# Patient Record
Sex: Male | Born: 1945 | Race: Black or African American | Hispanic: No | Marital: Single | Smoking: Former smoker
Health system: Southern US, Community
[De-identification: ages and names within clinical notes are randomized; demographics above are authoritative.]

## PROBLEM LIST (undated history)

## (undated) DIAGNOSIS — E119 Type 2 diabetes mellitus without complications: Secondary | ICD-10-CM

## (undated) DIAGNOSIS — E1165 Type 2 diabetes mellitus with hyperglycemia: Secondary | ICD-10-CM

## (undated) DIAGNOSIS — Z955 Presence of coronary angioplasty implant and graft: Secondary | ICD-10-CM

## (undated) DIAGNOSIS — M109 Gout, unspecified: Secondary | ICD-10-CM

## (undated) DIAGNOSIS — I2510 Atherosclerotic heart disease of native coronary artery without angina pectoris: Secondary | ICD-10-CM

## (undated) DIAGNOSIS — Z9861 Coronary angioplasty status: Secondary | ICD-10-CM

## (undated) DIAGNOSIS — N183 Chronic kidney disease, stage 3 (moderate): Secondary | ICD-10-CM

## (undated) DIAGNOSIS — K5792 Diverticulitis of intestine, part unspecified, without perforation or abscess without bleeding: Secondary | ICD-10-CM

## (undated) DIAGNOSIS — I213 ST elevation (STEMI) myocardial infarction of unspecified site: Secondary | ICD-10-CM

## (undated) DIAGNOSIS — I519 Heart disease, unspecified: Secondary | ICD-10-CM

## (undated) DIAGNOSIS — I10 Essential (primary) hypertension: Secondary | ICD-10-CM

## (undated) DIAGNOSIS — Z77098 Contact with and (suspected) exposure to other hazardous, chiefly nonmedicinal, chemicals: Secondary | ICD-10-CM

## (undated) HISTORY — PX: COLON SURGERY: SHX602

## (undated) HISTORY — PX: KNEE SURGERY: SHX244

---

## 1998-08-08 ENCOUNTER — Emergency Department (HOSPITAL_COMMUNITY): Admission: EM | Admit: 1998-08-08 | Discharge: 1998-08-08 | Payer: Self-pay | Admitting: Emergency Medicine

## 1998-08-13 ENCOUNTER — Emergency Department (HOSPITAL_COMMUNITY): Admission: EM | Admit: 1998-08-13 | Discharge: 1998-08-13 | Payer: Self-pay | Admitting: Emergency Medicine

## 1998-10-05 ENCOUNTER — Inpatient Hospital Stay (HOSPITAL_COMMUNITY): Admission: EM | Admit: 1998-10-05 | Discharge: 1998-10-15 | Payer: Self-pay | Admitting: Emergency Medicine

## 1998-10-05 ENCOUNTER — Encounter: Payer: Self-pay | Admitting: *Deleted

## 1998-12-18 ENCOUNTER — Encounter: Payer: Self-pay | Admitting: Family Medicine

## 1998-12-18 ENCOUNTER — Ambulatory Visit (HOSPITAL_COMMUNITY): Admission: RE | Admit: 1998-12-18 | Discharge: 1998-12-18 | Payer: Self-pay | Admitting: Family Medicine

## 1999-01-24 ENCOUNTER — Encounter: Payer: Self-pay | Admitting: Family Medicine

## 1999-01-24 ENCOUNTER — Ambulatory Visit (HOSPITAL_COMMUNITY): Admission: RE | Admit: 1999-01-24 | Discharge: 1999-01-24 | Payer: Self-pay | Admitting: Family Medicine

## 1999-03-18 ENCOUNTER — Ambulatory Visit (HOSPITAL_COMMUNITY): Admission: RE | Admit: 1999-03-18 | Discharge: 1999-03-18 | Payer: Self-pay | Admitting: Family Medicine

## 1999-03-18 ENCOUNTER — Encounter: Payer: Self-pay | Admitting: Family Medicine

## 1999-09-20 ENCOUNTER — Encounter: Payer: Self-pay | Admitting: Surgery

## 1999-09-23 ENCOUNTER — Inpatient Hospital Stay (HOSPITAL_COMMUNITY): Admission: RE | Admit: 1999-09-23 | Discharge: 1999-09-30 | Payer: Self-pay | Admitting: Surgery

## 1999-09-27 ENCOUNTER — Encounter: Payer: Self-pay | Admitting: Surgery

## 2000-03-08 ENCOUNTER — Emergency Department (HOSPITAL_COMMUNITY): Admission: EM | Admit: 2000-03-08 | Discharge: 2000-03-08 | Payer: Self-pay | Admitting: Emergency Medicine

## 2000-09-21 ENCOUNTER — Emergency Department (HOSPITAL_COMMUNITY): Admission: EM | Admit: 2000-09-21 | Discharge: 2000-09-21 | Payer: Self-pay | Admitting: Emergency Medicine

## 2001-04-19 ENCOUNTER — Emergency Department (HOSPITAL_COMMUNITY): Admission: EM | Admit: 2001-04-19 | Discharge: 2001-04-19 | Payer: Self-pay | Admitting: Emergency Medicine

## 2001-10-03 ENCOUNTER — Emergency Department (HOSPITAL_COMMUNITY): Admission: EM | Admit: 2001-10-03 | Discharge: 2001-10-03 | Payer: Self-pay | Admitting: Emergency Medicine

## 2002-03-23 ENCOUNTER — Ambulatory Visit (HOSPITAL_COMMUNITY): Admission: RE | Admit: 2002-03-23 | Discharge: 2002-03-23 | Payer: Self-pay | Admitting: Internal Medicine

## 2002-03-23 ENCOUNTER — Encounter: Payer: Self-pay | Admitting: Internal Medicine

## 2002-05-25 ENCOUNTER — Encounter: Admission: RE | Admit: 2002-05-25 | Discharge: 2002-07-01 | Payer: Self-pay | Admitting: Orthopedic Surgery

## 2003-02-28 ENCOUNTER — Emergency Department (HOSPITAL_COMMUNITY): Admission: EM | Admit: 2003-02-28 | Discharge: 2003-02-28 | Payer: Self-pay | Admitting: Emergency Medicine

## 2013-07-05 ENCOUNTER — Encounter (HOSPITAL_COMMUNITY)
Admission: EM | Disposition: A | Discharge status: Home / Self Care | Payer: Self-pay | Source: Home / Self Care | Attending: Cardiovascular Disease

## 2013-07-05 ENCOUNTER — Encounter (HOSPITAL_BASED_OUTPATIENT_CLINIC_OR_DEPARTMENT_OTHER): Payer: Self-pay | Admitting: *Deleted

## 2013-07-05 ENCOUNTER — Inpatient Hospital Stay (HOSPITAL_BASED_OUTPATIENT_CLINIC_OR_DEPARTMENT_OTHER)
Admission: EM | Admit: 2013-07-05 | Discharge: 2013-07-14 | DRG: 246 | Disposition: A | Discharge status: Home / Self Care | Payer: Non-veteran care | Attending: Cardiovascular Disease | Admitting: Cardiovascular Disease

## 2013-07-05 ENCOUNTER — Emergency Department (HOSPITAL_BASED_OUTPATIENT_CLINIC_OR_DEPARTMENT_OTHER): Payer: Non-veteran care

## 2013-07-05 DIAGNOSIS — Z77098 Contact with and (suspected) exposure to other hazardous, chiefly nonmedicinal, chemicals: Secondary | ICD-10-CM

## 2013-07-05 DIAGNOSIS — I10 Essential (primary) hypertension: Secondary | ICD-10-CM | POA: Diagnosis present

## 2013-07-05 DIAGNOSIS — IMO0001 Reserved for inherently not codable concepts without codable children: Secondary | ICD-10-CM | POA: Diagnosis present

## 2013-07-05 DIAGNOSIS — I252 Old myocardial infarction: Secondary | ICD-10-CM

## 2013-07-05 DIAGNOSIS — I200 Unstable angina: Secondary | ICD-10-CM

## 2013-07-05 DIAGNOSIS — I519 Heart disease, unspecified: Secondary | ICD-10-CM

## 2013-07-05 DIAGNOSIS — M479 Spondylosis, unspecified: Secondary | ICD-10-CM | POA: Diagnosis present

## 2013-07-05 DIAGNOSIS — I255 Ischemic cardiomyopathy: Secondary | ICD-10-CM | POA: Diagnosis present

## 2013-07-05 DIAGNOSIS — Z955 Presence of coronary angioplasty implant and graft: Secondary | ICD-10-CM

## 2013-07-05 DIAGNOSIS — I5021 Acute systolic (congestive) heart failure: Secondary | ICD-10-CM | POA: Diagnosis present

## 2013-07-05 DIAGNOSIS — Z9189 Other specified personal risk factors, not elsewhere classified: Secondary | ICD-10-CM | POA: Diagnosis present

## 2013-07-05 DIAGNOSIS — Z87891 Personal history of nicotine dependence: Secondary | ICD-10-CM

## 2013-07-05 DIAGNOSIS — I236 Thrombosis of atrium, auricular appendage, and ventricle as current complications following acute myocardial infarction: Secondary | ICD-10-CM | POA: Clinically undetermined

## 2013-07-05 DIAGNOSIS — I2109 ST elevation (STEMI) myocardial infarction involving other coronary artery of anterior wall: Principal | ICD-10-CM

## 2013-07-05 DIAGNOSIS — E1165 Type 2 diabetes mellitus with hyperglycemia: Secondary | ICD-10-CM

## 2013-07-05 DIAGNOSIS — N183 Chronic kidney disease, stage 3 unspecified: Secondary | ICD-10-CM | POA: Diagnosis present

## 2013-07-05 DIAGNOSIS — Z7982 Long term (current) use of aspirin: Secondary | ICD-10-CM

## 2013-07-05 DIAGNOSIS — I2510 Atherosclerotic heart disease of native coronary artery without angina pectoris: Secondary | ICD-10-CM

## 2013-07-05 DIAGNOSIS — I2589 Other forms of chronic ischemic heart disease: Secondary | ICD-10-CM | POA: Diagnosis present

## 2013-07-05 DIAGNOSIS — I4949 Other premature depolarization: Secondary | ICD-10-CM | POA: Diagnosis not present

## 2013-07-05 DIAGNOSIS — Z79899 Other long term (current) drug therapy: Secondary | ICD-10-CM

## 2013-07-05 DIAGNOSIS — I213 ST elevation (STEMI) myocardial infarction of unspecified site: Secondary | ICD-10-CM

## 2013-07-05 DIAGNOSIS — I129 Hypertensive chronic kidney disease with stage 1 through stage 4 chronic kidney disease, or unspecified chronic kidney disease: Secondary | ICD-10-CM | POA: Diagnosis present

## 2013-07-05 DIAGNOSIS — I249 Acute ischemic heart disease, unspecified: Secondary | ICD-10-CM

## 2013-07-05 DIAGNOSIS — Z23 Encounter for immunization: Secondary | ICD-10-CM

## 2013-07-05 DIAGNOSIS — M109 Gout, unspecified: Secondary | ICD-10-CM

## 2013-07-05 DIAGNOSIS — E663 Overweight: Secondary | ICD-10-CM | POA: Diagnosis present

## 2013-07-05 DIAGNOSIS — I5189 Other ill-defined heart diseases: Secondary | ICD-10-CM | POA: Diagnosis present

## 2013-07-05 DIAGNOSIS — I219 Acute myocardial infarction, unspecified: Secondary | ICD-10-CM

## 2013-07-05 DIAGNOSIS — Z7901 Long term (current) use of anticoagulants: Secondary | ICD-10-CM

## 2013-07-05 DIAGNOSIS — Z794 Long term (current) use of insulin: Secondary | ICD-10-CM

## 2013-07-05 DIAGNOSIS — IMO0002 Reserved for concepts with insufficient information to code with codable children: Secondary | ICD-10-CM

## 2013-07-05 DIAGNOSIS — R079 Chest pain, unspecified: Secondary | ICD-10-CM

## 2013-07-05 DIAGNOSIS — M1909 Primary osteoarthritis, other specified site: Secondary | ICD-10-CM | POA: Diagnosis present

## 2013-07-05 DIAGNOSIS — Z7739 Contact with and (suspected) exposure to other war theater: Secondary | ICD-10-CM

## 2013-07-05 HISTORY — DX: Heart disease, unspecified: I51.9

## 2013-07-05 HISTORY — DX: Essential (primary) hypertension: I10

## 2013-07-05 HISTORY — DX: ST elevation (STEMI) myocardial infarction of unspecified site: I21.3

## 2013-07-05 HISTORY — DX: Presence of coronary angioplasty implant and graft: Z95.5

## 2013-07-05 HISTORY — DX: Atherosclerotic heart disease of native coronary artery without angina pectoris: I25.10

## 2013-07-05 HISTORY — DX: Type 2 diabetes mellitus with hyperglycemia: E11.65

## 2013-07-05 HISTORY — PX: LEFT HEART CATHETERIZATION WITH CORONARY ANGIOGRAM: SHX5451

## 2013-07-05 HISTORY — DX: Contact with and (suspected) exposure to other hazardous, chiefly nonmedicinal, chemicals: Z77.098

## 2013-07-05 HISTORY — DX: Diverticulitis of intestine, part unspecified, without perforation or abscess without bleeding: K57.92

## 2013-07-05 HISTORY — DX: Gout, unspecified: M10.9

## 2013-07-05 HISTORY — DX: Type 2 diabetes mellitus without complications: E11.9

## 2013-07-05 HISTORY — DX: Reserved for concepts with insufficient information to code with codable children: IMO0002

## 2013-07-05 HISTORY — DX: Chronic kidney disease, stage 3 (moderate): N18.3

## 2013-07-05 HISTORY — PX: CORONARY ANGIOPLASTY WITH STENT PLACEMENT: SHX49

## 2013-07-05 HISTORY — DX: Coronary angioplasty status: Z98.61

## 2013-07-05 LAB — CBC
MCHC: 33.2 g/dL (ref 30.0–36.0)
Platelets: 184 10*3/uL (ref 150–400)
RDW: 13.5 % (ref 11.5–15.5)
WBC: 5.9 10*3/uL (ref 4.0–10.5)

## 2013-07-05 LAB — PROTIME-INR: Prothrombin Time: 13 seconds (ref 11.6–15.2)

## 2013-07-05 LAB — HEMOGLOBIN A1C
Hgb A1c MFr Bld: 9.9 % — ABNORMAL HIGH (ref <5.7)
Mean Plasma Glucose: 237 mg/dL — ABNORMAL HIGH (ref <117)

## 2013-07-05 LAB — GLUCOSE, CAPILLARY
Glucose-Capillary: 256 mg/dL — ABNORMAL HIGH (ref 70–99)
Glucose-Capillary: 263 mg/dL — ABNORMAL HIGH (ref 70–99)

## 2013-07-05 LAB — TROPONIN I
Troponin I: >20 ng/mL (ref <0.30)
Troponin I: >20 ng/mL (ref <0.30)
Troponin I: >20 ng/mL (ref <0.30)

## 2013-07-05 LAB — COMPREHENSIVE METABOLIC PANEL
AST: 17 U/L (ref 0–37)
Albumin: 4 g/dL (ref 3.5–5.2)
Alkaline Phosphatase: 122 U/L — ABNORMAL HIGH (ref 39–117)
BUN: 20 mg/dL (ref 6–23)
Chloride: 106 mEq/L (ref 96–112)
Potassium: 3.4 mEq/L — ABNORMAL LOW (ref 3.5–5.1)
Sodium: 141 mEq/L (ref 135–145)
Total Protein: 7.8 g/dL (ref 6.0–8.3)

## 2013-07-05 LAB — CK TOTAL AND CKMB (NOT AT ARMC)
CK, MB: 239.7 ng/mL (ref 0.3–4.0)
Relative Index: 3.5 — ABNORMAL HIGH (ref 0.0–2.5)

## 2013-07-05 LAB — APTT: aPTT: 27 seconds (ref 24–37)

## 2013-07-05 LAB — POCT ACTIVATED CLOTTING TIME: Activated Clotting Time: 493 seconds

## 2013-07-05 SURGERY — LEFT HEART CATHETERIZATION WITH CORONARY ANGIOGRAM
Anesthesia: LOCAL

## 2013-07-05 MED ORDER — TICAGRELOR 90 MG PO TABS
90.0000 mg | ORAL_TABLET | Freq: Two times a day (BID) | ORAL | Status: DC
  Filled 2013-07-05 (×2): qty 1

## 2013-07-05 MED ORDER — HEPARIN (PORCINE) IN NACL 100-0.45 UNIT/ML-% IJ SOLN
INTRAMUSCULAR | Status: AC
  Administered 2013-07-05: 06:00:00
  Filled 2013-07-05: qty 250

## 2013-07-05 MED ORDER — NITROGLYCERIN 0.2 MG/ML ON CALL CATH LAB
INTRAVENOUS | Status: AC
  Filled 2013-07-05: qty 1

## 2013-07-05 MED ORDER — ONDANSETRON HCL 4 MG/2ML IJ SOLN
4.0000 mg | Freq: Four times a day (QID) | INTRAMUSCULAR | Status: DC | PRN
  Administered 2013-07-05: 4 mg via INTRAVENOUS
  Filled 2013-07-05 (×2): qty 2

## 2013-07-05 MED ORDER — SODIUM CHLORIDE 0.9 % IV SOLN
INTRAVENOUS | Status: DC
  Administered 2013-07-05: 10 mL/h via INTRAVENOUS

## 2013-07-05 MED ORDER — HEPARIN SODIUM (PORCINE) 5000 UNIT/ML IJ SOLN
60.0000 [IU]/kg | INTRAMUSCULAR | Status: AC
  Administered 2013-07-05: 60 [IU] via INTRAVENOUS

## 2013-07-05 MED ORDER — ASPIRIN 81 MG PO CHEW
81.0000 mg | CHEWABLE_TABLET | Freq: Every day | ORAL | Status: DC
  Administered 2013-07-06 – 2013-07-14 (×9): 81 mg via ORAL
  Filled 2013-07-05 (×9): qty 1

## 2013-07-05 MED ORDER — INSULIN ASPART 100 UNIT/ML ~~LOC~~ SOLN
0.0000 [IU] | Freq: Three times a day (TID) | SUBCUTANEOUS | Status: DC
  Administered 2013-07-05: 8 [IU] via SUBCUTANEOUS
  Administered 2013-07-05: 5 [IU] via SUBCUTANEOUS
  Administered 2013-07-06 (×2): 3 [IU] via SUBCUTANEOUS
  Administered 2013-07-06: 2 [IU] via SUBCUTANEOUS
  Administered 2013-07-07 (×3): 3 [IU] via SUBCUTANEOUS
  Administered 2013-07-08 (×2): 2 [IU] via SUBCUTANEOUS
  Administered 2013-07-09: 3 [IU] via SUBCUTANEOUS
  Administered 2013-07-11 (×2): 2 [IU] via SUBCUTANEOUS
  Administered 2013-07-12: 4 [IU] via SUBCUTANEOUS
  Administered 2013-07-12 – 2013-07-14 (×2): 2 [IU] via SUBCUTANEOUS

## 2013-07-05 MED ORDER — ASPIRIN 81 MG PO CHEW
324.0000 mg | CHEWABLE_TABLET | Freq: Once | ORAL | Status: AC
  Administered 2013-07-05: 324 mg via ORAL

## 2013-07-05 MED ORDER — NITROGLYCERIN IN D5W 200-5 MCG/ML-% IV SOLN: 2.0000 ug/min | INTRAVENOUS | Status: DC

## 2013-07-05 MED ORDER — METHOCARBAMOL 750 MG PO TABS
750.0000 mg | ORAL_TABLET | Freq: Two times a day (BID) | ORAL | Status: DC | PRN
  Administered 2013-07-06 – 2013-07-09 (×2): 750 mg via ORAL
  Filled 2013-07-05 (×2): qty 1

## 2013-07-05 MED ORDER — ALUM & MAG HYDROXIDE-SIMETH 200-200-20 MG/5ML PO SUSP
30.0000 mL | ORAL | Status: DC | PRN
  Administered 2013-07-05: 30 mL via ORAL
  Filled 2013-07-05: qty 30

## 2013-07-05 MED ORDER — ALLOPURINOL 100 MG PO TABS
100.0000 mg | ORAL_TABLET | Freq: Every day | ORAL | Status: DC
  Administered 2013-07-05 – 2013-07-14 (×10): 100 mg via ORAL
  Filled 2013-07-05 (×10): qty 1

## 2013-07-05 MED ORDER — ATORVASTATIN CALCIUM 80 MG PO TABS
80.0000 mg | ORAL_TABLET | Freq: Every day | ORAL | Status: DC
  Administered 2013-07-05 – 2013-07-13 (×10): 80 mg via ORAL
  Filled 2013-07-05 (×10): qty 1

## 2013-07-05 MED ORDER — ACETAMINOPHEN 325 MG PO TABS: 650.0000 mg | ORAL_TABLET | ORAL | Status: DC | PRN

## 2013-07-05 MED ORDER — POTASSIUM CHLORIDE 10 MEQ/100ML IV SOLN
INTRAVENOUS | Status: AC
  Filled 2013-07-05: qty 100

## 2013-07-05 MED ORDER — TICAGRELOR 90 MG PO TABS
ORAL_TABLET | ORAL | Status: AC
  Filled 2013-07-05: qty 2

## 2013-07-05 MED ORDER — INSULIN ASPART 100 UNIT/ML ~~LOC~~ SOLN: 0.0000 [IU] | Freq: Every day | SUBCUTANEOUS | Status: DC

## 2013-07-05 MED ORDER — BIVALIRUDIN 250 MG IV SOLR
INTRAVENOUS | Status: AC
  Filled 2013-07-05: qty 250

## 2013-07-05 MED ORDER — SODIUM CHLORIDE 0.9 % IV SOLN
0.2500 mg/kg/h | INTRAVENOUS | Status: AC
  Filled 2013-07-05: qty 250

## 2013-07-05 MED ORDER — MIDAZOLAM HCL 2 MG/2ML IJ SOLN
INTRAMUSCULAR | Status: AC
  Filled 2013-07-05: qty 2

## 2013-07-05 MED ORDER — HYDROCODONE-ACETAMINOPHEN 5-325 MG PO TABS
1.0000 | ORAL_TABLET | Freq: Four times a day (QID) | ORAL | Status: DC | PRN
  Administered 2013-07-05 – 2013-07-13 (×9): 1 via ORAL
  Filled 2013-07-05 (×3): qty 1
  Filled 2013-07-05: qty 2
  Filled 2013-07-05: qty 1
  Filled 2013-07-05: qty 2
  Filled 2013-07-05 (×2): qty 1
  Filled 2013-07-05: qty 2

## 2013-07-05 MED ORDER — HEPARIN (PORCINE) IN NACL 2-0.9 UNIT/ML-% IJ SOLN
INTRAMUSCULAR | Status: AC
  Filled 2013-07-05: qty 1500

## 2013-07-05 MED ORDER — SODIUM CHLORIDE 0.9 % IV SOLN: INTRAVENOUS | Status: AC

## 2013-07-05 MED ORDER — FUROSEMIDE 10 MG/ML IJ SOLN
INTRAMUSCULAR | Status: AC
  Filled 2013-07-05: qty 4

## 2013-07-05 MED ORDER — NITROGLYCERIN 0.4 MG SL SUBL
0.4000 mg | SUBLINGUAL_TABLET | SUBLINGUAL | Status: DC | PRN
  Administered 2013-07-05: 0.4 mg via SUBLINGUAL
  Filled 2013-07-05: qty 25

## 2013-07-05 MED ORDER — LIDOCAINE HCL (PF) 1 % IJ SOLN
INTRAMUSCULAR | Status: AC
  Filled 2013-07-05: qty 30

## 2013-07-05 MED ORDER — POTASSIUM CHLORIDE CRYS ER 20 MEQ PO TBCR
20.0000 meq | EXTENDED_RELEASE_TABLET | Freq: Once | ORAL | Status: AC
  Administered 2013-07-05: 20 meq via ORAL
  Filled 2013-07-05: qty 1

## 2013-07-05 MED ORDER — FUROSEMIDE 10 MG/ML IJ SOLN
20.0000 mg | Freq: Once | INTRAMUSCULAR | Status: AC
  Administered 2013-07-05: 20 mg via INTRAVENOUS

## 2013-07-05 MED ORDER — MORPHINE SULFATE 2 MG/ML IJ SOLN: 1.0000 mg | INTRAMUSCULAR | Status: DC | PRN

## 2013-07-05 MED ORDER — METOPROLOL TARTRATE 12.5 MG HALF TABLET
12.5000 mg | ORAL_TABLET | Freq: Two times a day (BID) | ORAL | Status: DC
  Administered 2013-07-05 – 2013-07-07 (×6): 12.5 mg via ORAL
  Filled 2013-07-05 (×8): qty 1

## 2013-07-05 MED ORDER — TICAGRELOR 90 MG PO TABS
90.0000 mg | ORAL_TABLET | Freq: Two times a day (BID) | ORAL | Status: DC
  Administered 2013-07-05 – 2013-07-14 (×18): 90 mg via ORAL
  Filled 2013-07-05 (×19): qty 1

## 2013-07-05 MED ORDER — FENTANYL CITRATE 0.05 MG/ML IJ SOLN
INTRAMUSCULAR | Status: AC
  Filled 2013-07-05: qty 2

## 2013-07-05 MED ORDER — ASPIRIN 81 MG PO CHEW
CHEWABLE_TABLET | ORAL | Status: AC
  Administered 2013-07-05: 324 mg via ORAL
  Filled 2013-07-05: qty 4

## 2013-07-05 MED ORDER — GLIPIZIDE 10 MG PO TABS
10.0000 mg | ORAL_TABLET | Freq: Two times a day (BID) | ORAL | Status: DC
  Administered 2013-07-05 – 2013-07-14 (×18): 10 mg via ORAL
  Filled 2013-07-05 (×20): qty 1

## 2013-07-05 NOTE — ED Provider Notes (Signed)
CSN: [id number]     Arrival date & time 07/05/13  0506 History   First MD Initiated Contact with Patient 07/05/13 [phone]     Chief Complaint  Patient presents with  . Chest Pain   (Consider location/radiation/quality/duration/timing/severity/associated sxs/prior Treatment) Patient is a 67 y.o. male presenting with chest pain. The history is provided by the patient and a relative.  Chest Pain Associated symptoms: nausea and shortness of breath   Associated symptoms: no abdominal pain, no back pain, no cough, no fever, no headache, no numbness, no palpitations and no weakness   pt with hx dm, htn, c/o acute onset mid chest pain early this morning while sleeping.  States had mild cp last pm prior to going to bed. Approximately 3 am awoke w acute worsening of cp. Mid to lower sternal area, dull, crushing, not pleuritic. No tearing/ripping sensations. No back or neck pain. +sob. +nv. No numbness/weakness. Denies hx same pain. Denies hx cad, but states siblings and parents w heart disease. No fever or chills. No cough or uri c/o. No leg pain or swelling. Since 0300, pain constant, mod-severe. Pt unaware of specific exacerbating or alleviating factors regarding his pain.      Past Medical History  Diagnosis Date  . Diabetes mellitus without complication   . Hypertension   . Diverticulitis    Past Surgical History  Procedure Laterality Date  . Knee surgery     No family history on file. History  Substance Use Topics  . Smoking status: Former Games developer  . Smokeless tobacco: Never Used  . Alcohol Use: No    Review of Systems  Constitutional: Negative for fever.  HENT: Negative for neck pain.   Eyes: Negative for visual disturbance.  Respiratory: Positive for shortness of breath. Negative for cough.   Cardiovascular: Positive for chest pain. Negative for palpitations and leg swelling.  Gastrointestinal: Positive for nausea. Negative for abdominal pain.  Genitourinary: Negative for flank  pain.  Musculoskeletal: Negative for back pain.  Skin: Negative for rash.  Neurological: Negative for weakness, numbness and headaches.  Hematological: Does not bruise/bleed easily.  Psychiatric/Behavioral: Negative for confusion.    Allergies  Review of patient's allergies indicates not on file.  Home Medications  No current outpatient prescriptions on file. BP 122/76  Pulse 88  Temp(Src) 97.4 F (36.3 C) (Oral)  Resp 24  SpO2 100% Physical Exam  Nursing note and vitals reviewed. Constitutional: He is oriented to person, place, and time. He appears well-developed and well-nourished. He appears distressed.  HENT:  Head: Atraumatic.  Mouth/Throat: Oropharynx is clear and moist.  Eyes: Conjunctivae are normal.  Neck: Neck supple. No tracheal deviation present.  Cardiovascular: Normal rate, regular rhythm, normal heart sounds and intact distal pulses.  Exam reveals no gallop and no friction rub.   No murmur heard. Pulmonary/Chest: Effort normal and breath sounds normal. No accessory muscle usage. No respiratory distress.  Abdominal: Soft. He exhibits no distension and no mass. There is no tenderness. There is no rebound and no guarding.  Musculoskeletal: Normal range of motion. He exhibits no edema and no tenderness.  Neurological: He is alert and oriented to person, place, and time.  Skin: Skin is warm. He is diaphoretic.  Psychiatric: He has a normal mood and affect.    ED Course  Procedures (including critical care time) Labs Review Labs Reviewed  APTT  CBC  COMPREHENSIVE METABOLIC PANEL  PROTIME-INR  TROPONIN I   Imaging Review No results found.  MDM  Iv ns.  O2, monitor. Ecg. Pcxr. Labs.  Code stemi called.   Asa.   Discussed pt with Dr Tresa Endo who accepts in transfer to [facility]. EMS called for transport.   Date: 07/05/2013  Rate: 88  Rhythm: normal sinus rhythm  QRS Axis: normal  Intervals: normal  ST/T Wave abnormalities: ST elevations anteriorly   Conduction Disutrbances:none  Narrative Interpretation:   Old EKG Reviewed: changes noted 2-4 mm st elev v2-v4 new,changed from prior ecg.  Ntg, ntg gtt (pt states has rx for viagra, but hasnt taken any in the past week).   Pt to go directly to cath lab at [facility], pt agreeable to plan.      Suzi Roots, MD 07/05/13 [phone]

## 2013-07-05 NOTE — ED Notes (Signed)
Pt reports chest pain, n/v and SOB since 0300

## 2013-07-05 NOTE — Progress Notes (Signed)
52fr. sheath removed from rt. Groin, groin level 0, distal pulse present. Vitals remained stable throughout 30 minute hold. Tegaderm dressing applied to site and pulses remain the same post sheath pull. M.Wright RT-R

## 2013-07-05 NOTE — CV Procedure (Signed)
Ralph Short is a 67 y.o. male    [id number] LOCATION:  FACILITY: [facility]  PHYSICIAN: Nanetta Batty, M.D. 1946/01/15   DATE OF PROCEDURE:  07/05/2013  DATE OF DISCHARGE:   CARDIAC CATHETERIZATION / PCI    History obtained from chart review. Ralph Short is a 67 year old moderately overweight in American male who lives in [place]. He has a history of hypertension and diabetes. He does not smoke. He is no prior history of heart disease. He has never had a heart attack or stroke. He developed new onset chest pain beginning at one to 2 AM this morning. He was brought to [facility] where he was found to have anterior ST segment elevation. He was transferred by EMS to [facility] for urgent intervention.   PROCEDURE DESCRIPTION:    The patient was brought to the second floor  Cardiac cath lab in the postabsorptive state. He was premedicated with IV Versed and fentanyl. His right groinwas prepped and shaved in usual sterile fashion. Xylocaine 1% was used for local anesthesia. A 6 French sheath was inserted into the right common femoral artery using standard Seldinger technique.6 French right and left Judkins diagnostic catheters along with a 6 French pigtail catheter were used for selective coronary angiography and left ventriculography respectively. Visipaque dye was used for the entirety of the case. Retrograde aorta, left ventricular and pullback pressures were recorded.   HEMODYNAMICS:    AO SYSTOLIC/AO DIASTOLIC: 96/66   LV SYSTOLIC/LV DIASTOLIC: 93/29  ANGIOGRAPHIC RESULTS:   1. Left main; normal  2. LAD; occluded in its proximal portion. This was the infarct related artery 3. Left circumflex; nondominant and free of significant disease.  4. Right coronary artery; dominant with minor irregularities. The RCA gave off grade 0-1 collaterals to the occluded LAD 5. Left ventriculography; RAO left ventriculogram was performed using  25 mL of Visipaque dye at 12 mL/second.  The overall LVEF estimated  35-40 %  With wall motion abnormalities notable for Anterior/ apical akinesia  IMPRESSION:Ralph Short has an occluded proximal LAD in the setting of an anterior STEMI. We will proceed with PCI and stenting with drug-eluting stent, Angiomax, aspirin and Brilenta.  Procedure description: The patient received a total of 170 cc of contrast. He received intermixed bolus with an ACT of 493. He received Brilenta  loading dose of 180 mg. The door to balloon time (D2BT) was 25 minutes. Using a 6 [place] XB 3.5 LAD guiding catheter along with a 1/190 cm long pro-water guidewire an 2 mm x 12 mm balloon angioplasty was performed establishing antegrade flow. Following this, the wire was redirected from the diagonal branch down the LAD. The proximal LAD was then stented with a 3 mm x 18 mm long Xpedition  drug-eluting stent at 16 atmospheres. This was post dilated with a 3.5 mm x 15 mm long noncompliant balloon at 16 atmospheres (3.6 mm) resulting in reduction of a total occlusion to 0% residual with TIMI-3 flow. Angiography then revealed an 80% segmental mid LAD which was primarily stented using a 2.5 x 18 mL long Xpedition  DES stent at 15 atmospheres (2.71 mm)  resulting in reduction of an 80% stenosis to 0% residual.  Final impression: emergency PCI and stenting in the setting of anterior STEMI a total proximal LAD with excellent angiographic result and a door to balloon time of 25 minutes. The patient does have chronic renal insufficiency with a creatinine of 1.9. His renal function will be carefully monitored given his contrast load.  He'll be treated with the usual standard of care medications including beta blocker, statin drug. ACE inhibitors will be held because of his renal insufficiency. He left the Cath Lab in stable condition.  Runell Gess MD, [facility] 07/05/2013 7:21 AM

## 2013-07-05 NOTE — H&P (Signed)
Ralph Short is an 67 y.o. male.    Primary Cardiologist:NEW Dr. Allyson Sabal No primary provider on file.---Dr. Leonides Schanz [facility]  Chief Complaint: chest pain, N & V, diaphoresis, SOB  HPI: 65 YOAAM was awakened at 0140 AM with acute chest pain, "heavy pressure on my chest"  this was associated with N, V, diaphoresis, and SOB.  He called his son and EMS, brought to the ER and found to have EKG with ST elevation of 5-6 mm in V2-4, Code STEMI was called and pt went emergently to the cath lab.  He was given ASA 324 in ER.  Pt is followed at the [place] and is on 100% disability from [employer], he was exposed to during the [place] war.  He was placed on insulin last week for diabetes.    Past Medical History  Diagnosis Date  . Diabetes mellitus without complication   . Hypertension   . Diverticulitis   . Diverticulitis   . Gout 07/05/2013  . DM (diabetes mellitus), type 2, uncontrolled, recently began insulin 07/05/2013  . History of agent Orange exposure 07/05/2013    Past Surgical History  Procedure Laterality Date  . Knee surgery    . Colon surgery      Family History  Problem Relation Age of Onset  . Alzheimer's disease Mother   . Hyperlipidemia Mother   . Hypertension Mother   . Diabetes type II Mother   . Hypertension Father   . Alzheimer's disease Father   . Diabetes type II Father   . Diabetes type II Sister   . Hypertension Sister   . Heart disease Sister   . Heart disease Brother   . Hypertension Brother   . Diabetes type II Brother   . Diabetes type II Sister   . Hypertension Sister   . Diabetes type II Sister   . Hypertension Sister   . Heart disease Sister   . Diabetes type II Sister   . Hypertension Sister   . Diabetes type II Sister   . Hypertension Sister   . Diabetes type II Sister   . Hypertension Sister   . Hypertension Brother   . Diabetes type II Brother   . Hypertension Brother   . Diabetes type II Brother   . Spina bifida Son    Social  History:  reports that he quit smoking about 20 years ago. He has never used smokeless tobacco. He reports that he does not drink alcohol or use illicit drugs. 2 of his 5  Children live with him, one with spina bifida that is in a wheelchair.     Allergies:  Allergies  Allergen Reactions  . Penicillins     Medications Prior to Admission  Medication Sig Dispense Refill  . ALLOPURINOL PO Take by mouth.      Marland Kitchen HYDROCODONE-ACETAMINOPHEN PO Take by mouth.      . Sildenafil Citrate (VIAGRA PO) Take by mouth.      Also colchicine, insulin including Lantus and one he does not remember.  Not sure of all of his meds currently.   Results for orders placed during the hospital encounter of 07/05/13 (from the past 48 hour(s))  APTT     Status: None   Collection Time    07/05/13  5:29 AM      Result Value Range   aPTT 27  24 - 37 seconds  CBC     Status: None   Collection Time  07/05/13  5:29 AM      Result Value Range   WBC 5.9  4.0 - 10.5 K/uL   RBC 4.30  4.22 - 5.81 MIL/uL   Hemoglobin 13.4  13.0 - 17.0 g/dL   HCT 21.3  08.6 - 57.8 %   MCV 94.0  78.0 - 100.0 fL   MCH 31.2  26.0 - 34.0 pg   MCHC 33.2  30.0 - 36.0 g/dL   RDW 46.9  62.9 - 52.8 %   Platelets 184  150 - 400 K/uL  COMPREHENSIVE METABOLIC PANEL     Status: Abnormal   Collection Time    07/05/13  5:29 AM      Result Value Range   Sodium 141  135 - 145 mEq/L   Potassium 3.4 (*) 3.5 - 5.1 mEq/L   Chloride 106  96 - 112 mEq/L   CO2 22  19 - 32 mEq/L   Glucose, Bld 312 (*) 70 - 99 mg/dL   BUN 20  6 - 23 mg/dL   Creatinine, Ser 4.13 (*) 0.50 - 1.35 mg/dL   Calcium 9.9  8.4 - 24.4 mg/dL   Total Protein 7.8  6.0 - 8.3 g/dL   Albumin 4.0  3.5 - 5.2 g/dL   AST 17  0 - 37 U/L   ALT 17  0 - 53 U/L   Alkaline Phosphatase 122 (*) 39 - 117 U/L   Total Bilirubin 0.2 (*) 0.3 - 1.2 mg/dL   GFR calc non Af Amer 35 (*) >90 mL/min   GFR calc Af Amer 40 (*) >90 mL/min   Comment: (NOTE)     The eGFR has been calculated using the CKD  EPI equation.     This calculation has not been validated in all clinical situations.     eGFR's persistently <90 mL/min signify possible Chronic Kidney     Disease.  PROTIME-INR     Status: None   Collection Time    07/05/13  5:29 AM      Result Value Range   Prothrombin Time 13.0  11.6 - 15.2 seconds   INR 1.00  0.00 - 1.49  TROPONIN I     Status: None   Collection Time    07/05/13  5:29 AM      Result Value Range   Troponin I <0.30  <0.30 ng/mL   Comment:            Due to the release kinetics of cTnI,     a negative result within the first hours     of the onset of symptoms does not rule out     myocardial infarction with certainty.     If myocardial infarction is still suspected,     repeat the test at appropriate intervals.  GLUCOSE, CAPILLARY     Status: Abnormal   Collection Time    07/05/13  7:22 AM      Result Value Range   Glucose-Capillary 256 (*) 70 - 99 mg/dL   Dg Chest Port 1 View  07/05/2013   *RADIOLOGY REPORT*  Clinical Data: Chest pain.  PORTABLE CHEST - 1 VIEW  Comparison: None.  Findings: No cardiomegaly.  Upper mediastinal contours within normal limits for portable exam.  Minimal linear opacities at the bases, likely atelectasis or scarring.  Negative for edema, effusion, consolidation, or pneumothorax.  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  IMPRESSION:  No evidence of acute cardiopulmonary disease.   Original Report Authenticated By: Christiane Ha  Watts    ROS: General:no colds or fevers, no weight changes Skin:no rashes or ulcers HEENT:no blurred vision, no congestion CV:see HPI PUL:see HPI GI:no diarrhea constipation or melena, no indigestion GU:no hematuria, no dysuria MS:+ joint pain with gout and arthritis, no claudication Neuro:no syncope, no lightheadedness Endo:+ diabetes, no thyroid disease   Blood pressure 120/79, pulse 88, temperature 97.4 F (36.3 C), temperature source Oral, resp. rate 26, height 5\' 10"   (1.778 m), weight 232 lb (105.235 kg), SpO2 100.00%. PE: General:Pleasant affect, NAD Skin:Warm and dry, brisk capillary refill HEENT:normocephalic, sclera clear, mucus membranes moist, pupils equal Neck:supple, no JVD, no bruits, no adenopathy  Heart:S1S2 RRR without murmur, gallup, rub or click Lungs:clear without rales, rhonchi, or wheezes, ant WUJ:WJXB, non tender, + BS, do not palpate liver spleen or masses Ext:no lower ext edema, 1+ pedal pulses, 2+ radial pulses Neuro:alert and oriented, MAE, follows commands, + facial symmetry    Assessment/Plan Principal Problem:   STEMI (ST elevation myocardial infarction)of ANT wall-total occ. 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 Leads.  Follow troponin Is, SSI.  Will hold ACE/ARB for now due to elevated Cr.  Hope to begin tomorrow.  Will check echo tomorrow to see if improved EF.  [facility] Nurse Practitioner Certified [facility] Pager [phone] 07/05/2013, 7:59 AM    Agree with note written by Nada Boozer RNP  Admitted with Ant STEMI. Successful revascularization. + CRF. Currently pain free. Exam benign. DAPT at least 1 year. Prob transfer to [facility], tele on Thurs and home Fri. Hold ACE/ARB. Follow renal fxn.   Runell Gess 07/05/2013 10:51 AM

## 2013-07-05 NOTE — ED Notes (Signed)
Patient arrived to [facility] via [facility] as a CODE STEMI. Cath lab phoned at patient arrival to inform that they ready. Patient still on EMS stretcher. AAOx4, resp e/u, NAD noted. Unable to place patient on zoll pads, obtain vitals or assess any further. Patient transported to cath lab by EMS.

## 2013-07-05 NOTE — Progress Notes (Signed)
Chaplain responded to page concerning code stemi. Chaplain reported to ED where patient was admitted, but patient did not have family present. Patient was sent to cath lab.

## 2013-07-05 NOTE — H&P (Signed)
    Pt was reexamined and existing H & P reviewed. No changes found.  Runell Gess, MD [facility] 07/05/2013 6:17 AM

## 2013-07-05 NOTE — Progress Notes (Signed)
Pt is complaining of shortness of breath and nausea. On o2 spo2 is 100% however pt has developed crackles in the left and right lung bases.  Nada Boozer, NP was called and new orders were received.  Zofran 4 mg was given iv per prn orders, iv fluid was reduced to kvo and lasix 20 mg was given iv per Nada Boozer. Will cont to monitor.

## 2013-07-06 ENCOUNTER — Encounter (HOSPITAL_COMMUNITY): Payer: Self-pay | Admitting: Cardiology

## 2013-07-06 DIAGNOSIS — I059 Rheumatic mitral valve disease, unspecified: Secondary | ICD-10-CM

## 2013-07-06 DIAGNOSIS — N183 Chronic kidney disease, stage 3 unspecified: Secondary | ICD-10-CM

## 2013-07-06 DIAGNOSIS — Z9861 Coronary angioplasty status: Secondary | ICD-10-CM

## 2013-07-06 HISTORY — DX: Chronic kidney disease, stage 3 unspecified: N18.30

## 2013-07-06 LAB — BASIC METABOLIC PANEL
CO2: 20 mEq/L (ref 19–32)
Calcium: 8.9 mg/dL (ref 8.4–10.5)
GFR calc non Af Amer: 44 mL/min — ABNORMAL LOW (ref >90)
Potassium: 3.5 mEq/L (ref 3.5–5.1)
Sodium: 137 mEq/L (ref 135–145)

## 2013-07-06 LAB — CBC
MCH: 31 pg (ref 26.0–34.0)
MCHC: 34.5 g/dL (ref 30.0–36.0)
Platelets: 169 10*3/uL (ref 150–400)
RBC: 4.16 MIL/uL — ABNORMAL LOW (ref 4.22–5.81)

## 2013-07-06 LAB — GLUCOSE, CAPILLARY
Glucose-Capillary: 163 mg/dL — ABNORMAL HIGH (ref 70–99)
Glucose-Capillary: 164 mg/dL — ABNORMAL HIGH (ref 70–99)
Glucose-Capillary: 129 mg/dL — ABNORMAL HIGH (ref 70–99)
Glucose-Capillary: 118 mg/dL — ABNORMAL HIGH (ref 70–99)

## 2013-07-06 LAB — TSH: TSH: 1.054 u[IU]/mL (ref 0.350–4.500)

## 2013-07-06 LAB — LIPID PANEL
LDL Cholesterol: 81 mg/dL (ref 0–99)
Total CHOL/HDL Ratio: 4.7 RATIO
VLDL: 27 mg/dL (ref 0–40)

## 2013-07-06 LAB — TROPONIN I: Troponin I: >20 ng/mL (ref <0.30)

## 2013-07-06 MED FILL — Sodium Chloride IV Soln 0.9%: INTRAVENOUS | Qty: 50 | Status: AC

## 2013-07-06 NOTE — Progress Notes (Signed)
  Echocardiogram 2D Echocardiogram has been performed.  Georgian Co 07/06/2013, 12:17 PM

## 2013-07-06 NOTE — Care Management Note (Addendum)
Page 1 of 2   07/14/2013     3:08:28 PM   CARE MANAGEMENT NOTE 07/14/2013  Patient:  Ralph Short, Ralph Short   Account Number:  [id number]  Date Initiated:  07/05/2013  Documentation initiated by:  Junius Creamer  Subjective/Objective Assessment:   adm w mi     Action/Plan:   lives w sign other   Anticipated DC Date:  07/11/2013   Anticipated DC Plan:  HOME/SELF CARE      DC Planning Services  CM consult  [facility] referrals / transfers      Choice offered to / List presented to:             Status of service:  Completed, signed off Medicare Important Message given?   (If response is "NO", the following Medicare IM given date fields will be blank) Date Medicare IM given:   Date Additional Medicare IM given:    Discharge Disposition:  HOME/SELF CARE  Per UR Regulation:  Reviewed for med. necessity/level of care/duration of stay  If discussed at Long Length of Stay Meetings, dates discussed:   07/05/2013  07/14/2013    Comments:  07/14/13 Taiwan Talcott,RN,BSN [phone] SPOKE WITH TASHA MOOSE, PHARMACIST AT [facility]:(PHONE [phone], EXT [phone]),   SHE UNDERSTANDS PT NEEDS TO PICK UP MEDS AT [facility] TODAY, AS HE HAS LOVENOX DOSE DUE THIS EVENING.  FAXED ALL DC RX TO MS. MOOSE AT [facility] AT [phone].  WILL FAX AVS TO DR PERRY'S OFFICE, FOR FOLLOW UP(FAX [phone]).  PT TO HAVE PT/INR CHECKED TOMORROW AM AT [facility], PER ARRANGEMENTS.  PT AGREEABLE TO THIS.  07/14/13 Felice Hope,RN,BSN [phone] LIFEVEST APPROVED BY [facility] THIS MORNING.  [facility] REP TO EDUCATE AND FIT PT FOR LIFEVEST AROUND NOON TODAY.  INFORMED PT; HE IS APPRECIATIVE OF ALL HELP GIVEN TO ACCOMPLISH THIS.  07/12/13 Emalene Welte,RN,BSN [phone] SPOKE WITH ASHLEY FROM [facility]; STILL NO APPROVAL FROM [facility] FOR LIFEVEST.  SPOKE WITH PT''S SOCIAL WORKER AT [place], BERTTINA DUNCAN TODAY [phone], EXT [phone]); SHE CONT TO WORK WITH PT'S PRIMARY AND [facility] REP TO GET APPROVAL FOR DEVICE.  MR Dorman IS FRUSTRATED, STATING THAT "THE  DOCTOR TOLD ME I COULD LEAVE THE HOSPITAL WITHOUT IT."  HOPEFUL FOR APPROVAL TOMORROW, BUT WITH VA'S HX, UNCERTAIN THAT THIS WILL HAPPEN.  WILL CONT TO FOLLOW/ASSIST WITH DC PLANNING.  07-08-13 11am Avie Arenas, RNBSN - [phone] Valora Piccolo returned call.  Coumadin consult has been placed - will need to call back for appts.  Will need Short life vest on discharge - Morrie Sheldon from [place] aware and working on - [place] suggested he rworking with Maylon Cos at [phone] ext [phone]. Updated Morrie Sheldon.   Patient can go to cardiologist here for f/u per [facility] should be covered under medicare.  Will need to give pateint choice.  For prescriptions need to call [phone] - ext [phone]. Updated April at [facility] [place] on patients condition and plan for discharge - possibly early next week.  Patient does not want to be with [facility]- wants to have Cardiologist here. States does not haveMedicare benefit, but [facility] will pay...Marland KitchenMarland KitchenMarland Kitchenhe states he will fight this with the VA when he gets discharged.  07/07/13 Erika Hussar,RN,BSN [phone]  1640 SHARON GRAY FROM DR PERRY'S OFFICE CALLED BACK ; STATES SHE WILL CALL THIS CASE MGR BACK AFTER SPEAKING WITH DR ABOUT COUMADIN FOLLOW UP.  07/07/13 Tarun Patchell,RN,BSN [phone] [facility]; PRIMARY CARE IS DR Marina Goodell.  PT [facility]CE[facility]  PT WILL NEED COUMADIN FOLLOW UP AT DC, AND PT/INR MONITORING.  SPOKE WITH DR [employer] SECRETARY AT [facility]. SHE STATES SHE WILL LEAVE Short MESSAGE FOR VERA JONES, DR PERRY'S NURSE, REGARDING ESTABLISHING Short PHARM-D FOR PT TO DO COUMADIN FOLLOW UP.  WILL ALSO LEAVE Short MESSAGE FOR BERTINA DUNCANS, PT'S SOCIAL WORKER TO ASSIST WITH FOLLOW UP.  ([phone], EXT. [phone]).  9/4 1010 debbie dowell rn,bsn have alerted april w [facility], reviews sent 07/06/13 1449 debbie dowell rn,bsn pt has 30day free and copay assist card for brilinta. have tried on 9-2 and 9-3 to get thru to pharm and [facility] to [facility]k on brilinta but  have had no success speaking w anyone.

## 2013-07-06 NOTE — Progress Notes (Signed)
CARDIAC REHAB PHASE I   PRE:  Rate/Rhythm: 85 SR    BP: sitting 100/71    SaO2:   MODE:  Ambulation: 350 ft   POST:  Rate/Rhythm: 100 ST    BP: sitting 109/77     SaO2:   tolerated well.  No c/o, glad to be walking. Very talkative. Ed began. Needs diet work. To read handouts when he gets his glasses. Hard to keep him focused. Will f/u.  [id number]  Elissa Lovett Jeff CES, ACSM 07/06/2013 11:12 AM

## 2013-07-06 NOTE — Progress Notes (Addendum)
Subjective: Breathing a lot better after lasix.  maalox helped indigestion from yesterday.  Objective: Vital signs in last 24 hours: Temp:  [97.8 F (36.6 C)-99 F (37.2 C)] 98.9 F (37.2 C) (09/03 0035) Pulse Rate:  [74-102] 74 (09/02 1700) Resp:  [13-24] 20 (09/03 0300) BP: (89-131)/(55-89) 96/58 mmHg (09/03 0300) SpO2:  [93 %-99 %] 98 % (09/03 0035) Weight:  [238 lb 8.6 oz (108.2 kg)] 238 lb 8.6 oz (108.2 kg) (09/02 0827) Last BM Date: 07/05/13  Intake/Output from previous day: 09/02 0701 - 09/03 0700 In: 1603.5 [P.O.:960; I.V.:643.5] Out: 3800 [Urine:3800] Intake/Output this shift:    Medications Current Facility-Administered Medications  Medication Dose Route Frequency Provider Last Rate Last Dose  . acetaminophen (TYLENOL) tablet 650 mg  650 mg Oral Q4H PRN Runell Gess, MD      . allopurinol (ZYLOPRIM) tablet 100 mg  100 mg Oral Daily Nada Boozer, NP   100 mg at 07/05/13 1142  . alum & mag hydroxide-simeth (MAALOX/MYLANTA) 200-200-20 MG/5ML suspension 30 mL  30 mL Oral Q4H PRN Runell Gess, MD   30 mL at 07/05/13 2002  . aspirin chewable tablet 81 mg  81 mg Oral Daily Runell Gess, MD      . atorvastatin (LIPITOR) tablet 80 mg  80 mg Oral q1800 Runell Gess, MD   80 mg at 07/05/13 1737  . glipiZIDE (GLUCOTROL) tablet 10 mg  10 mg Oral BID AC Nada Boozer, NP   10 mg at 07/05/13 1555  . HYDROcodone-acetaminophen (NORCO/VICODIN) 5-325 MG per tablet 1-2 tablet  1-2 tablet Oral Q6H PRN Runell Gess, MD   1 tablet at 07/05/13 2149  . insulin aspart (novoLOG) injection 0-15 Units  0-15 Units Subcutaneous TID WC Nada Boozer, NP   5 Units at 07/05/13 1737  . insulin aspart (novoLOG) injection 0-5 Units  0-5 Units Subcutaneous QHS Nada Boozer, NP      . methocarbamol (ROBAXIN) tablet 750 mg  750 mg Oral BID PRN Nada Boozer, NP      . metoprolol tartrate (LOPRESSOR) tablet 12.5 mg  12.5 mg Oral BID Nada Boozer, NP   12.5 mg at 07/05/13 2149  . morphine  2 MG/ML injection 1 mg  1 mg Intravenous Q1H PRN Runell Gess, MD      . nitroGLYCERIN (NITROSTAT) SL tablet 0.4 mg  0.4 mg Sublingual Q5 min PRN Suzi Roots, MD   0.4 mg at 07/05/13 0533  . ondansetron (ZOFRAN) injection 4 mg  4 mg Intravenous Q6H PRN Runell Gess, MD   4 mg at 07/05/13 1530  . Ticagrelor (BRILINTA) tablet 90 mg  90 mg Oral BID Runell Gess, MD   90 mg at 07/05/13 2149    PE: General appearance: alert, cooperative and no distress Lungs: clear to auscultation bilaterally Heart: regular rate and rhythm, S1, S2 normal, no murmur, click, rub or gallop Extremities: No LEE Pulses: 2+ and symmetric Skin: No hematoma, ecchymosis or tenderness in the right groin Neurologic: Grossly normal  Lab Results:   Recent Labs  07/05/13 0529 07/06/13 0400  WBC 5.9 8.8  HGB 13.4 12.9*  HCT 40.4 37.4*  PLT 184 169   BMET  Recent Labs  07/05/13 0529 07/06/13 0400  NA 141 137  K 3.4* 3.5  CL 106 104  CO2 22 20  GLUCOSE 312* 172*  BUN 20 19  CREATININE 1.90* 1.58*  CALCIUM 9.9 8.9   PT/INR  Recent Labs  07/05/13  0529  LABPROT 13.0  INR 1.00   Cholesterol  Recent Labs  07/06/13 0400  CHOL 137   ECG - evolving Anterior STEMI changes Tele - NSR  Assessment/Plan  Principal Problem:   STEMI (ST elevation myocardial infarction)of ANT wall-total occ. of LAD  Active Problems:   CAD in native artery. LAD s/p PTCA Xience stents to prox LAD and mid LAD 07/05/13   Gout   DM (diabetes mellitus), type 2, uncontrolled, recently began insulin   HTN (hypertension)   History of agent Orange exposure   LV dysfunction, s/p MI, 07/05/13 EF 35-40% at cath Back OA - DJD pain  Plan:  SP STEMI with successful PCI to the prox LAD.  Net fluids: -2.2L after dose of IV lasix yesterday.  SOB improved.  2D echo pending.  BP soft. SCr improved.   Cardiac rehab.  Can transfer to tele.  DC in a couple days.  CBG improved.  ASA, lipitor, lopressor, brilinta (CM consulted &  paperwork provided).    PA time with pt 20 min  LOS: 1 day    HAGER, BRYAN 07/06/2013 7:41 AM  I have seen and evaluated the patient this AM along with Wilburt Finlay, PA. I agree with his findings, examination as well as impression recommendations.   Besides boderline BP, he seems to be doing ok s/p PCI for Anterior STEMI.  As expected, breathing improved following lasix --> LV Gram suggested moderately reduced LVEF (~35-40% with expected Anterior apical Akinesis)  May need standing dose - will check proBNP tomorrow.   BP will not allow further BB Titration, but as yet, no room for ACE-I/ARB (especially until we see where renal function will settle  Renal function actually seems to have improved  On high dose statin.  --> in pt Lipids seem stable.  CBG elevated yesterday, better today.  Was on Insulin as OP -- titrate SSI here (will help determine daily insulin requirement for d/c)  I agree that he can transfer to Tele, but do not think that he is appropriate for Fast-Track D/c with his Anterior STEMI, CKD, and borderline BP.  [facility] consult  MD Time with pt / chart:  HARDING,DAVID W, M.D., M.S. [facility] [address] [place], [place]  [address]  [phone] Pager # [phone] 07/06/2013 8:18 AM

## 2013-07-07 DIAGNOSIS — I2589 Other forms of chronic ischemic heart disease: Secondary | ICD-10-CM

## 2013-07-07 DIAGNOSIS — I5021 Acute systolic (congestive) heart failure: Secondary | ICD-10-CM | POA: Diagnosis present

## 2013-07-07 DIAGNOSIS — I255 Ischemic cardiomyopathy: Secondary | ICD-10-CM | POA: Diagnosis present

## 2013-07-07 DIAGNOSIS — Z9189 Other specified personal risk factors, not elsewhere classified: Secondary | ICD-10-CM | POA: Diagnosis present

## 2013-07-07 DIAGNOSIS — I238 Other current complications following acute myocardial infarction: Secondary | ICD-10-CM

## 2013-07-07 DIAGNOSIS — I236 Thrombosis of atrium, auricular appendage, and ventricle as current complications following acute myocardial infarction: Secondary | ICD-10-CM | POA: Clinically undetermined

## 2013-07-07 LAB — BASIC METABOLIC PANEL
CO2: 20 mEq/L (ref 19–32)
Chloride: 104 mEq/L (ref 96–112)
Creatinine, Ser: 1.67 mg/dL — ABNORMAL HIGH (ref 0.50–1.35)
Glucose, Bld: 155 mg/dL — ABNORMAL HIGH (ref 70–99)
Sodium: 136 mEq/L (ref 135–145)

## 2013-07-07 LAB — GLUCOSE, CAPILLARY
Glucose-Capillary: 155 mg/dL — ABNORMAL HIGH (ref 70–99)
Glucose-Capillary: 155 mg/dL — ABNORMAL HIGH (ref 70–99)
Glucose-Capillary: 110 mg/dL — ABNORMAL HIGH (ref 70–99)

## 2013-07-07 LAB — CBC
HCT: 35.5 % — ABNORMAL LOW (ref 39.0–52.0)
Hemoglobin: 12.3 g/dL — ABNORMAL LOW (ref 13.0–17.0)
WBC: 7.1 10*3/uL (ref 4.0–10.5)

## 2013-07-07 LAB — HEPARIN LEVEL (UNFRACTIONATED): Heparin Unfractionated: 0.62 IU/mL (ref 0.30–0.70)

## 2013-07-07 MED ORDER — PATIENT'S GUIDE TO USING COUMADIN BOOK
Freq: Once | Status: AC
  Administered 2013-07-07: 11:00:00
  Filled 2013-07-07: qty 1

## 2013-07-07 MED ORDER — HEPARIN (PORCINE) IN NACL 100-0.45 UNIT/ML-% IJ SOLN
1500.0000 [IU]/h | INTRAMUSCULAR | Status: DC
  Administered 2013-07-07 (×2): 1500 [IU]/h via INTRAVENOUS
  Filled 2013-07-07 (×3): qty 250

## 2013-07-07 MED ORDER — POTASSIUM CHLORIDE CRYS ER 20 MEQ PO TBCR
40.0000 meq | EXTENDED_RELEASE_TABLET | Freq: Two times a day (BID) | ORAL | Status: AC
  Administered 2013-07-07 (×2): 40 meq via ORAL
  Filled 2013-07-07 (×2): qty 2

## 2013-07-07 MED ORDER — WARFARIN - PHARMACIST DOSING INPATIENT
Freq: Every day | Status: DC
  Administered 2013-07-09 – 2013-07-10 (×2)

## 2013-07-07 MED ORDER — FUROSEMIDE 10 MG/ML IJ SOLN
INTRAMUSCULAR | Status: AC
  Filled 2013-07-07: qty 4

## 2013-07-07 MED ORDER — FUROSEMIDE 20 MG PO TABS
20.0000 mg | ORAL_TABLET | Freq: Every day | ORAL | Status: DC
  Administered 2013-07-07: 20 mg via ORAL
  Filled 2013-07-07 (×2): qty 1

## 2013-07-07 MED ORDER — HEPARIN BOLUS VIA INFUSION
4000.0000 [IU] | Freq: Once | INTRAVENOUS | Status: AC
  Administered 2013-07-07: 4000 [IU] via INTRAVENOUS
  Filled 2013-07-07: qty 4000

## 2013-07-07 MED ORDER — BD GETTING STARTED TAKE HOME KIT: 3/10ML X 30G SYRINGES
1.0000 | Freq: Once | Status: AC
  Administered 2013-07-07: 1
  Filled 2013-07-07: qty 1

## 2013-07-07 MED ORDER — WARFARIN VIDEO
Freq: Once | Status: AC
  Administered 2013-07-07: 17:00:00

## 2013-07-07 MED ORDER — INSULIN GLARGINE 100 UNIT/ML ~~LOC~~ SOLN
6.0000 [IU] | SUBCUTANEOUS | Status: DC
  Administered 2013-07-07 – 2013-07-10 (×4): 6 [IU] via SUBCUTANEOUS
  Filled 2013-07-07 (×4): qty 0.06

## 2013-07-07 MED ORDER — FUROSEMIDE 10 MG/ML IJ SOLN
20.0000 mg | Freq: Once | INTRAMUSCULAR | Status: AC
  Administered 2013-07-07: 20 mg via INTRAVENOUS

## 2013-07-07 MED ORDER — POTASSIUM CHLORIDE CRYS ER 20 MEQ PO TBCR
20.0000 meq | EXTENDED_RELEASE_TABLET | Freq: Every day | ORAL | Status: DC
  Administered 2013-07-08 – 2013-07-14 (×7): 20 meq via ORAL
  Filled 2013-07-07 (×7): qty 1

## 2013-07-07 MED ORDER — WARFARIN SODIUM 10 MG PO TABS
10.0000 mg | ORAL_TABLET | Freq: Once | ORAL | Status: AC
  Administered 2013-07-07: 10 mg via ORAL
  Filled 2013-07-07: qty 1

## 2013-07-07 NOTE — Progress Notes (Signed)
ANTICOAGULATION CONSULT NOTE - Initial Consult  Pharmacy Consult for Heparin and Coumadin  Indication: LV thrombus  Allergies  Allergen Reactions  . Penicillins Hives    Patient Measurements: Height: 5\' 10"  (177.8 cm) Weight: 238 lb 8.6 oz (108.2 kg) IBW/kg (Calculated) : 73 Heparin Dosing Weight: 97 kg  Vital Signs: Temp: 99.1 F (37.3 C) (09/04 0426) Temp src: Oral (09/04 0426) BP: 96/63 mmHg (09/04 0426) Pulse Rate: 85 (09/04 0426)  Labs:  Recent Labs  07/05/13 0529 07/05/13 0900 07/05/13 1305 07/05/13 1935 07/06/13 0400 07/07/13 0545  HGB 13.4  --   --   --  12.9* 12.3*  HCT 40.4  --   --   --  37.4* 35.5*  PLT 184  --   --   --  169 175  APTT 27  --   --   --   --   --   LABPROT 13.0  --   --   --   --   --   INR 1.00  --   --   --   --   --   CREATININE 1.90*  --   --   --  1.58* 1.67*  CKTOTAL  --   --  6829*  --   --   --   CKMB  --   --  239.7*  --   --   --   TROPONINI <0.30 >20.00* >20.00* >20.00* >20.00*  --     Estimated Creatinine Clearance: 52.9 ml/min (by C-G formula based on Cr of 1.67).   Medical History: Past Medical History  Diagnosis Date  . Diabetes mellitus without complication   . Hypertension   . Diverticulitis   . Diverticulitis   . Gout 07/05/2013  . DM (diabetes mellitus), type 2, uncontrolled, recently began insulin 07/05/2013  . History of agent Orange exposure 07/05/2013  . LV dysfunction, s/p MI, 07/05/13 07/05/2013  . STEMI (ST elevation myocardial infarction)of ANT wall-total occ. of LAD  07/05/2013  . CAD S/P percutaneous coronary angioplasty - PCI LAD LAD (Xience DES) to prox LAD and mid LAD 07/05/13 07/05/2013  . Presence of drug coated stent in LAD coronary artery 07/05/2013    2 Xience Xpedition DES to prox & mid LAD -- in STEMI   . CKD (chronic kidney disease) stage 3, GFR 30-59 ml/min 07/06/2013   Assessment:   67 yr old man, s/p STEMI and DES x 2 on 9/2, now to begin Heparin and Coumadin for LV thrombus.  Coumadin predcitor score  = 8.  Aspirin 81 mg daily and Brilinta 90 mg BID begun post-PCI; Aspirin to stop after 1 month.  Goal of Therapy:  INR 2-3 Heparin level 0.3-0.7 units/ml Monitor platelets by anticoagulation protocol: Yes   Plan:   Heparin 4000 units IV bolus, then heparin drip at 1500 units/hr.  Heparin level ~ 6 hrs after drip begins.  Coumadin 10 mg PO x 1 today.  Will begin at 12noon, subsequent doses at 6pm.  Daily heparin level, PT/INR and CBC.  Coumadin education prior to discharge.  Some discussion done today.  Dennie Fetters, [place] Pager: [phone] 07/07/2013,9:41 AM

## 2013-07-07 NOTE — Progress Notes (Signed)
Pt called RN to room, c/o "short breath" while lying back in recliner chair. Breathing easier after sitting up, 108/68, HR 80, Room sat 100%. Denies chest pain,Will notify MD and continue to monitor closely Georgette Dover

## 2013-07-07 NOTE — Progress Notes (Signed)
Called by RN for SOB. Pt says he felt SOB when he was laying back in his recliner. His symptoms improved when he sat up. On exam no edema or rales noted. It does not sound like dyspnea that we see with Brilinta which usually seems to be exertional. His BNP was 2000 this am. I ordered an additional Lasix 20 mg IV tonight. BMP in am.   Corine Shelter PA-C 07/07/2013 3:48 PM

## 2013-07-07 NOTE — Progress Notes (Signed)
ANTICOAGULATION CONSULT NOTE - Follow Up Consult  Pharmacy Consult for Heparin Indication: LV thrombus  Allergies  Allergen Reactions  . Penicillins Hives    Patient Measurements: Height: 5\' 10"  (177.8 cm) Weight: 238 lb 8.6 oz (108.2 kg) IBW/kg (Calculated) : 73 Heparin Dosing Weight: 97kg  Vital Signs: Temp: 99.5 F (37.5 C) (09/04 1941) Temp src: Oral (09/04 1941) BP: 116/80 mmHg (09/04 1941) Pulse Rate: 88 (09/04 1941)  Labs:  Recent Labs  07/05/13 0529 07/05/13 0900 07/05/13 1305 07/05/13 1935 07/06/13 0400 07/07/13 0545 07/07/13 2005  HGB 13.4  --   --   --  12.9* 12.3*  --   HCT 40.4  --   --   --  37.4* 35.5*  --   PLT 184  --   --   --  169 175  --   APTT 27  --   --   --   --   --   --   LABPROT 13.0  --   --   --   --   --   --   INR 1.00  --   --   --   --   --   --   HEPARINUNFRC  --   --   --   --   --   --  0.62  CREATININE 1.90*  --   --   --  1.58* 1.67*  --   CKTOTAL  --   --  6829*  --   --   --   --   CKMB  --   --  239.7*  --   --   --   --   TROPONINI <0.30 >20.00* >20.00* >20.00* >20.00*  --   --     Estimated Creatinine Clearance: 52.9 ml/min (by C-G formula based on Cr of 1.67).   Medications:  Heparin @ 1500 units/hr  Assessment: 67yom started on heparin for new LV thrombus. Initial heparin level is therapeutic. No bleeding reported.   Goal of Therapy:  Heparin level 0.3-0.7 units/ml Monitor platelets by anticoagulation protocol: Yes   Plan:  1) Continue heparin at 1500 units/hr 2) Follow up heparin level, CBC in AM  Fredrik Rigger 07/07/2013,9:09 PM

## 2013-07-07 NOTE — Progress Notes (Signed)
CARDIAC REHAB PHASE I   PRE:  Rate/Rhythm: 82 SR    BP: sitting 119/73    SaO2:   MODE:  Ambulation: 500 ft   POST:  Rate/Rhythm: 96 SR    BP: sitting 110/70     SaO2:   Tolerated well. Sts he feels good. Quick pace. Pt is joking. Son present, gave HF and low sodium diet but did not discuss. Will f/u. [phone]   Elissa Lovett [employer] CES, ACSM 07/07/2013 11:43 AM

## 2013-07-07 NOTE — Progress Notes (Addendum)
Subjective: Breathing a lot better; slept well Feels like he needs to have a BM  Objective: Vital signs in last 24 hours: Temp:  [97.4 F (36.3 C)-99.1 F (37.3 C)] 99.1 F (37.3 C) (09/04 0426) Pulse Rate:  [56-85] 85 (09/04 0426) Resp:  [18] 18 (09/04 0426) BP: (96-106)/(53-63) 96/63 mmHg (09/04 0426) SpO2:  [97 %-99 %] 98 % (09/04 0426) Last BM Date: 07/03/13  Intake/Output from previous day: 09/03 0701 - 09/04 0700 In: 1080 [P.O.:1080] Out: 450 [Urine:450] Intake/Output this shift:    Medications Current Facility-Administered Medications  Medication Dose Route Frequency Provider Last Rate Last Dose  . acetaminophen (TYLENOL) tablet 650 mg  650 mg Oral Q4H PRN Runell Gess, MD      . allopurinol (ZYLOPRIM) tablet 100 mg  100 mg Oral Daily Nada Boozer, NP   100 mg at 07/07/13 1048  . alum & mag hydroxide-simeth (MAALOX/MYLANTA) 200-200-20 MG/5ML suspension 30 mL  30 mL Oral Q4H PRN Runell Gess, MD   30 mL at 07/05/13 2002  . aspirin chewable tablet 81 mg  81 mg Oral Daily Runell Gess, MD   81 mg at 07/06/13 1007  . atorvastatin (LIPITOR) tablet 80 mg  80 mg Oral q1800 Runell Gess, MD   80 mg at 07/06/13 1709  . furosemide (LASIX) tablet 20 mg  20 mg Oral Daily Marykay Lex, MD   20 mg at 07/07/13 1111  . glipiZIDE (GLUCOTROL) tablet 10 mg  10 mg Oral BID AC Nada Boozer, NP   10 mg at 07/07/13 0749  . heparin ADULT infusion 100 units/mL (25000 units/250 mL)  1,500 Units/hr Intravenous Continuous Dennie Fetters, [facility] 15 mL/hr at 07/07/13 1610 1,500 Units/hr at 07/07/13 0937  . HYDROcodone-acetaminophen (NORCO/VICODIN) 5-325 MG per tablet 1-2 tablet  1-2 tablet Oral Q6H PRN Runell Gess, MD   1 tablet at 07/06/13 1716  . insulin aspart (novoLOG) injection 0-15 Units  0-15 Units Subcutaneous TID WC Nada Boozer, NP   3 Units at 07/07/13 [phone]  . insulin aspart (novoLOG) injection 0-5 Units  0-5 Units Subcutaneous QHS Nada Boozer, NP      .  insulin glargine (LANTUS) injection 6 Units  6 Units Subcutaneous BH-q7a Marykay Lex, MD   6 Units at 07/07/13 1111  . methocarbamol (ROBAXIN) tablet 750 mg  750 mg Oral BID PRN Nada Boozer, NP   750 mg at 07/06/13 2216  . metoprolol tartrate (LOPRESSOR) tablet 12.5 mg  12.5 mg Oral BID Nada Boozer, NP   12.5 mg at 07/07/13 1048  . morphine 2 MG/ML injection 1 mg  1 mg Intravenous Q1H PRN Runell Gess, MD      . nitroGLYCERIN (NITROSTAT) SL tablet 0.4 mg  0.4 mg Sublingual Q5 min PRN Suzi Roots, MD   0.4 mg at 07/05/13 0533  . ondansetron (ZOFRAN) injection 4 mg  4 mg Intravenous Q6H PRN Runell Gess, MD   4 mg at 07/05/13 1530  . [START ON 07/08/2013] potassium chloride SA (K-DUR,KLOR-CON) CR tablet 20 mEq  20 mEq Oral Daily Marykay Lex, MD      . potassium chloride SA (K-DUR,KLOR-CON) CR tablet 40 mEq  40 mEq Oral BID Marykay Lex, MD   40 mEq at 07/07/13 1111  . Ticagrelor (BRILINTA) tablet 90 mg  90 mg Oral BID Runell Gess, MD   90 mg at 07/07/13 1048  . warfarin (COUMADIN) video   Does not apply Once  Dennie Fetters, [facility]      . Warfarin - Pharmacist Dosing Inpatient   Does not apply q1800 Dennie Fetters, [facility]       PE: General appearance: alert, cooperative and no distress Lungs: clear to auscultation bilaterally Heart: regular rate and rhythm, S1, S2 normal, no murmur, click, rub or + soft S4gallop Extremities: No LEE Pulses: 2+ and symmetric Skin: No hematoma, ecchymosis or tenderness in the right groin Neurologic: Grossly normal  Lab Results:   Recent Labs  07/05/13 0529 07/06/13 0400 07/07/13 0545  WBC 5.9 8.8 7.1  HGB 13.4 12.9* 12.3*  HCT 40.4 37.4* 35.5*  PLT 184 169 175   BMET  Recent Labs  07/05/13 0529 07/06/13 0400 07/07/13 0545  NA 141 137 136  K 3.4* 3.5 3.3*  CL 106 104 104  CO2 22 20 20   GLUCOSE 312* 172* 155*  BUN 20 19 25*  CREATININE 1.90* 1.58* 1.67*  CALCIUM 9.9 8.9 8.7   PT/INR  Recent Labs   07/05/13 0529  LABPROT 13.0  INR 1.00   Cholesterol  Recent Labs  07/06/13 0400  CHOL 137   ECG - evolving Anterior STEMI changes Tele - NSR ECHO: EF 25-30% with Severe Anterior-Anteroseptal/apical Hypokinesis-Akinesis c/w LAD Infarct.  Laminated LV thrombus noted.  Assessment/Plan  Principal Problem:   STEMI (ST elevation myocardial infarction)of ANT wall-total occ. 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  Pro BNP remains elevated - will put on standing PO Diuretic.   K + is 3.3, so will need to replete & keep on standing dose.  With large Anterior Infarct - is @ high risk for sudden cardiac arrest --> will order LifeVest for d/c  LV Thrombus -- will need to anticoagulate, IV Heparin-Warfarin  BP remains borderline --  Unable to titrate on CHF medications, on low dose BB only.  BP will not allow further BB Titration, but as yet, no room for ACE-I/ARB (especially until we see where renal function will settle  Renal function stable  On high dose statin.  --> in pt Lipids seem stable.  On DAPT for now -- with need for Warfarin, would stop ASA after 1 month.  CBG better today.  Was on Insulin as OP -- titrate SSI here; has been  getting ~6-8 units of Insulin; will go ahead & start Lantus ~ 6 Units qhs along with SSI.  Consult DM educator.  Unfortunately, with severe LV dysfunction, borderline BP & apical thrombus s/p Anterior STEMI -- will need to stay for Heparin-warfarin bridging.   Will allow time to potentially initiate additional CHF meds.  Will as CM to [facility] re: OP warfarin monitoring, OP Cardiology f/u (would like to have  him seen more frequent due to Ischemic CM), ? LifeVest etc.  [facility] consult; DM Education team consult, CM consult.  MD Time with pt / chart:  HARDING,DAVID W, M.D., M.S. [facility] [address] [place], [place]  [address]  [phone] Pager # [phone] 07/07/2013 11:16 AM

## 2013-07-07 NOTE — Progress Notes (Signed)
-   pro BNP is ~2000.  Agree with lasix. Did not note that Sx earlier today.  Marykay Lex, MD

## 2013-07-07 NOTE — Progress Notes (Addendum)
Inpatient Diabetes Program Recommendations  AACE/ADA: New Consensus Statement on Inpatient Glycemic Control (2013)  Target Ranges:  Prepandial:   less than 140 mg/dL      Peak postprandial:   less than 180 mg/dL (1-2 hours)      Critically ill patients:  140 - 180 mg/dL   Consult for adjustment of Insulin/Lantus dosing and insulin education.  Will speak with patient concerning insulin at home.  Bedside RN to begin admin education at regularly scheduled doses. Agree with current regimen.  Thank you  Piedad Climes BSN, RN,CDE Inpatient Diabetes Coordinator [phone] (team pager)   ADDENDUM: this coordinator spoke with patient concerning insulin pen use at home.  Patient only recently received all necessary supplies to begin using it at home.  Patient understands how to use pen and will view the insulin pen video on the patient ed network as a refresher.  Also discussed how to titrate dose according to instructions given to patient by [facility].  No further questions/concerns at this time.

## 2013-07-08 DIAGNOSIS — Z5181 Encounter for therapeutic drug level monitoring: Secondary | ICD-10-CM

## 2013-07-08 DIAGNOSIS — Z789 Other specified health status: Secondary | ICD-10-CM

## 2013-07-08 DIAGNOSIS — Z7901 Long term (current) use of anticoagulants: Secondary | ICD-10-CM

## 2013-07-08 LAB — GLUCOSE, CAPILLARY
Glucose-Capillary: 106 mg/dL — ABNORMAL HIGH (ref 70–99)
Glucose-Capillary: 129 mg/dL — ABNORMAL HIGH (ref 70–99)

## 2013-07-08 LAB — CBC
HCT: 37.1 % — ABNORMAL LOW (ref 39.0–52.0)
Hemoglobin: 12.7 g/dL — ABNORMAL LOW (ref 13.0–17.0)
MCV: 90 fL (ref 78.0–100.0)
Platelets: 184 10*3/uL (ref 150–400)
RBC: 4.12 MIL/uL — ABNORMAL LOW (ref 4.22–5.81)
WBC: 8.5 10*3/uL (ref 4.0–10.5)

## 2013-07-08 LAB — BASIC METABOLIC PANEL
CO2: 21 mEq/L (ref 19–32)
Chloride: 105 mEq/L (ref 96–112)
GFR calc non Af Amer: 38 mL/min — ABNORMAL LOW (ref >90)
Glucose, Bld: 102 mg/dL — ABNORMAL HIGH (ref 70–99)
Potassium: 3.7 mEq/L (ref 3.5–5.1)
Sodium: 139 mEq/L (ref 135–145)

## 2013-07-08 MED ORDER — WARFARIN SODIUM 10 MG PO TABS
10.0000 mg | ORAL_TABLET | Freq: Once | ORAL | Status: AC
  Administered 2013-07-08: 10 mg via ORAL
  Filled 2013-07-08 (×2): qty 1

## 2013-07-08 MED ORDER — DOCUSATE SODIUM 100 MG PO CAPS
100.0000 mg | ORAL_CAPSULE | Freq: Two times a day (BID) | ORAL | Status: DC
  Administered 2013-07-09 – 2013-07-14 (×6): 100 mg via ORAL
  Filled 2013-07-08 (×13): qty 1

## 2013-07-08 MED ORDER — METOPROLOL TARTRATE 12.5 MG HALF TABLET
12.5000 mg | ORAL_TABLET | Freq: Two times a day (BID) | ORAL | Status: DC
  Filled 2013-07-08 (×2): qty 1

## 2013-07-08 MED ORDER — HEPARIN (PORCINE) IN NACL 100-0.45 UNIT/ML-% IJ SOLN
1350.0000 [IU]/h | INTRAMUSCULAR | Status: DC
  Administered 2013-07-08: 1450 [IU]/h via INTRAVENOUS
  Filled 2013-07-08 (×3): qty 250

## 2013-07-08 MED ORDER — POLYETHYLENE GLYCOL 3350 17 G PO PACK
17.0000 g | PACK | Freq: Every day | ORAL | Status: DC | PRN
  Filled 2013-07-08: qty 1

## 2013-07-08 NOTE — Progress Notes (Signed)
Subjective:  No further SOB  Objective:  Vital Signs in the last 24 hours: Temp:  [97.8 F (36.6 C)-99.5 F (37.5 C)] 98.8 F (37.1 C) (09/05 0220) Pulse Rate:  [80-90] 90 (09/05 0220) Resp:  [16-18] 18 (09/05 0220) BP: (89-116)/(62-80) 89/62 mmHg (09/05 0220) SpO2:  [92 %-100 %] 99 % (09/05 0220)  Intake/Output from previous day:  Intake/Output Summary (Last 24 hours) at 07/08/13 0911 Last data filed at 07/08/13 0805  Gross per 24 hour  Intake    935 ml  Output   1600 ml  Net   -665 ml    Physical Exam: General appearance: alert, cooperative and no distress Lungs: clear to auscultation bilaterally, non-labored. Heart: regular rate and rhythm, nl S1S2; soft S4 Abd: soft, NT/ND/NABS Ext: no edema   Rate: 86  Rhythm: normal sinus rhythm and premature ventricular contractions (PVC)  Lab Results:  Recent Labs  07/07/13 0545 07/08/13 0555  WBC 7.1 8.5  HGB 12.3* 12.7*  PLT 175 184    Recent Labs  07/07/13 0545 07/08/13 0555  NA 136 139  K 3.3* 3.7  CL 104 105  CO2 20 21  GLUCOSE 155* 102*  BUN 25* 26*  CREATININE 1.67* 1.79*    Recent Labs  07/05/13 1935 07/06/13 0400  TROPONINI >20.00* >20.00*    Recent Labs  07/08/13 0555  INR 1.09    Imaging: Imaging results have been reviewed  Cardiac Studies:  Assessment/Plan:   Principal Problem:   STEMI (ST elevation myocardial infarction)of ANT wall-total occ. 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 Hold Lasix today, follow BMP, Heparin to Coumadin for LVT.  Corine Shelter PA-C Beeper [phone] 07/08/2013, 9:11 AM  I have seen and evaluated the patient this AM along with Corine Shelter, PA. I agree with his findings, examination as well as impression recommendations.  S/p Large Anterior STEMI with Severe Ischemic CM - LV thrombus.  Borderline BPs preclude even use of low dose BB @ this point.   Will arrange LifeVest for d/c -- need to [facility] with [facility] re: how to get this done. Brief episode of dyspnea / orthopnea - given lasix  Creatinine seems to be climbing - ? Partly due to CIN, but we are not sure of his baseline.  UOP still steady.  Monitor & hold lasix -- will need low dose as standing med for d/c given low EF.  Apical thrombus - on IV Heparin / Warfarin --> need to make sure that he has INR f/u; ? If with [facility] or @ [facility]. Will also need to determine where he goes for Cardiology f/u -- will schedule to see Dr. Dorma Russell in ~1-2 weeks post d/c.  Currently on DAPT (ASA, Brilinta) --> can likely stop ASA after 1st month to avoid 3 meds  As BP stabilizes  - will gradually add back BB +/- ACE-I/ARB. Glycemic control looks good.  Will see  how much SSI is needed with the addition of Lantus.  Anticipate monitoring over the weekend to allow for INR/Warfarin adjustment (once close, can d/c with Lovenox)  MD Time with pt & chart: 25 min  HARDING,DAVID W, M.D., M.S. [facility] [address] [place], [place]  [address]  [phone] Pager # [phone] 07/08/2013 9:29 AM

## 2013-07-08 NOTE — Progress Notes (Signed)
CARDIAC REHAB PHASE I  Pt has been walking independently and sts he feels good. Attempted more ed with pt. Difficult to be very concise due to pts talkative nature. Pt has been watching videos and encouraged pt to read information/HF booklet as feel this would be his best learning method. Sts he cannot do CRPII soon due to travel but gave [facility] CRPII name to call if he is interested in the future. Will f/u Monday to answer questions. He can walk independently over w/e. Pt sts he is in process of getting new scales at home. [phone]  Elissa Lovett [place] CES, ACSM 07/08/2013 11:28 AM

## 2013-07-08 NOTE — Progress Notes (Signed)
Pharmacist Heart Failure Core Measure Documentation  Assessment: Ralph Short has an EF documented as 25-30% on 07/06/13 by ECHO.  Rationale: Heart failure patients with left ventricular systolic dysfunction (LVSD) and an EF < 40% should be prescribed an angiotensin converting enzyme inhibitor (ACEI) or angiotensin receptor blocker (ARB) at discharge unless a contraindication is documented in the medical record.  This patient is not currently on an ACEI or ARB for HF.  This note is being placed in the record in order to provide documentation that a contraindication to the use of these agents is present for this encounter.  ACE Inhibitor or Angiotensin Receptor Blocker is contraindicated (specify all that apply)  []   ACEI allergy AND ARB allergy []   Angioedema []   Moderate or severe aortic stenosis []   Hyperkalemia []   Hypotension []   Renal artery stenosis [x]   Worsening renal function, preexisting renal disease or dysfunction   Gardner Candle 07/08/2013 2:50 PM

## 2013-07-08 NOTE — Progress Notes (Signed)
ANTICOAGULATION CONSULT NOTE - Follow-Up Consult  Pharmacy Consult for Heparin and Coumadin  Indication: LV thrombus  Allergies  Allergen Reactions  . Penicillins Hives    Patient Measurements: Height: 5\' 10"  (177.8 cm) Weight: 238 lb 8.6 oz (108.2 kg) IBW/kg (Calculated) : 73 Heparin Dosing Weight: 97 kg  Vital Signs: Temp: 98.8 F (37.1 C) (09/05 0220) Temp src: Oral (09/05 0220) BP: 89/62 mmHg (09/05 0220) Pulse Rate: 90 (09/05 0220)  Labs:  Recent Labs  07/05/13 0900 07/05/13 1305 07/05/13 1935  07/06/13 0400 07/07/13 0545 07/07/13 2005 07/08/13 0555  HGB  --   --   --   < > 12.9* 12.3*  --  12.7*  HCT  --   --   --   --  37.4* 35.5*  --  37.1*  PLT  --   --   --   --  169 175  --  184  LABPROT  --   --   --   --   --   --   --  13.9  INR  --   --   --   --   --   --   --  1.09  HEPARINUNFRC  --   --   --   --   --   --  0.62 0.67  CREATININE  --   --   --   --  1.58* 1.67*  --  1.79*  CKTOTAL  --  6829*  --   --   --   --   --   --   CKMB  --  239.7*  --   --   --   --   --   --   TROPONINI >20.00* >20.00* >20.00*  --  >20.00*  --   --   --   < > = values in this interval not displayed.  Estimated Creatinine Clearance: 49.3 ml/min (by C-G formula based on Cr of 1.79).   Medical History: Past Medical History  Diagnosis Date  . Diabetes mellitus without complication   . Hypertension   . Diverticulitis   . Diverticulitis   . Gout 07/05/2013  . DM (diabetes mellitus), type 2, uncontrolled, recently began insulin 07/05/2013  . History of agent Orange exposure 07/05/2013  . LV dysfunction, s/p MI, 07/05/13 07/05/2013  . STEMI (ST elevation myocardial infarction)of ANT wall-total occ. of LAD  07/05/2013  . CAD S/P percutaneous coronary angioplasty - PCI LAD LAD (Xience DES) to prox LAD and mid LAD 07/05/13 07/05/2013  . Presence of drug coated stent in LAD coronary artery 07/05/2013    2 Xience Xpedition DES to prox & mid LAD -- in STEMI   . CKD (chronic kidney disease)  stage 3, GFR 30-59 ml/min 07/06/2013   Assessment:   67 yr old man, s/p STEMI and DES x 2 on 9/2, started on  Heparin and Coumadin for LV thrombus.  Coumadin predcitor score = 8.  Aspirin 81 mg daily and Brilinta 90 mg BID begun post-PCI; Aspirin to stop after 1 month.  INR still at baseline after 1 dose of Coumadin 10 mg last night.  Heparin level trending up slightly on 1500 units/hr.  No bleeding or complications noted per chart notes.  CBC stable.  Goal of Therapy:  INR 2-3 Heparin level 0.3-0.7 units/ml Monitor platelets by anticoagulation protocol: Yes   Plan:  1. Reduce heparin drip rate to 1450 units/hr. 2. Repeat Coumadin 10 mg x 1 tonight. 3. Continue daily  INR, heparin level and CBC. 4. Will need to complete Coumadin education today.  Tad Moore, BCPS  Clinical Pharmacist Pager [phone]  07/08/2013 8:56 AM

## 2013-07-09 LAB — GLUCOSE, CAPILLARY
Glucose-Capillary: 186 mg/dL — ABNORMAL HIGH (ref 70–99)
Glucose-Capillary: 147 mg/dL — ABNORMAL HIGH (ref 70–99)

## 2013-07-09 LAB — HEPARIN LEVEL (UNFRACTIONATED)
Heparin Unfractionated: 0.73 IU/mL — ABNORMAL HIGH (ref 0.30–0.70)
Heparin Unfractionated: 0.5 IU/mL (ref 0.30–0.70)

## 2013-07-09 LAB — CBC
HCT: 36.9 % — ABNORMAL LOW (ref 39.0–52.0)
Hemoglobin: 12.7 g/dL — ABNORMAL LOW (ref 13.0–17.0)
MCH: 31.4 pg (ref 26.0–34.0)
MCHC: 34.4 g/dL (ref 30.0–36.0)

## 2013-07-09 LAB — BASIC METABOLIC PANEL
BUN: 24 mg/dL — ABNORMAL HIGH (ref 6–23)
Chloride: 104 mEq/L (ref 96–112)
Creatinine, Ser: 1.72 mg/dL — ABNORMAL HIGH (ref 0.50–1.35)
GFR calc Af Amer: 46 mL/min — ABNORMAL LOW (ref >90)

## 2013-07-09 LAB — PROTIME-INR: Prothrombin Time: 15 seconds (ref 11.6–15.2)

## 2013-07-09 MED ORDER — WARFARIN SODIUM 10 MG PO TABS
10.0000 mg | ORAL_TABLET | Freq: Once | ORAL | Status: AC
  Administered 2013-07-09: 10 mg via ORAL
  Filled 2013-07-09: qty 1

## 2013-07-09 MED ORDER — HEPARIN (PORCINE) IN NACL 100-0.45 UNIT/ML-% IJ SOLN
1400.0000 [IU]/h | INTRAMUSCULAR | Status: AC
  Administered 2013-07-11 – 2013-07-12 (×2): 1200 [IU]/h via INTRAVENOUS
  Filled 2013-07-09 (×8): qty 250

## 2013-07-09 MED ORDER — CARVEDILOL 3.125 MG PO TABS
3.1250 mg | ORAL_TABLET | Freq: Two times a day (BID) | ORAL | Status: DC
  Administered 2013-07-09 – 2013-07-14 (×10): 3.125 mg via ORAL
  Filled 2013-07-09 (×12): qty 1

## 2013-07-09 NOTE — Progress Notes (Signed)
CARDIAC REHAB PHASE I   PRE:  Rate/Rhythm: 86 sinus  BP:  Standing: 126/64     MODE:  Ambulation: 550 ft   POST:  Rate/Rhythem: 119 sinus tach  BP:  Standing: 120/60   SaO2: 100 RA  Pt ambulated 550 ft assist x1.  Pt tolerated walk well and has been walking independently.  Encouraged to continue walking.  He had no follow up questions at this point.  We will follow up on Monday. Fabio Pierce, MA, ACSM RCEP [phone]   Hazle Nordmann

## 2013-07-09 NOTE — Progress Notes (Signed)
ANTICOAGULATION CONSULT NOTE - Follow-Up Consult  Pharmacy Consult for Heparin Indication: LV thrombus  Allergies  Allergen Reactions  . Penicillins Hives    Patient Measurements: Height: 5\' 10"  (177.8 cm) Weight: 238 lb 8.6 oz (108.2 kg) IBW/kg (Calculated) : 73 Heparin Dosing Weight: 97 kg  Vital Signs: Temp: 99.8 F (37.7 C) (09/06 0415) Temp src: Oral (09/06 0415) BP: 91/62 mmHg (09/06 0415) Pulse Rate: 90 (09/06 0415)  Labs:  Recent Labs  07/07/13 0545 07/07/13 2005 07/08/13 0555 07/09/13 0512  HGB 12.3*  --  12.7* 12.7*  HCT 35.5*  --  37.1* 36.9*  PLT 175  --  184 195  LABPROT  --   --  13.9 15.0  INR  --   --  1.09 1.21  HEPARINUNFRC  --  0.62 0.67 0.71*  CREATININE 1.67*  --  1.79* 1.72*    Estimated Creatinine Clearance: 51.3 ml/min (by C-G formula based on Cr of 1.72).  Assessment:   67 yr old man, s/p STEMI and DES x 2 on 9/2, started on  Heparin and Coumadin for LV thrombus.  Coumadin predcitor score = 8.  Aspirin 81 mg daily and Brilinta 90 mg BID begun post-PCI; Aspirin to stop after 1 month.  Heparin level is slightly high on 1450 units/hr. No bleeding noted. INR now with slight trend up.   Goal of Therapy:  INR 2-3 Heparin level 0.3-0.7 units/ml Monitor platelets by anticoagulation protocol: Yes   Plan:  1. Reduce heparin drip rate to 1350 units/hr. 2. Repeat Coumadin 10 mg x 1 tonight. 3. Check an 8 hour heparin level 4. Continue daily HL, INR and CBC  Lysle Pearl, PharmD, BCPS Pager # [phone] 07/09/2013 7:17 AM

## 2013-07-09 NOTE — Progress Notes (Signed)
Subjective:  No CP/SOB.  Objective:  Temp:  [98.3 F (36.8 C)-99.8 F (37.7 C)] 99.8 F (37.7 C) (09/06 0415) Pulse Rate:  [85-93] 90 (09/06 0415) Resp:  [17-19] 19 (09/06 0415) BP: (91-121)/(62-89) 91/62 mmHg (09/06 0415) SpO2:  [96 %-100 %] 96 % (09/06 0415) Weight change:   Intake/Output from previous day: 09/05 0701 - 09/06 0700 In: 840 [P.O.:840] Out: 1500 [Urine:1500]  Intake/Output from this shift:    Physical Exam: General appearance: alert and no distress Neck: no adenopathy, no carotid bruit, no JVD, supple, symmetrical, trachea midline and thyroid not enlarged, symmetric, no tenderness/mass/nodules Lungs: clear to auscultation bilaterally Heart: regular rate and rhythm, S1, S2 normal, no murmur, click, rub or gallop Extremities: extremities normal, atraumatic, no cyanosis or edema  Lab Results: Results for orders placed during the hospital encounter of 07/05/13 (from the past 48 hour(s))  GLUCOSE, CAPILLARY     Status: Abnormal   Collection Time    07/07/13 11:20 AM      Result Value Range   Glucose-Capillary 155 (*) 70 - 99 mg/dL   Comment 1 Notify RN    GLUCOSE, CAPILLARY     Status: Abnormal   Collection Time    07/07/13  3:13 PM      Result Value Range   Glucose-Capillary 163 (*) 70 - 99 mg/dL   Comment 1 Notify RN    GLUCOSE, CAPILLARY     Status: Abnormal   Collection Time    07/07/13  7:48 PM      Result Value Range   Glucose-Capillary 110 (*) 70 - 99 mg/dL  HEPARIN LEVEL (UNFRACTIONATED)     Status: None   Collection Time    07/07/13  8:05 PM      Result Value Range   Heparin Unfractionated 0.62  0.30 - 0.70 IU/mL   Comment:            IF HEPARIN RESULTS ARE BELOW     EXPECTED VALUES, AND PATIENT     DOSAGE HAS BEEN CONFIRMED,     SUGGEST FOLLOW UP TESTING     OF ANTITHROMBIN III LEVELS.  BASIC METABOLIC PANEL     Status: Abnormal   Collection Time    07/08/13  5:55 AM      Result Value Range   Sodium 139  135 - 145 mEq/L   Potassium 3.7  3.5 - 5.1 mEq/L   Chloride 105  96 - 112 mEq/L   CO2 21  19 - 32 mEq/L   Glucose, Bld 102 (*) 70 - 99 mg/dL   BUN 26 (*) 6 - 23 mg/dL   Creatinine, Ser 1.47 (*) 0.50 - 1.35 mg/dL   Calcium 9.1  8.4 - 82.9 mg/dL   GFR calc non Af Amer 38 (*) >90 mL/min   GFR calc Af Amer 43 (*) >90 mL/min   Comment: (NOTE)     The eGFR has been calculated using the CKD EPI equation.     This calculation has not been validated in all clinical situations.     eGFR's persistently <90 mL/min signify possible Chronic Kidney     Disease.  HEPARIN LEVEL (UNFRACTIONATED)     Status: None   Collection Time    07/08/13  5:55 AM      Result Value Range   Heparin Unfractionated 0.67  0.30 - 0.70 IU/mL   Comment:            IF HEPARIN RESULTS ARE BELOW  EXPECTED VALUES, AND PATIENT     DOSAGE HAS BEEN CONFIRMED,     SUGGEST FOLLOW UP TESTING     OF ANTITHROMBIN III LEVELS.  CBC     Status: Abnormal   Collection Time    07/08/13  5:55 AM      Result Value Range   WBC 8.5  4.0 - 10.5 K/uL   Comment: WHITE COUNT CONFIRMED ON SMEAR   RBC 4.12 (*) 4.22 - 5.81 MIL/uL   Hemoglobin 12.7 (*) 13.0 - 17.0 g/dL   HCT 40.9 (*) 81.1 - 91.4 %   MCV 90.0  78.0 - 100.0 fL   MCH 30.8  26.0 - 34.0 pg   MCHC 34.2  30.0 - 36.0 g/dL   RDW 78.2  95.6 - 21.3 %   Platelets 184  150 - 400 K/uL  PROTIME-INR     Status: None   Collection Time    07/08/13  5:55 AM      Result Value Range   Prothrombin Time 13.9  11.6 - 15.2 seconds   INR 1.09  0.00 - 1.49  GLUCOSE, CAPILLARY     Status: Abnormal   Collection Time    07/08/13  6:17 AM      Result Value Range   Glucose-Capillary 106 (*) 70 - 99 mg/dL   Comment 1 Documented in Chart     Comment 2 Notify RN    GLUCOSE, CAPILLARY     Status: Abnormal   Collection Time    07/08/13 11:03 AM      Result Value Range   Glucose-Capillary 129 (*) 70 - 99 mg/dL  GLUCOSE, CAPILLARY     Status: Abnormal   Collection Time    07/08/13  4:17 PM      Result Value  Range   Glucose-Capillary 136 (*) 70 - 99 mg/dL  GLUCOSE, CAPILLARY     Status: Abnormal   Collection Time    07/08/13  8:57 PM      Result Value Range   Glucose-Capillary 123 (*) 70 - 99 mg/dL  BASIC METABOLIC PANEL     Status: Abnormal   Collection Time    07/09/13  5:12 AM      Result Value Range   Sodium 138  135 - 145 mEq/L   Potassium 4.2  3.5 - 5.1 mEq/L   Chloride 104  96 - 112 mEq/L   CO2 21  19 - 32 mEq/L   Glucose, Bld 112 (*) 70 - 99 mg/dL   BUN 24 (*) 6 - 23 mg/dL   Creatinine, Ser 0.86 (*) 0.50 - 1.35 mg/dL   Calcium 9.2  8.4 - 57.8 mg/dL   GFR calc non Af Amer 39 (*) >90 mL/min   GFR calc Af Amer 46 (*) >90 mL/min   Comment: (NOTE)     The eGFR has been calculated using the CKD EPI equation.     This calculation has not been validated in all clinical situations.     eGFR's persistently <90 mL/min signify possible Chronic Kidney     Disease.  HEPARIN LEVEL (UNFRACTIONATED)     Status: Abnormal   Collection Time    07/09/13  5:12 AM      Result Value Range   Heparin Unfractionated 0.71 (*) 0.30 - 0.70 IU/mL   Comment:            IF HEPARIN RESULTS ARE BELOW     EXPECTED VALUES, AND PATIENT     DOSAGE HAS BEEN  CONFIRMED,     SUGGEST FOLLOW UP TESTING     OF ANTITHROMBIN III LEVELS.  CBC     Status: Abnormal   Collection Time    07/09/13  5:12 AM      Result Value Range   WBC 7.6  4.0 - 10.5 K/uL   RBC 4.05 (*) 4.22 - 5.81 MIL/uL   Hemoglobin 12.7 (*) 13.0 - 17.0 g/dL   HCT 16.1 (*) 09.6 - 04.5 %   MCV 91.1  78.0 - 100.0 fL   MCH 31.4  26.0 - 34.0 pg   MCHC 34.4  30.0 - 36.0 g/dL   RDW 40.9  81.1 - 91.4 %   Platelets 195  150 - 400 K/uL  PROTIME-INR     Status: None   Collection Time    07/09/13  5:12 AM      Result Value Range   Prothrombin Time 15.0  11.6 - 15.2 seconds   INR 1.21  0.00 - 1.49  GLUCOSE, CAPILLARY     Status: Abnormal   Collection Time    07/09/13  6:08 AM      Result Value Range   Glucose-Capillary 107 (*) 70 - 99 mg/dL    Comment 1 Documented in Chart     Comment 2 Notify RN      Imaging: Imaging results have been reviewed  Assessment/Plan:   1. Principal Problem: 2.   STEMI (ST elevation myocardial infarction)of ANT wall-total occ. of LAD  3. Active Problems: 4.   CAD S/P- PCI  (Xience DES) to prox LAD and mid LAD 07/05/13 5.   Gout 6.   DM (diabetes mellitus), type 2, uncontrolled, recently began insulin 7.   HTN (hypertension) 8.   History of agent Orange exposure 9.   CKD (chronic kidney disease) stage 3, GFR 30-59 ml/min 10.   Cardiomyopathy, ischemic - s/p Anterior STEMI, EF 25-30% 11.   Acute systolic HF (heart failure) - s/p Anterior STEMI 12.   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   At risk for sudden cardiac death 31.   Anticoagulated on Coumadin for LVT 15.   Time Spent Directly with Patient:  20 minutes  Length of Stay:  LOS: 4 days   POD #4 LAD DES in setting of large Ant STEMI. EF 30% by 2D with apical mural thrombus. On asa and Brilenta, iv hep and coumadin a/c managed by Pharm. INR 1.21. BNP was elevated to 2000 but good response to iv diuresis. Will start low dose Coreg and follow VS. Ultimately add ACE-I depending on BP and SCr. Will need a LifeVest but may be an issue with [facility]. Keep in hosp until INR therapeutic. Will get 2D with definity in 3 months to assess LV fxn and mural thrombus (need for continued A/C).  Runell Gess 07/09/2013, 8:11 AM

## 2013-07-09 NOTE — Progress Notes (Signed)
ANTICOAGULATION CONSULT NOTE - Follow Up Consult  Pharmacy Consult for Heparin Indication: LV thrombus  Allergies  Allergen Reactions  . Penicillins Hives    Patient Measurements: Height: 5\' 10"  (177.8 cm) Weight: 238 lb 8.6 oz (108.2 kg) IBW/kg (Calculated) : 73 Heparin Dosing Weight: 97kg  Vital Signs: Temp: 99.8 F (37.7 C) (09/06 1958) Temp src: Oral (09/06 1958) BP: 119/75 mmHg (09/06 1958) Pulse Rate: 89 (09/06 1958)  Labs:  Recent Labs  07/07/13 0545  07/08/13 0555 07/09/13 0512 07/09/13 1431 07/09/13 2300  HGB 12.3*  --  12.7* 12.7*  --   --   HCT 35.5*  --  37.1* 36.9*  --   --   PLT 175  --  184 195  --   --   LABPROT  --   --  13.9 15.0  --   --   INR  --   --  1.09 1.21  --   --   HEPARINUNFRC  --   < > 0.67 0.71* 0.73* 0.50  CREATININE 1.67*  --  1.79* 1.72*  --   --   < > = values in this interval not displayed.  Estimated Creatinine Clearance: 51.3 ml/min (by C-G formula based on Cr of 1.72).  Assessment: 67 yo male with LV thrombus for heparin.   Goal of Therapy:  Heparin level 0.3-0.7 units/ml Monitor platelets by anticoagulation protocol: Yes   Plan:  Continue Heparin at current rate  Eddie Candle 07/09/2013,11:39 PM

## 2013-07-09 NOTE — Progress Notes (Signed)
ANTICOAGULATION CONSULT NOTE - Follow Up Consult  Pharmacy Consult for Heparin Indication: LV thrombus  Allergies  Allergen Reactions  . Penicillins Hives    Patient Measurements: Height: 5\' 10"  (177.8 cm) Weight: 238 lb 8.6 oz (108.2 kg) IBW/kg (Calculated) : 73 Heparin Dosing Weight: 97kg  Vital Signs: Temp: 98.3 F (36.8 C) (09/06 1335) Temp src: Oral (09/06 1335) BP: 110/80 mmHg (09/06 1335) Pulse Rate: 83 (09/06 1335)  Labs:  Recent Labs  07/07/13 0545  07/08/13 0555 07/09/13 0512 07/09/13 1431  HGB 12.3*  --  12.7* 12.7*  --   HCT 35.5*  --  37.1* 36.9*  --   PLT 175  --  184 195  --   LABPROT  --   --  13.9 15.0  --   INR  --   --  1.09 1.21  --   HEPARINUNFRC  --   < > 0.67 0.71* 0.73*  CREATININE 1.67*  --  1.79* 1.72*  --   < > = values in this interval not displayed.  Estimated Creatinine Clearance: 51.3 ml/min (by C-G formula based on Cr of 1.72).   Medications:  Heparin @ 1350 units/hr  Assessment: 67yom continues on heparin for LV thrombus. Heparin level is actually increased despite rate decrease this morning. Will decrease rate even further. No bleeding reported.  Goal of Therapy:  Heparin level 0.3-0.7 units/ml Monitor platelets by anticoagulation protocol: Yes   Plan:  1) Decrease heparin to 1200 units/hr 2) Check 6 hour heparin level  Fredrik Rigger 07/09/2013,4:27 PM

## 2013-07-10 LAB — GLUCOSE, CAPILLARY: Glucose-Capillary: 150 mg/dL — ABNORMAL HIGH (ref 70–99)

## 2013-07-10 LAB — BASIC METABOLIC PANEL
BUN: 21 mg/dL (ref 6–23)
CO2: 24 mEq/L (ref 19–32)
Chloride: 105 mEq/L (ref 96–112)
Creatinine, Ser: 1.74 mg/dL — ABNORMAL HIGH (ref 0.50–1.35)
Glucose, Bld: 123 mg/dL — ABNORMAL HIGH (ref 70–99)

## 2013-07-10 LAB — PROTIME-INR: Prothrombin Time: 17.5 seconds — ABNORMAL HIGH (ref 11.6–15.2)

## 2013-07-10 LAB — CBC
HCT: 36.1 % — ABNORMAL LOW (ref 39.0–52.0)
Hemoglobin: 12.2 g/dL — ABNORMAL LOW (ref 13.0–17.0)
MCH: 31 pg (ref 26.0–34.0)
MCHC: 33.8 g/dL (ref 30.0–36.0)

## 2013-07-10 MED ORDER — WARFARIN SODIUM 10 MG PO TABS
10.0000 mg | ORAL_TABLET | Freq: Once | ORAL | Status: AC
  Administered 2013-07-10: 10 mg via ORAL
  Filled 2013-07-10: qty 1

## 2013-07-10 MED ORDER — INSULIN GLARGINE 100 UNIT/ML ~~LOC~~ SOLN
8.0000 [IU] | SUBCUTANEOUS | Status: DC
  Administered 2013-07-11 – 2013-07-14 (×4): 8 [IU] via SUBCUTANEOUS
  Filled 2013-07-10 (×5): qty 0.08

## 2013-07-10 NOTE — Progress Notes (Signed)
Subjective: Walking the halls without difficulty.  [facility] re: issues concerns with post-d/c care Noted small lump in R goin -- non-tender  Objective: Vital signs in last 24 hours: Temp:  [98.3 F (36.8 C)-99.8 F (37.7 C)] 98.6 F (37 C) (09/07 0436) Pulse Rate:  [81-89] 81 (09/07 0436) Resp:  [18-21] 21 (09/07 0436) BP: (110-119)/(73-80) 114/73 mmHg (09/07 0436) SpO2:  [98 %-100 %] 99 % (09/07 0436) Weight change:  Last BM Date: 07/09/13 Intake/Output from previous day: -1651 09/06 0701 - 09/07 0700 In: -  Out: 1651 [Urine:1650; Stool:1] Intake/Output this shift:   PE: Per Dr. Herbie Baltimore  Lab Results:  Recent Labs  07/09/13 0512 07/10/13 0510  WBC 7.6 7.7  HGB 12.7* 12.2*  HCT 36.9* 36.1*  PLT 195 187   BMET  Recent Labs  07/09/13 0512 07/10/13 0510  NA 138 139  K 4.2 4.2  CL 104 105  CO2 21 24  GLUCOSE 112* 123*  BUN 24* 21  CREATININE 1.72* 1.74*  CALCIUM 9.2 9.3   No results found for this basename: TROPONINI, CK, MB,  in the last 72 hours  Lab Results  Component Value Date   CHOL 137 07/06/2013   HDL 29* 07/06/2013   LDLCALC 81 07/06/2013   TRIG 134 07/06/2013   CHOLHDL 4.7 07/06/2013   Lab Results  Component Value Date   HGBA1C 9.9* 07/05/2013     Lab Results  Component Value Date   TSH 1.054 07/06/2013     Studies/Results: No results found.  Medications: I have reviewed the patient's current medications. Scheduled Meds: . allopurinol  100 mg Oral Daily  . aspirin  81 mg Oral Daily  . atorvastatin  80 mg Oral q1800  . carvedilol  3.125 mg Oral BID WC  . docusate sodium  100 mg Oral BID  . glipiZIDE  10 mg Oral BID AC  . insulin aspart  0-15 Units Subcutaneous TID WC  . insulin aspart  0-5 Units Subcutaneous QHS  . [START ON 07/11/2013] insulin glargine  8 Units Subcutaneous BH-q7a  . potassium chloride  20 mEq Oral Daily  . Ticagrelor  90 mg Oral BID  . warfarin  10 mg Oral ONCE-1800  . Warfarin -  Pharmacist Dosing Inpatient   Does not apply q1800   Continuous Infusions: . heparin 1,200 Units/hr (07/09/13 1645)   PRN Meds:.acetaminophen, alum & mag hydroxide-simeth, HYDROcodone-acetaminophen, methocarbamol, morphine injection, nitroGLYCERIN, ondansetron (ZOFRAN) IV, polyethylene glycol  Assessment/Plan: Principal Problem:   STEMI (ST elevation myocardial infarction)of ANT wall-total occ. 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 Herbie Baltimore.  INR 1.48 on Heparin.   LOS: 5 days   Time spent with pt. :5 minutes. [facility] R  Nurse Practitioner Certified Pager [phone] 07/10/2013, 11:39 AM  I have seen and evaluated the patient this AM along with Nada Boozer, NP. I agree with her findings, examination as well  as impression recommendations.  General appearance: alert, cooperative, appears stated age, no distress and mildly obese Neck: no adenopathy, no carotid bruit, no JVD, supple, symmetrical, trachea midline and thyroid not enlarged, symmetric, no tenderness/mass/nodules Lungs: clear to auscultation bilaterally, normal percussion bilaterally and non-labored Heart: regular rate and rhythm, S1, S2 normal, S4 present and no M/R Abdomen: soft, non-tender; bowel sounds normal; no masses,  no organomegaly Extremities: extremities normal, atraumatic, no cyanosis or edema and R groin site with soft fleshy / non-tender swelling - does not seem to be a hematoma-  probably early scarring. Pulses:  2+ and symmetric Neurologic: Grossly normal ; extremely garalous  I spent well over 75 min in the room with the patient going over DM control, Ischemic CM - LifeVest/ICD, Apical thombus etc.  Direct education & counseling re: post-hosptiatl f/u.  Had several family members.  S/p Large Anterior STEMI with Severe Ischemic CM - LV thrombus. Borderline BPs, but is tolerating dose BB @ this point. - will start low dose ACE-I this evening. Will arrange LifeVest for d/c -- need [facility] re: how to get this done. Will probably require direct MD-MD contact. Brief episode of dyspnea / orthopnea - given lasix; may need standing PO dose on d/c,but hold off for now. Creatinine seems stable now.-- t we are not sure of his baseline. UOP still steady.   Apical thrombus - on IV Heparin / Warfarin --> need to make sure that he has INR f/u; ? If with [facility] or @ [facility].  Will also need to determine where he goes for Cardiology f/u -- will schedule to see Dr. Dorma Russell in ~1-2 weeks post d/c.  Currently on DAPT (ASA, Brilinta) --> can likely stop ASA after 1st month to avoid 3 meds  Glycemic control looks good. Will see how much SSI is needed with the addition of Lantus. - increase to 8 Units Anticipate monitoring over the weekend to allow for INR/Warfarin adjustment (once close, can d/c with Lovenox)  MD Time with pt: 75 min  Donn Zanetti W, M.D., M.S. [facility] [address] [place], [place]  [address]  [phone] Pager # [phone] 07/10/2013 11:46 AM

## 2013-07-10 NOTE — Progress Notes (Signed)
ANTICOAGULATION CONSULT NOTE - Follow Up Consult  Pharmacy Consult for Heparin and Coumadin Indication: LV thrombus  Allergies  Allergen Reactions  . Penicillins Hives    Patient Measurements: Height: 5\' 10"  (177.8 cm) Weight: 238 lb 8.6 oz (108.2 kg) IBW/kg (Calculated) : 73 Heparin Dosing Weight: 97kg Vital Signs: Temp: 98.6 F (37 C) (09/07 0436) Temp src: Oral (09/07 0436) BP: 114/73 mmHg (09/07 0436) Pulse Rate: 81 (09/07 0436)  Labs:  Recent Labs  07/08/13 0555 07/09/13 0512 07/09/13 1431 07/09/13 2300 07/10/13 0510  HGB 12.7* 12.7*  --   --  12.2*  HCT 37.1* 36.9*  --   --  36.1*  PLT 184 195  --   --  187  LABPROT 13.9 15.0  --   --  17.5*  INR 1.09 1.21  --   --  1.48  HEPARINUNFRC 0.67 0.71* 0.73* 0.50 0.51  CREATININE 1.79* 1.72*  --   --  1.74*    Estimated Creatinine Clearance: 50.8 ml/min (by C-G formula based on Cr of 1.74).   Medications:  Heparin @ 1200 units/hr  Assessment: 67yom continues on heparin and coumadin for new LV thrombus (9/4). Heparin level is therapeutic. INR remains below goal but trending up appropriately with 10mg  doses of coumadin. CBC is stable. No bleeding reported.  Goal of Therapy:  Heparin level 0.3-0.7 units/ml INR 2-3 Monitor platelets by anticoagulation protocol: Yes   Plan:  1) Continue heparin at 1200 units/hr 2) Repeat coumadin 10mg  x 1 3) Heparin level, INR, CBC in AM  Fredrik Rigger 07/10/2013,10:45 AM

## 2013-07-11 LAB — GLUCOSE, CAPILLARY
Glucose-Capillary: 116 mg/dL — ABNORMAL HIGH (ref 70–99)
Glucose-Capillary: 127 mg/dL — ABNORMAL HIGH (ref 70–99)
Glucose-Capillary: 123 mg/dL — ABNORMAL HIGH (ref 70–99)

## 2013-07-11 LAB — BASIC METABOLIC PANEL
Calcium: 9.1 mg/dL (ref 8.4–10.5)
Creatinine, Ser: 1.66 mg/dL — ABNORMAL HIGH (ref 0.50–1.35)
GFR calc Af Amer: 48 mL/min — ABNORMAL LOW (ref >90)
GFR calc non Af Amer: 41 mL/min — ABNORMAL LOW (ref >90)

## 2013-07-11 LAB — PROTIME-INR
INR: 1.49 (ref 0.00–1.49)
Prothrombin Time: 17.6 seconds — ABNORMAL HIGH (ref 11.6–15.2)

## 2013-07-11 LAB — CBC
HCT: 35.6 % — ABNORMAL LOW (ref 39.0–52.0)
MCHC: 32.6 g/dL (ref 30.0–36.0)
RDW: 13.9 % (ref 11.5–15.5)

## 2013-07-11 LAB — HEPARIN LEVEL (UNFRACTIONATED): Heparin Unfractionated: 0.41 IU/mL (ref 0.30–0.70)

## 2013-07-11 MED ORDER — WARFARIN SODIUM 10 MG PO TABS
12.5000 mg | ORAL_TABLET | Freq: Once | ORAL | Status: AC
  Administered 2013-07-11: 12.5 mg via ORAL
  Filled 2013-07-11: qty 1

## 2013-07-11 NOTE — Progress Notes (Addendum)
Finished ed with pt. Understands all he needs to do. Now interested in [facility] and will send referral to [facility]. Will sign off. [phone] Ethelda Chick CES, ACSM 3:27 PM 07/11/2013

## 2013-07-11 NOTE — Progress Notes (Signed)
Pt up walking independently. Will f/u tomorrow. Ethelda Chick CES, ACSM 12:43 PM 07/11/2013

## 2013-07-11 NOTE — Progress Notes (Signed)
ANTICOAGULATION CONSULT NOTE - Follow Up Consult  Pharmacy Consult for Heparin and warfarin Indication: LV thrombus  Allergies  Allergen Reactions  . Penicillins Hives    Patient Measurements: Height: 5\' 10"  (177.8 cm) Weight: 238 lb 8.6 oz (108.2 kg) IBW/kg (Calculated) : 73 Heparin Dosing Weight: 97kg  Vital Signs: Temp: 98.2 F (36.8 C) (09/08 0418) Temp src: Oral (09/08 0418) BP: 120/70 mmHg (09/08 0821) Pulse Rate: 89 (09/08 0418)  Labs:  Recent Labs  07/09/13 0512  07/09/13 2300 07/10/13 0510 07/11/13 0537  HGB 12.7*  --   --  12.2* 11.6*  HCT 36.9*  --   --  36.1* 35.6*  PLT 195  --   --  187 186  LABPROT 15.0  --   --  17.5* 17.6*  INR 1.21  --   --  1.48 1.49  HEPARINUNFRC 0.71*  < > 0.50 0.51 0.41  CREATININE 1.72*  --   --  1.74* 1.66*  < > = values in this interval not displayed.  Estimated Creatinine Clearance: 53.2 ml/min (by C-G formula based on Cr of 1.66).   Medications:  Heparin @ 1200 units/hr  Assessment: 67yom continues on heparin and warfarin for new LV thrombus (9/4). Heparin level remains therapeutic at 0.41. INR was trending up appropriately, but this morning's level is unchanged from yesterday. Still subtherapeutic at 1.49. Hgb dropped about a point, platelets are stable. No bleeding noted.  Goal of Therapy:  Heparin level 0.3-0.7 units/ml INR 2-3 Monitor platelets by anticoagulation protocol: Yes   Plan:  1) Continue heparin at 1200 units/hr 2) warfarin 12.5 mg x 1 3) Heparin level, INR, CBC in AM  Ralph Short D. Ralph Short, PharmD Clinical Pharmacist Pager: [phone] 07/11/2013 9:06 AM

## 2013-07-11 NOTE — Progress Notes (Signed)
Subjective:  No SOB, apparently approved for LifeVest from [place]  Objective:  Vital Signs in the last 24 hours: Temp:  [98.1 F (36.7 C)-98.4 F (36.9 C)] 98.2 F (36.8 C) (09/08 0418) Pulse Rate:  [89-94] 89 (09/08 0418) Resp:  [18-20] 18 (09/08 0418) BP: (105-125)/(66-80) 120/70 mmHg (09/08 0821) SpO2:  [96 %-97 %] 97 % (09/08 0418)  Intake/Output from previous day:  Intake/Output Summary (Last 24 hours) at 07/11/13 1001 Last data filed at 07/11/13 0300  Gross per 24 hour  Intake      0 ml  Output    650 ml  Net   -650 ml    Physical Exam: General appearance: alert, cooperative and no distress Lungs: clear to auscultation bilaterally Heart: regular rate and rhythm   Rate: 88  Rhythm: normal sinus rhythm  Lab Results:  Recent Labs  07/10/13 0510 07/11/13 0537  WBC 7.7 6.7  HGB 12.2* 11.6*  PLT 187 186    Recent Labs  07/10/13 0510 07/11/13 0537  NA 139 137  K 4.2 4.3  CL 105 105  CO2 24 22  GLUCOSE 123* 119*  BUN 21 20  CREATININE 1.74* 1.66*   No results found for this basename: TROPONINI, CK, MB,  in the last 72 hours  Recent Labs  07/11/13 0537  INR 1.49    Imaging: Imaging results have been reviewed  Cardiac Studies:  Assessment/Plan:   Principal Problem:   STEMI (ST elevation myocardial infarction)of ANT wall-total occ. 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    Corine Shelter PA-C Beeper [phone] 07/11/2013, 10:01 AM   Agree with note written by Corine Shelter [facility]  INR sub therapeutic. On IV hep ---> coumadin A/C for apical mural thrombus. Adjust coumadin. Can prob go up on Coreg dose to 6.25 mg po BID prior to D/C. Apparently LifeVest has been approved.   Runell Gess 07/11/2013 10:26 AM

## 2013-07-12 LAB — CBC
MCH: 30.8 pg (ref 26.0–34.0)
MCHC: 33.4 g/dL (ref 30.0–36.0)
MCV: 92.1 fL (ref 78.0–100.0)
Platelets: 195 10*3/uL (ref 150–400)
RDW: 13.9 % (ref 11.5–15.5)
WBC: 6.9 10*3/uL (ref 4.0–10.5)

## 2013-07-12 LAB — BASIC METABOLIC PANEL
BUN: 19 mg/dL (ref 6–23)
Calcium: 9 mg/dL (ref 8.4–10.5)
GFR calc non Af Amer: 43 mL/min — ABNORMAL LOW (ref >90)
Glucose, Bld: 118 mg/dL — ABNORMAL HIGH (ref 70–99)
Sodium: 138 mEq/L (ref 135–145)

## 2013-07-12 LAB — GLUCOSE, CAPILLARY
Glucose-Capillary: 124 mg/dL — ABNORMAL HIGH (ref 70–99)
Glucose-Capillary: 124 mg/dL — ABNORMAL HIGH (ref 70–99)

## 2013-07-12 LAB — HEPARIN LEVEL (UNFRACTIONATED): Heparin Unfractionated: 0.38 IU/mL (ref 0.30–0.70)

## 2013-07-12 MED ORDER — ENOXAPARIN SODIUM 100 MG/ML ~~LOC~~ SOLN
100.0000 mg | Freq: Two times a day (BID) | SUBCUTANEOUS | Status: DC
  Administered 2013-07-12 – 2013-07-14 (×4): 100 mg via SUBCUTANEOUS
  Filled 2013-07-12 (×6): qty 1

## 2013-07-12 MED ORDER — ENOXAPARIN (LOVENOX) PATIENT EDUCATION KIT
PACK | Freq: Once | Status: DC
  Filled 2013-07-12: qty 1

## 2013-07-12 MED ORDER — WARFARIN SODIUM 7.5 MG PO TABS
15.0000 mg | ORAL_TABLET | Freq: Once | ORAL | Status: AC
  Administered 2013-07-12: 15 mg via ORAL
  Filled 2013-07-12: qty 2

## 2013-07-12 NOTE — Progress Notes (Signed)
Reviewed current new meds with patient and provided handouts Ralph Short

## 2013-07-12 NOTE — Progress Notes (Signed)
Subjective:  No SOB  Objective:  Vital Signs in the last 24 hours: Temp:  [98.3 F (36.8 C)-98.9 F (37.2 C)] 98.6 F (37 C) (09/09 0500) Pulse Rate:  [82-87] 82 (09/09 0500) Resp:  [18-20] 20 (09/09 0500) BP: (101-125)/(70-82) 101/70 mmHg (09/09 0500) SpO2:  [97 %] 97 % (09/09 0500)  Intake/Output from previous day:  Intake/Output Summary (Last 24 hours) at 07/12/13 0859 Last data filed at 07/12/13 0400  Gross per 24 hour  Intake    600 ml  Output   2000 ml  Net  -1400 ml    Physical Exam: General appearance: alert, cooperative and no distress Lungs: clear to auscultation bilaterally Heart: regular rate and rhythm   Rate: 82  Rhythm: normal sinus rhythm and premature ventricular contractions (PVC)  Lab Results:  Recent Labs  07/11/13 0537 07/12/13 0527  WBC 6.7 6.9  HGB 11.6* 11.3*  PLT 186 195    Recent Labs  07/11/13 0537 07/12/13 0527  NA 137 138  K 4.3 4.4  CL 105 106  CO2 22 21  GLUCOSE 119* 118*  BUN 20 19  CREATININE 1.66* 1.61*   No results found for this basename: TROPONINI, CK, MB,  in the last 72 hours  Recent Labs  07/12/13 0527  INR 1.41    Imaging: Imaging results have been reviewed  Cardiac Studies:  Assessment/Plan:   Principal Problem:   STEMI (ST elevation myocardial infarction)of ANT wall-total occ. of LAD  Active Problems:   CAD S/P- PCI  (Xience DES) to prox LAD and mid LAD 07/05/13   DM (diabetes mellitus), type 2, uncontrolled, recently began insulin   CKD (chronic kidney disease) stage 3, GFR 30-59 ml/min   Cardiomyopathy, ischemic - s/p Anterior STEMI, EF 25-30%   Acute systolic HF (heart failure) - s/p Anterior STEMI   Left ventricular apical thrombus following Anterior STEMI   At risk for sudden cardiac death   Anticoagulated on Coumadin for LVT   Gout   HTN (hypertension)   History of agent Orange exposure    PLAN:  Consider discharge on Lovenox to Coumadin for LVT. I will discuss LifeVest with Harley Hallmark PA-C Beeper [phone] 07/12/2013, 8:59 AM   Agree with note written by Corine Shelter [facility]  Cardiac stable. No evidence of CHF. Ambulating w/o diffuculty. Pt of iv hep---> coumadin A/C for Apical mural thrombus. INR slow to respond. OK to D/C on Lovenox with OP follow up of INRs in our office. Will also arrange LifeVest. TMR7.    Runell Gess 07/12/2013 9:14 AM

## 2013-07-12 NOTE — Progress Notes (Signed)
Nutrition Brief Note  Malnutrition Screening Tool result is inaccurate.  Please consult if nutrition needs are identified.  Jaelynne Hockley Kowalski RD, LDN Pager #[phone] After Hours pager #[phone]   

## 2013-07-12 NOTE — Progress Notes (Signed)
ANTICOAGULATION CONSULT NOTE  Pharmacy Consult for Heparin and warfarin Indication: LV thrombus  Allergies  Allergen Reactions  . Penicillins Hives    Patient Measurements: Height: 5\' 10"  (177.8 cm) Weight: 238 lb 8.6 oz (108.2 kg) IBW/kg (Calculated) : 73 Heparin Dosing Weight: 97kg  Vital Signs: Temp: 98.6 F (37 C) (09/09 0500) Temp src: Oral (09/09 0500) BP: 101/70 mmHg (09/09 0500) Pulse Rate: 82 (09/09 0500)  Labs:  Recent Labs  07/10/13 0510 07/11/13 0537 07/12/13 0527  HGB 12.2* 11.6* 11.3*  HCT 36.1* 35.6* 33.8*  PLT 187 186 195  LABPROT 17.5* 17.6* 16.9*  INR 1.48 1.49 1.41  HEPARINUNFRC 0.51 0.41 0.25*  CREATININE 1.74* 1.66*  --     Estimated Creatinine Clearance: 53.2 ml/min (by C-G formula based on Cr of 1.66).  Assessment: 67 yo male LV thrombus for anticoagulation.    Goal of Therapy:  Heparin level 0.3-0.7 units/ml INR 2-3 Monitor platelets by anticoagulation protocol: Yes   Plan:  Increase Heparin 1400 units/hr Check heparin level in 8 hours. Coumadin 15 mg today  Geannie Risen, PharmD, BCPS  07/12/2013 6:33 AM

## 2013-07-12 NOTE — Progress Notes (Addendum)
ANTICOAGULATION CONSULT NOTE - Follow Up Consult  Pharmacy Consult for Heparin and warfarin Indication: LV thrombus  Allergies  Allergen Reactions  . Penicillins Hives    Patient Measurements: Height: 5\' 10"  (177.8 cm) Weight: 238 lb 8.6 oz (108.2 kg) IBW/kg (Calculated) : 73 Heparin Dosing Weight: 97kg  Vital Signs: Temp: 97.6 F (36.4 C) (09/09 1345) Temp src: Oral (09/09 1345) BP: 110/68 mmHg (09/09 1345) Pulse Rate: 75 (09/09 1345)  Labs:  Recent Labs  07/10/13 0510 07/11/13 0537 07/12/13 0527 07/12/13 1400  HGB 12.2* 11.6* 11.3*  --   HCT 36.1* 35.6* 33.8*  --   PLT 187 186 195  --   LABPROT 17.5* 17.6* 16.9*  --   INR 1.48 1.49 1.41  --   HEPARINUNFRC 0.51 0.41 0.25* 0.38  CREATININE 1.74* 1.66* 1.61*  --     Estimated Creatinine Clearance: 54.9 ml/min (by C-G formula based on Cr of 1.61).  Medications:  Heparin @ 1400 units/hr  Assessment: 67yom continues on heparin and warfarin for new LV thrombus (9/4). Heparin level was SUBtherapeutic this morning and his rate was increased. A recheck is therapeutic at 0.38 now. INR still has not moved, to get 15mg  warfarin tonight. Hgb and platelets are stable, no bleeding noted.  Goal of Therapy:  Heparin level 0.3-0.7 units/ml INR 2-3 Monitor platelets by anticoagulation protocol: Yes   Plan:  1) Continue heparin at 1400 units/hr 2) Heparin level in 6 hours to confirm new rate 3) Daily INR, HL, and CBC   Qiana Landgrebe D. Yacob Wilkerson, PharmD Clinical Pharmacist Pager: [phone] 07/12/2013 2:44 PM    ADDENDUM Cardiology is transitioning patient from heparin to Lovenox in anticipation of possible discharge. Patient's weight is 108kg and SCr is 1.61 and CrCL is ~25mL/min. No bleeding is noted. Patient's INR is slow to move and he will receive 15mg  of warfarin tonight.  Plan: 1. Stop heparin at 1700 2. Start Lovenox 100mg  subq Q12h at 1800 3. CBC Q72h 4. Follow for LifeVest and discharge planning  Stephanie Littman D. Jlee Harkless,  PharmD Clinical Pharmacist Pager: [phone] 07/12/2013 3:56 PM

## 2013-07-13 LAB — BASIC METABOLIC PANEL
BUN: 18 mg/dL (ref 6–23)
Creatinine, Ser: 1.56 mg/dL — ABNORMAL HIGH (ref 0.50–1.35)
GFR calc non Af Amer: 44 mL/min — ABNORMAL LOW (ref >90)
Glucose, Bld: 89 mg/dL (ref 70–99)
Potassium: 4.3 mEq/L (ref 3.5–5.1)

## 2013-07-13 LAB — GLUCOSE, CAPILLARY
Glucose-Capillary: 93 mg/dL (ref 70–99)
Glucose-Capillary: 101 mg/dL — ABNORMAL HIGH (ref 70–99)

## 2013-07-13 MED ORDER — INFLUENZA VAC SPLIT QUAD 0.5 ML IM SUSP
0.5000 mL | INTRAMUSCULAR | Status: DC
  Filled 2013-07-13: qty 0.5

## 2013-07-13 MED ORDER — WARFARIN SODIUM 7.5 MG PO TABS
15.0000 mg | ORAL_TABLET | Freq: Once | ORAL | Status: AC
  Administered 2013-07-13: 15 mg via ORAL
  Filled 2013-07-13: qty 2

## 2013-07-13 NOTE — Progress Notes (Signed)
Pt up ambulating independently in hallway at this time; will cont. To monitor.

## 2013-07-13 NOTE — Progress Notes (Signed)
ANTICOAGULATION CONSULT NOTE - Follow Up Consult  Pharmacy Consult for warfarin and Lovenox Indication: LV Thrombus  Allergies  Allergen Reactions  . Penicillins Hives    Patient Measurements: Height: 5\' 10"  (177.8 cm) Weight: 238 lb 8.6 oz (108.2 kg) IBW/kg (Calculated) : 73   Vital Signs: Temp: 98.2 F (36.8 C) (09/10 0830) Temp src: Oral (09/10 0830) BP: 112/72 mmHg (09/10 0844) Pulse Rate: 92 (09/10 0844)  Labs:  Recent Labs  07/11/13 0537 07/12/13 0527 07/12/13 1400 07/13/13 0500  HGB 11.6* 11.3*  --   --   HCT 35.6* 33.8*  --   --   PLT 186 195  --   --   LABPROT 17.6* 16.9*  --  19.3*  INR 1.49 1.41  --  1.68*  HEPARINUNFRC 0.41 0.25* 0.38  --   CREATININE 1.66* 1.61*  --  1.56*    Estimated Creatinine Clearance: 56.6 ml/min (by C-G formula based on Cr of 1.56).  Assessment: 70 YOM with new LV thrombus. Yesterday he was transitioned from heparin gtt to Lovenox subq. INR increased today to 1.68-this is reflective of the 12.5mg  dose he received 9/8- hoping INR to be almost therapeutic by tomorrow morning as he received 15mg  last evening. His SCr continues to improve and CrCl remains <31mL/min. Weight is stable. CBC stable. No bleeding noted.   Goal of Therapy:  INR 2-3 Anti-Xa level 0.6-1.2 units/ml 4hrs after LMWH dose given Monitor platelets by anticoagulation protocol: Yes   Plan:  1. Continue Lovenox 100mg  subq Q12h 2. Warfarin 15mg  po x1 tonight 3. CBC Q72h 4. Daily PT/INR 5. Follow up for bleeding  Aaric Dolph D. Tashawna Thom, PharmD Clinical Pharmacist Pager: [phone] 07/13/2013 9:27 AM

## 2013-07-13 NOTE — Progress Notes (Signed)
Subjective:  Up without problem, no SOB  Objective:  Vital Signs in the last 24 hours: Temp:  [97.6 F (36.4 C)-98.4 F (36.9 C)] 98.1 F (36.7 C) (09/10 0532) Pulse Rate:  [75-88] 85 (09/10 0532) Resp:  [18-20] 20 (09/10 0532) BP: (110-117)/(68-75) 117/71 mmHg (09/10 0532) SpO2:  [96 %-100 %] 97 % (09/10 0532)  Intake/Output from previous day:  Intake/Output Summary (Last 24 hours) at 07/13/13 1610 Last data filed at 07/13/13 0533  Gross per 24 hour  Intake    480 ml  Output   1001 ml  Net   -521 ml    Physical Exam: General appearance: alert, cooperative and no distress Lungs: clear to auscultation bilaterally Heart: regular rate and rhythm Abd: soft/ NT/ ND/ NABS Ext: R knee in brace, no c/c/c   Rate: 85  Rhythm: normal sinus rhythm and premature ventricular contractions (PVC)  Lab Results:  Recent Labs  07/11/13 0537 07/12/13 0527  WBC 6.7 6.9  HGB 11.6* 11.3*  PLT 186 195    Recent Labs  07/12/13 0527 07/13/13 0500  NA 138 137  K 4.4 4.3  CL 106 106  CO2 21 18*  GLUCOSE 118* 89  BUN 19 18  CREATININE 1.61* 1.56*   No results found for this basename: TROPONINI, CK, MB,  in the last 72 hours  Recent Labs  07/13/13 0500  INR 1.68*    Imaging: Imaging results have been reviewed  Cardiac Studies:  Assessment/Plan:   Principal Problem:   STEMI (ST elevation myocardial infarction)of ANT wall-total occ. of LAD  Active Problems:   CAD S/P- PCI  (Xience DES) to prox LAD and mid LAD 07/05/13   DM (diabetes mellitus), type 2, uncontrolled, recently began insulin   CKD (chronic kidney disease) stage 3, GFR 30-59 ml/min   Cardiomyopathy, ischemic - s/p Anterior STEMI, EF 25-30%   Acute systolic HF (heart failure) - s/p Anterior STEMI   Left ventricular apical thrombus following Anterior STEMI   At risk for sudden cardiac death   Anticoagulated on Coumadin for LVT   Gout   HTN (hypertension)   History of agent Orange exposure  PLAN: We are  [place]est, I spoke with Dorothea Ogle yesterday. He is now on Lovenox, INR  1.68 today.  Corine Shelter PA-C Beeper [phone] 07/13/2013, 8:12 AM  I have seen and evaluated the patient this AM along with Corine Shelter, PA. I agree with his findings, examination as well as impression recommendations.  Seems to be doing quite well.  He was doing "laps" in the hall.  No angina or CHF Sx. BP finally starting to improve - I think we can increase his BB to 6.25 mg bid.  Will consider ACE-I if BP will tolerate tomorrow.  Glycemic control has been excellent ~ 2-4 Units on SSI / day.  Can probably increase Lantus to 10 Units.  On statin & DAPT.  Converted to SQ Lovenox for further bridging as INR not yet therapeutic.    Need to confirm LifeVest approval & close INR check prior to d/c.   MD Time with pt: 20 min  HARDING,DAVID W, M.D., M.S. [facility] [address] [place], [place]  [address]  [phone] Pager # [phone] 07/13/2013 1:43 PM

## 2013-07-14 LAB — BASIC METABOLIC PANEL
BUN: 19 mg/dL (ref 6–23)
CO2: 22 mEq/L (ref 19–32)
Calcium: 9.1 mg/dL (ref 8.4–10.5)
Chloride: 106 mEq/L (ref 96–112)
Creatinine, Ser: 1.56 mg/dL — ABNORMAL HIGH (ref 0.50–1.35)

## 2013-07-14 LAB — GLUCOSE, CAPILLARY
Glucose-Capillary: 124 mg/dL — ABNORMAL HIGH (ref 70–99)
Glucose-Capillary: 117 mg/dL — ABNORMAL HIGH (ref 70–99)

## 2013-07-14 MED ORDER — POTASSIUM CHLORIDE CRYS ER 20 MEQ PO TBCR: 20.0000 meq | EXTENDED_RELEASE_TABLET | Freq: Every day | ORAL | Status: AC

## 2013-07-14 MED ORDER — TICAGRELOR 90 MG PO TABS: 90.0000 mg | ORAL_TABLET | Freq: Two times a day (BID) | ORAL | Status: DC

## 2013-07-14 MED ORDER — CARVEDILOL 3.125 MG PO TABS: 3.1250 mg | ORAL_TABLET | Freq: Two times a day (BID) | ORAL | Status: DC

## 2013-07-14 MED ORDER — ASPIRIN 81 MG PO CHEW: 81.0000 mg | CHEWABLE_TABLET | Freq: Every day | ORAL | Status: DC

## 2013-07-14 MED ORDER — ATORVASTATIN CALCIUM 80 MG PO TABS: 80.0000 mg | ORAL_TABLET | Freq: Every day | ORAL | Status: AC

## 2013-07-14 MED ORDER — WARFARIN SODIUM 2.5 MG PO TABS
12.5000 mg | ORAL_TABLET | Freq: Every day | ORAL | Status: DC
  Filled 2013-07-14: qty 1

## 2013-07-14 MED ORDER — ENOXAPARIN (LOVENOX) PATIENT EDUCATION KIT: 1.0000 | PACK | Freq: Once | Status: DC

## 2013-07-14 MED ORDER — ENOXAPARIN SODIUM 100 MG/ML ~~LOC~~ SOLN: 100.0000 mg | Freq: Two times a day (BID) | SUBCUTANEOUS | Status: DC

## 2013-07-14 MED ORDER — WARFARIN SODIUM 2.5 MG PO TABS: 12.5000 mg | ORAL_TABLET | Freq: Every day | ORAL | Status: DC

## 2013-07-14 MED ORDER — NITROGLYCERIN 0.4 MG SL SUBL: 0.4000 mg | SUBLINGUAL_TABLET | SUBLINGUAL | Status: AC | PRN

## 2013-07-14 NOTE — Progress Notes (Signed)
ANTICOAGULATION CONSULT NOTE - Follow Up Consult  Pharmacy Consult for warfarin and Lovenox Indication: LV Thrombus  Allergies  Allergen Reactions  . Penicillins Hives    Patient Measurements: Height: 5\' 10"  (177.8 cm) Weight: 238 lb 8.6 oz (108.2 kg) IBW/kg (Calculated) : 73   Vital Signs: Temp: 98.3 F (36.8 C) (09/11 0628) Temp src: Oral (09/11 0628) BP: 144/84 mmHg (09/11 0628) Pulse Rate: 97 (09/11 0628)  Labs:  Recent Labs  07/12/13 0527 07/12/13 1400 07/13/13 0500 07/14/13 0524  HGB 11.3*  --   --   --   HCT 33.8*  --   --   --   PLT 195  --   --   --   LABPROT 16.9*  --  19.3* 23.8*  INR 1.41  --  1.68* 2.21*  HEPARINUNFRC 0.25* 0.38  --   --   CREATININE 1.61*  --  1.56* 1.56*    Estimated Creatinine Clearance: 56.6 ml/min (by C-G formula based on Cr of 1.56).  Assessment: 25 YOM with new LV thrombus who was transitioned to Lovenox from heparin on 9/9. Today is day 4/5 of overlap. INR increased today to 2.21. His SCr continues to improve and CrCl remains <85mL/min. Weight is stable. CBC stable. No bleeding noted. Per cards note from this morning, likely d/c today. Spoke with [name] at [facility] who will be following INR- he is scheduled for INR check tomorrow at 9:40 AM.  Goal of Therapy:  INR 2-3 Anti-Xa level 0.6-1.2 units/ml 4hrs after LMWH dose given Monitor platelets by anticoagulation protocol: Yes   Plan:  1. Continue Lovenox 100mg  subq Q12h until at least 9/12 PM 2. Warfarin 12.5mg  daily with [facility] Pharmacist to manage thereafter  3. CBC Q72h when in hospital 4. Daily PT/INR when in hospital 5. Follow up for bleeding  Ralph Short D. Ralph Short, PharmD Clinical Pharmacist Pager: [phone] 07/14/2013 9:32 AM

## 2013-07-14 NOTE — Discharge Summary (Signed)
Physician Discharge Summary  Patient ID: Ralph Short MRN: [id number] DOB/AGE: July 01, 1946 67 y.o.  Admit date: 07/05/2013 Discharge date: 07/14/2013  Admission Diagnoses: STEMI  Discharge Diagnoses:  Principal Problem:   STEMI (ST elevation myocardial infarction)of ANT wall-total occ. of LAD  Active Problems:   CAD S/P- PCI  (Xience DES) to prox LAD and mid LAD 07/05/13   Gout   DM (diabetes mellitus), type 2, uncontrolled, recently began insulin   HTN (hypertension)   History of agent Orange exposure   CKD (chronic kidney disease) stage 3, GFR 30-59 ml/min   Cardiomyopathy, ischemic - s/p Anterior STEMI, EF 25-30%   Acute systolic HF (heart failure) - s/p Anterior STEMI   Left ventricular apical thrombus following Anterior STEMI   At risk for sudden cardiac death   Anticoagulated on Coumadin for LVT   Discharged Condition: stable  Hospital Course: Ralph Short is a 67 year old moderately overweight AAM with a history of hypertension and diabetes, who  developed new onset chest pain beginning at 1 to 2 AM the morning of 07/05/13. He was brought to [facility] where he was found to have anterior ST segment elevation. He was transferred by EMS to [facility] for urgent intervention. He was taken to the cath lab. The procedure was performed by Dr. Allyson Sabal, via the right femoral artery. He was found to have an occluded proximal LAD. This was successfully treated with PCI utilizing a DES. He tolerated the procedure well and left the cath lab in stable condition. He was placed on DAPT with ASA + Brilinta. He was also started on a BB and statin. His chest pain resolved. The right femoral access site remained stable, free from hematoma and bruit. He was assessed by cardiac rehab and had no exertional angina or SOB with ambulation. A 2D echo was obtained which demonstrated severe LV dysfunction. The estimated ejection fraction was in the range of 25-30%. There was severe anterior, anteroseptal,  apical and inferoapical hypokinesis to akinesis. He was also noted to have an apical thrombus. Her was placed on anticoagulation with Warfarin. He was bridged with Lovenox with a therapeutic goal of 2.0-3.0. A LifeVest was recommended for prevention of sudden cardiac death, secondary to severe systolic dysfunction.  Approval for the LifeVest was obtained from the [facility] in [place]. Once fitted with the LifeVest and once INR reached therapeutic range, the patient was ready for discharge. He was last seen and examined by Dr. Tresa Endo, who determined that he was stable. He will follow-up at [facility] for an INR check on 07/15/13, then with Dr. Allyson Sabal on 9/26. He will need to wear his LifeVest for 3 months. He will then follow-up at [facility] for a repeat 2D echo to see if an ICD is warranted.    Consults: None  Significant Diagnostic Studies:   Emergent LHC 07/05/13 HEMODYNAMICS:  AO SYSTOLIC/AO DIASTOLIC: 96/66  LV SYSTOLIC/LV DIASTOLIC: 93/29  ANGIOGRAPHIC RESULTS:  1. Left main; normal  2. LAD; occluded in its proximal portion. This was the infarct related artery  3. Left circumflex; nondominant and free of significant disease.  4. Right coronary artery; dominant with minor irregularities. The RCA gave off grade 0-1 collaterals to the occluded LAD  5. Left ventriculography; RAO left ventriculogram was performed using  25 mL of Visipaque dye at 12 mL/second. The overall LVEF estimated  35-40 % With wall motion abnormalities notable for Anterior/ apical akinesia   2D echo 07/06/13 Study Conclusions  - Left ventricle: The cavity size  was normal. Wall thickness was normal. Systolic function was severely reduced. The estimated ejection fraction was in the range of 25% to 30% (28% by Simpson's biplane). There is severe anterior, anteroseptal, apical and inferoapical hypokinesis to akinesis suggestive of LAD territory ischemia/infarct. There appears to be alaminatedmural thrombus at  distal anteroapical/apical wall. Doppler parameters are consistent with abnormal left ventricular relaxation (grade 1 diastolic dysfunction). The E/e' ratio is >10, suggesting elevated LV filling pressure. - Aortic valve: Sclerosis without stenosis. No regurgitation. - Mitral valve: Mildly thickened leaflets . Mild regurgitation. - Left atrium: The atrium was normal in size. - Atrial septum: No defect or patent foramen ovale was identified. - Inferior vena cava: The vessel was normal in size; the respirophasic diameter changes were in the normal range (= 50%); findings are consistent with normal central venous pressure.   Treatments: See Hospital Course  Discharge Exam: Blood pressure 144/84, pulse 97, temperature 98.3 F (36.8 C), temperature source Oral, resp. rate 18, height 5\' 10"  (1.778 m), weight 238 lb 8.6 oz (108.2 kg), SpO2 100.00%.   Disposition:       Discharge Orders   Future Appointments Provider Department Dept Phone   07/15/2013 9:40 AM Phillips Hay, RPH-CPP [facility] [place] [phone]   07/29/2013 4:30 PM Runell Gess, MD [facility] [place] [phone]   Future Orders Complete By Expires   Amb Referral to Cardiac Rehabilitation  As directed    Comments:     Referring to [facility] also   Diet - low sodium heart healthy  As directed    Driving Restrictions  As directed    Comments:     No driving until seen in follow-up with Dr. Allyson Sabal   Increase activity slowly  As directed        Medication List    STOP taking these medications       amLODipine 10 MG tablet  Commonly known as:  NORVASC     losartan 100 MG tablet  Commonly known as:  COZAAR     pravastatin 80 MG tablet  Commonly known as:  PRAVACHOL      TAKE these medications       allopurinol 100 MG tablet  Commonly known as:  ZYLOPRIM  Take 100 mg by mouth daily as needed (for gout).     aspirin 81 MG chewable  tablet  Chew 1 tablet (81 mg total) by mouth daily.     atorvastatin 80 MG tablet  Commonly known as:  LIPITOR  Take 1 tablet (80 mg total) by mouth daily at 6 PM.     carvedilol 3.125 MG tablet  Commonly known as:  COREG  Take 1 tablet (3.125 mg total) by mouth 2 (two) times daily with a meal.     enoxaparin 100 MG/ML injection  Commonly known as:  LOVENOX  Inject 1 mL (100 mg total) into the skin every 12 (twelve) hours.     enoxaparin Kit  Commonly known as:  LOVENOX  1 kit by Does not apply route once.     glipiZIDE 10 MG tablet  Commonly known as:  GLUCOTROL  Take 10 mg by mouth 2 (two) times daily before a meal.     HYDROcodone-acetaminophen 5-325 MG per tablet  Commonly known as:  NORCO/VICODIN  Take 1 tablet by mouth 2 (two) times daily as needed for pain.     insulin glargine 100 UNIT/ML injection  Commonly known as:  LANTUS  Inject  15 Units into the skin at bedtime.     methocarbamol 750 MG tablet  Commonly known as:  ROBAXIN  Take 750 mg by mouth 2 (two) times daily as needed (for muscle spasms.).     nitroGLYCERIN 0.4 MG SL tablet  Commonly known as:  NITROSTAT  Place 1 tablet (0.4 mg total) under the tongue every 5 (five) minutes as needed for chest pain.     potassium chloride SA 20 MEQ tablet  Commonly known as:  K-DUR,KLOR-CON  Take 1 tablet (20 mEq total) by mouth daily.     Ticagrelor 90 MG Tabs tablet  Commonly known as:  BRILINTA  Take 1 tablet (90 mg total) by mouth 2 (two) times daily.     Ticagrelor 90 MG Tabs tablet  Commonly known as:  BRILINTA  Take 1 tablet (90 mg total) by mouth 2 (two) times daily.     warfarin 2.5 MG tablet  Commonly known as:  COUMADIN  Take 5 tablets (12.5 mg total) by mouth daily at 6 PM.       Follow-up Information   Follow up with Runell Gess, MD On 07/29/2013. (4:30 pm )    Specialty:  Cardiology   Contact information:   [address] [place] [address] [phone]       Follow  up with [facility] On 07/15/2013. (INR check at 9:40 am)    Contact information:   [phone]    TIME SPENT ON DISCHARGE, INCLUDING PHYSICIAN TIME: >30 MINUTES  Signed: Allayne Butcher, PA-C 07/14/2013, 12:03 PM

## 2013-07-14 NOTE — Progress Notes (Signed)
[facility]  Subjective: Feels great. No chest pain or SOB. Ambulating w/o difficulty. He is [facility] with the [facility] that he has not received his LifeVest yet.   Objective: Vital signs in last 24 hours: Temp:  [98.3 F (36.8 C)-98.8 F (37.1 C)] 98.3 F (36.8 C) (09/11 0628) Pulse Rate:  [74-97] 97 (09/11 0628) Resp:  [18-20] 18 (09/11 0628) BP: (122-144)/(74-84) 144/84 mmHg (09/11 0628) SpO2:  [99 %-100 %] 100 % (09/11 0628) Last BM Date: 07/13/13  Intake/Output from previous day: 09/10 0701 - 09/11 0700 In: 720 [P.O.:720] Out: 2050 [Urine:2050] Intake/Output this shift:    Medications Current Facility-Administered Medications  Medication Dose Route Frequency Provider Last Rate Last Dose  . acetaminophen (TYLENOL) tablet 650 mg  650 mg Oral Q4H PRN Runell Gess, MD      . allopurinol (ZYLOPRIM) tablet 100 mg  100 mg Oral Daily Nada Boozer, NP   100 mg at 07/13/13 1143  . alum & mag hydroxide-simeth (MAALOX/MYLANTA) 200-200-20 MG/5ML suspension 30 mL  30 mL Oral Q4H PRN Runell Gess, MD   30 mL at 07/05/13 2002  . aspirin chewable tablet 81 mg  81 mg Oral Daily Runell Gess, MD   81 mg at 07/13/13 1144  . atorvastatin (LIPITOR) tablet 80 mg  80 mg Oral q1800 Runell Gess, MD   80 mg at 07/13/13 1700  . carvedilol (COREG) tablet 3.125 mg  3.125 mg Oral BID WC Runell Gess, MD   3.125 mg at 07/14/13 0631  . docusate sodium (COLACE) capsule 100 mg  100 mg Oral BID Nada Boozer, NP   100 mg at 07/13/13 2157  . enoxaparin (LOVENOX) injection 100 mg  100 mg Subcutaneous Q12H Lauren Bajbus, RPH   100 mg at 07/14/13 0618  . enoxaparin (LOVENOX) patient education kit   Does not apply Once Marykay Lex, MD      . glipiZIDE (GLUCOTROL) tablet 10 mg  10 mg Oral BID AC Nada Boozer, NP   10 mg at 07/14/13 0856  . HYDROcodone-acetaminophen (NORCO/VICODIN) 5-325 MG per tablet 1-2 tablet  1-2 tablet Oral Q6H PRN Runell Gess, MD   1  tablet at 07/13/13 2157  . influenza vac split quadrivalent PF (FLUARIX) injection 0.5 mL  0.5 mL Intramuscular Tomorrow-1000 Runell Gess, MD      . insulin aspart (novoLOG) injection 0-15 Units  0-15 Units Subcutaneous TID WC Nada Boozer, NP   2 Units at 07/14/13 0631  . insulin aspart (novoLOG) injection 0-5 Units  0-5 Units Subcutaneous QHS Nada Boozer, NP      . insulin glargine (LANTUS) injection 8 Units  8 Units Subcutaneous BH-q7a Marykay Lex, MD   8 Units at 07/14/13 [phone]  . methocarbamol (ROBAXIN) tablet 750 mg  750 mg Oral BID PRN Nada Boozer, NP   750 mg at 07/09/13 1635  . morphine 2 MG/ML injection 1 mg  1 mg Intravenous Q1H PRN Runell Gess, MD      . nitroGLYCERIN (NITROSTAT) SL tablet 0.4 mg  0.4 mg Sublingual Q5 min PRN Suzi Roots, MD   0.4 mg at 07/05/13 0533  . ondansetron (ZOFRAN) injection 4 mg  4 mg Intravenous Q6H PRN Runell Gess, MD   4 mg at 07/05/13 1530  . polyethylene glycol (MIRALAX / GLYCOLAX) packet 17 g  17 g Oral Daily PRN Nada Boozer, NP      . potassium chloride SA (K-DUR,KLOR-CON) CR  tablet 20 mEq  20 mEq Oral Daily Marykay Lex, MD   20 mEq at 07/13/13 1143  . Ticagrelor (BRILINTA) tablet 90 mg  90 mg Oral BID Runell Gess, MD   90 mg at 07/13/13 2157  . Warfarin - Pharmacist Dosing Inpatient   Does not apply q1800 Dennie Fetters, [facility]        PE: General appearance: alert, cooperative and no distress Lungs: clear to auscultation bilaterally Heart: regular rate and rhythm, S1, S2 normal, no murmur, click, rub or gallop Extremities: no LEE Pulses: 2+ and symmetric Skin: warm and dry Neurologic: Grossly normal  Lab Results:   Recent Labs  07/12/13 0527  WBC 6.9  HGB 11.3*  HCT 33.8*  PLT 195   BMET  Recent Labs  07/12/13 0527 07/13/13 0500 07/14/13 0524  NA 138 137 137  K 4.4 4.3 4.2  CL 106 106 106  CO2 21 18* 22  GLUCOSE 118* 89 129*  BUN 19 18 19   CREATININE 1.61* 1.56* 1.56*  CALCIUM 9.0 8.9  9.1   PT/INR  Recent Labs  07/12/13 0527 07/13/13 0500 07/14/13 0524  LABPROT 16.9* 19.3* 23.8*  INR 1.41 1.68* 2.21*    Assessment/Plan  Principal Problem:   STEMI (ST elevation myocardial infarction)of ANT wall-total occ. 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 Ambulating w/o difficulty. Discharge is pending on LifeVest. I spoke with [employer] rep this AM and was informed that we should get approval from the [place] today. Once fitted with vest will discharge home. INR is now in therapeutic range at 2.21. Will continue on Coumadin, per pharmacy.     LOS: 9 days    Brittainy M. Delmer Islam 07/14/2013 9:01 AM  Patient seen and examined. Agree with assessment and plan. No recurrent chest pain with ambulation.  Life vest to be fitted. Will need to continue to titrate meds as outpatient. DC later today.   Lennette Bihari, MD, [facility] 07/14/2013 12:21 PM

## 2013-07-14 NOTE — Progress Notes (Signed)
Discharged to home with family office visits in place teaching done  

## 2013-07-15 ENCOUNTER — Ambulatory Visit (INDEPENDENT_AMBULATORY_CARE_PROVIDER_SITE_OTHER): Payer: Non-veteran care | Admitting: Pharmacist Clinician (PhC)/ Clinical Pharmacy Specialist

## 2013-07-15 ENCOUNTER — Telehealth: Payer: Self-pay | Admitting: Pharmacist Clinician (PhC)/ Clinical Pharmacy Specialist

## 2013-07-15 VITALS — BP 128/80 | HR 80

## 2013-07-15 DIAGNOSIS — I236 Thrombosis of atrium, auricular appendage, and ventricle as current complications following acute myocardial infarction: Secondary | ICD-10-CM

## 2013-07-15 DIAGNOSIS — Z7901 Long term (current) use of anticoagulants: Secondary | ICD-10-CM

## 2013-07-15 DIAGNOSIS — I238 Other current complications following acute myocardial infarction: Secondary | ICD-10-CM

## 2013-07-15 DIAGNOSIS — I219 Acute myocardial infarction, unspecified: Secondary | ICD-10-CM

## 2013-07-15 NOTE — Telephone Encounter (Signed)
Please call-have a question about the area where he had  The injection in his stomach.

## 2013-07-15 NOTE — Telephone Encounter (Signed)
Explained that bruising with lovenox is very common, just watch the bruise, should start to heal in a few days.  Also wasn't sure about follow up appointment.  I thought he said all appointments would now be [facility] by [facility], but he is asking when he needs to see cardiologist.  I will have scheduling call for a 2 week PA/NP follow up.

## 2013-07-29 ENCOUNTER — Encounter: Payer: Self-pay | Admitting: Cardiovascular Disease

## 2013-07-29 ENCOUNTER — Ambulatory Visit (INDEPENDENT_AMBULATORY_CARE_PROVIDER_SITE_OTHER): Payer: Non-veteran care | Admitting: Cardiovascular Disease

## 2013-07-29 VITALS — BP 118/72 | HR 84 | Ht 70.0 in | Wt 241.0 lb

## 2013-07-29 DIAGNOSIS — I10 Essential (primary) hypertension: Secondary | ICD-10-CM

## 2013-07-29 DIAGNOSIS — I238 Other current complications following acute myocardial infarction: Secondary | ICD-10-CM

## 2013-07-29 DIAGNOSIS — I236 Thrombosis of atrium, auricular appendage, and ventricle as current complications following acute myocardial infarction: Secondary | ICD-10-CM

## 2013-07-29 DIAGNOSIS — I2510 Atherosclerotic heart disease of native coronary artery without angina pectoris: Secondary | ICD-10-CM

## 2013-07-29 DIAGNOSIS — Z9861 Coronary angioplasty status: Secondary | ICD-10-CM

## 2013-07-29 DIAGNOSIS — I219 Acute myocardial infarction, unspecified: Secondary | ICD-10-CM

## 2013-07-29 NOTE — Assessment & Plan Note (Signed)
Patient had a anterior STEMI at 7 AM on 07/05/13. He had an occluded proximal LAD otherwise no other significant disease. I stented his proximal LAD with a expedition drug-eluting stent. His EF was 35-40% by angiography however by echo his ejection fraction was was more like 25% with an apical mural thrombus. Because of this he was started on Coumadin anticoagulation and a life vest was placed he denies chest pain or shortness of breath.

## 2013-07-29 NOTE — Patient Instructions (Addendum)
  We will see you back in follow up in December with Dr Allyson Sabal.   Dr Allyson Sabal has ordered an echocardiogram to be done in two months (end of November/beginning of December)

## 2013-07-29 NOTE — Progress Notes (Signed)
07/29/2013 Ralph Short   Feb 12, 1946  [id number]  Primary Physician No PCP Per Patient Primary Cardiologist: Runell Gess MD Roseanne Reno   HPI:  [name] is a 67 year old moderately overweight in American male who lives in [place]. He has a history of hypertension and diabetes. He does not smoke. He is no prior history of heart disease. He has never had a heart attack or stroke. He developed new onset chest pain beginning at one to 2 AM this morning. He was brought to [facility] where he was found to have anterior ST segment elevation. He was transferred by EMS to [facility] for urgent intervention.took emergently to the cath lab revealing an occluded proximal LAD which I stented using a Xpedition drug-eluting stent.a 2-D echocardiogram revealed an EF in the 25-30% range with an apical mural thrombus. He was begun on Coumadin anticoagulation and a LIifeVest was placed as well. Since his discharge he denies chest pain or shortness of breath. He is getting his medical care at the [facility].    Current Outpatient Prescriptions  Medication Sig Dispense Refill  . allopurinol (ZYLOPRIM) 100 MG tablet Take 100 mg by mouth daily as needed (for gout).      Marland Kitchen aspirin 81 MG chewable tablet Chew 1 tablet (81 mg total) by mouth daily.      Marland Kitchen atorvastatin (LIPITOR) 80 MG tablet Take 1 tablet (80 mg total) by mouth daily at 6 PM.  30 tablet  5  . carvedilol (COREG) 3.125 MG tablet Take 1 tablet (3.125 mg total) by mouth 2 (two) times daily with a meal.  60 tablet  5  . glipiZIDE (GLUCOTROL) 10 MG tablet Take 10 mg by mouth 2 (two) times daily before a meal.      . HYDROcodone-acetaminophen (NORCO/VICODIN) 5-325 MG per tablet Take 1 tablet by mouth 2 (two) times daily as needed for pain.      Marland Kitchen insulin glargine (LANTUS) 100 UNIT/ML injection Inject 15 Units into the skin at bedtime.      . methocarbamol (ROBAXIN) 750 MG tablet Take 750 mg by mouth 2 (two) times daily as  needed (for muscle spasms.).      Marland Kitchen nitroGLYCERIN (NITROSTAT) 0.4 MG SL tablet Place 1 tablet (0.4 mg total) under the tongue every 5 (five) minutes as needed for chest pain.  25 tablet  2  . potassium chloride SA (K-DUR,KLOR-CON) 20 MEQ tablet Take 1 tablet (20 mEq total) by mouth daily.  30 tablet  5  . Ticagrelor (BRILINTA) 90 MG TABS tablet Take 1 tablet (90 mg total) by mouth 2 (two) times daily.  60 tablet  10  . warfarin (COUMADIN) 2.5 MG tablet Take 5 tablets (12.5 mg total) by mouth daily at 6 PM.  150 tablet  5   No current facility-administered medications for this visit.    Allergies  Allergen Reactions  . Penicillins Hives    History   Social History  . Marital Status: Single    Spouse Name: N/A    Number of Children: N/A  . Years of Education: N/A   Occupational History  . Not on file.   Social History Main Topics  . Smoking status: Former Smoker    Quit date: 11/03/1992  . Smokeless tobacco: Never Used  . Alcohol Use: No  . Drug Use: No  . Sexual Activity: Not on file   Other Topics Concern  . Not on file   Social History Narrative  . No  narrative on file     Review of Systems: General: negative for chills, fever, night sweats or weight changes.  Cardiovascular: negative for chest pain, dyspnea on exertion, edema, orthopnea, palpitations, paroxysmal nocturnal dyspnea or shortness of breath Dermatological: negative for rash Respiratory: negative for cough or wheezing Urologic: negative for hematuria Abdominal: negative for nausea, vomiting, diarrhea, bright red blood per rectum, melena, or hematemesis Neurologic: negative for visual changes, syncope, or dizziness All other systems reviewed and are otherwise negative except as noted above.    Blood pressure 118/72, pulse 84, height 5\' 10"  (1.778 m), weight 241 lb (109.317 kg).  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 He had an occluded proximal LAD otherwise no other significant disease. I stented his proximal LAD with a expedition drug-eluting stent. His EF was 35-40% by angiography however by echo his ejection fraction was was more like 25% with an apical mural thrombus. Because of this he was started on Coumadin anticoagulation and a life vest was placed he denies chest pain or shortness of breath.  HTN (hypertension) Under good control and her medications      Runell Gess MD [facility], [facility] 07/29/2013 5:35 PM

## 2013-07-29 NOTE — Assessment & Plan Note (Signed)
Under good control and her medications 

## 2013-08-03 ENCOUNTER — Telehealth: Payer: Self-pay | Admitting: Cardiovascular Disease

## 2013-08-03 ENCOUNTER — Ambulatory Visit (INDEPENDENT_AMBULATORY_CARE_PROVIDER_SITE_OTHER): Payer: Non-veteran care | Admitting: Pharmacist Clinician (PhC)/ Clinical Pharmacy Specialist

## 2013-08-03 VITALS — BP 122/80 | HR 80

## 2013-08-03 DIAGNOSIS — I219 Acute myocardial infarction, unspecified: Secondary | ICD-10-CM

## 2013-08-03 DIAGNOSIS — I236 Thrombosis of atrium, auricular appendage, and ventricle as current complications following acute myocardial infarction: Secondary | ICD-10-CM

## 2013-08-03 DIAGNOSIS — I238 Other current complications following acute myocardial infarction: Secondary | ICD-10-CM

## 2013-08-03 DIAGNOSIS — Z7901 Long term (current) use of anticoagulants: Secondary | ICD-10-CM

## 2013-08-03 LAB — POCT INR: INR: 7

## 2013-08-03 NOTE — Telephone Encounter (Signed)
Teeth is bleeding ... Bleeding before he brushed his teeth and he is on Warfrain.Marland Kitchen Please Call  Thanks

## 2013-08-03 NOTE — Telephone Encounter (Signed)
Returned call.  Pt stated he woke up this morning w/ a mouth full of blood and was told to call if he had any bleeding.  Pt stated he washed his mouth out with mouthwash and it is bleeding "a little bit" now.  Pt also stated he was told by the [facility] that if they are going to give him his medicine, then he will have to get it checked there (INR).  Pt stated they gave him an appt for 10.8.14.  Pt informed at last check on 9.12.14, he was supposed to have it rechecked on 9.19.14 b/c his level was elevated.  Pt stated he didn't know that and that he had an appt on 9.26.14 and it wasn't checked then.  Belenda Cruise, PharmD unavailable.  Pt added on today at 11:40am for INR check.  Pt verbalized understanding and agreed w/ plan.

## 2013-08-05 ENCOUNTER — Ambulatory Visit (INDEPENDENT_AMBULATORY_CARE_PROVIDER_SITE_OTHER): Payer: Non-veteran care | Admitting: Pharmacist Clinician (PhC)/ Clinical Pharmacy Specialist

## 2013-08-05 VITALS — BP 108/62 | HR 96

## 2013-08-05 DIAGNOSIS — Z7901 Long term (current) use of anticoagulants: Secondary | ICD-10-CM

## 2013-08-05 DIAGNOSIS — I219 Acute myocardial infarction, unspecified: Secondary | ICD-10-CM

## 2013-08-05 DIAGNOSIS — I238 Other current complications following acute myocardial infarction: Secondary | ICD-10-CM

## 2013-08-05 DIAGNOSIS — I236 Thrombosis of atrium, auricular appendage, and ventricle as current complications following acute myocardial infarction: Secondary | ICD-10-CM

## 2013-08-05 LAB — POCT INR: INR: 2.5

## 2013-09-07 ENCOUNTER — Telehealth: Payer: Self-pay | Admitting: Cardiovascular Disease

## 2013-09-07 NOTE — Telephone Encounter (Signed)
Larey Seat off his porch on 10/17 and hit his head.  Started headaches on 10/30  Did CAT scan today at [facility] in [place].  Just informed that having bleeding in head.  Wants to know what Dr Allyson Sabal wants him to do  Should he go to [facility].  INR this morn 2.5  .  On Warfarin.  [facility]. Please call.

## 2013-09-07 NOTE — Telephone Encounter (Signed)
Received phone call from patient informing me that on October the 27th he fell and hit his head against the house. At that time he did not seek any medical attention. Was seen at the [place] in [place] today due to him having headaches and some problems with his vision.  CT head was  done which showed, per patient some bleeding in his head. The [place] doctor is sending the patient to [facility] for evaluation and treatment. Patient just wants Dr. Allyson Sabal to be aware of what has happened to him. This note will be sent to Dr. Allyson Sabal.

## 2013-09-08 ENCOUNTER — Telehealth: Payer: Self-pay | Admitting: *Deleted

## 2013-09-08 NOTE — Telephone Encounter (Signed)
Dr Allyson Sabal reviewed the chart.  He does not agree with Brilinta being stopped; high risk for stent thrombosis.  I advised patient to have [facility] doctors contact Dr Allyson Sabal.  Patient verbalized understanding and he has given the [facility] doctors Dr Hazle Coca contact information.

## 2013-09-08 NOTE — Telephone Encounter (Signed)
Pt fell and is at [facility] he hit his head and they told him that he needs to come off the warfarin. Pt stated that his phone is going to die and he is in room 467A.

## 2013-09-08 NOTE — Telephone Encounter (Signed)
Pt also called on 11.5.14.  Message forwarded to K. Petra Kuba, RN to discuss w/ Dr. Allyson Sabal.

## 2013-09-09 ENCOUNTER — Telehealth: Payer: Self-pay | Admitting: Cardiovascular Disease

## 2013-09-09 NOTE — Telephone Encounter (Signed)
Please call-Pt states that he can not get up with Dr Allyson Sabal and neither can his doctors.They need to talk to Dr Mariel Craft is very important.You may hear from Dr Conrad Martinsburg today.

## 2013-09-09 NOTE — Telephone Encounter (Signed)
Returned call and pt verified x 2.  Pt stated he and MDs at [facility] have not been able to get in contact w/ Dr. Allyson Sabal.  Pt upset b/c he doesn't think Dr. Allyson Sabal cares about him.  RN asked pt if he spoke w/ Samara Deist, RN, Dr. Hazle Coca nurse yesterday.  Pt stated he did.  Informed Dr. Allyson Sabal did not agree w/ stopping Brilinta r/t high risk for stent thrombosis.  Pt stated he has a bleed in his brain and they want to stop his warfarin so it can clot up.  Pt informed Dr. Allyson Sabal is out of the office today and another provider can be notified to talk w/ MDs.  Need number to call.  Pt called nurse station to ask for contact to call MD and unsuccessful.    In meantime, page from [facility] w/ Dr. Drusilla Kanner and informed pt has brain bleed.  Stated they stopped warfarin, ASA and Brilinta.  Wanted to talk w/ MD or call provider r/t pt being discharged and f/u plan.  Wilburt Finlay, PA-C notified and talked w/ MD.    Algis Downs pt needs 1 week appt w/ Dr. Allyson Sabal or Ext[facility].    Call to pt and informed.  Pt stated he can only come in on Tues or Thursday.  Appt scheduled for 11.11.14 at 8am w/ Wilburt Finlay, PA-C.    Pt verbalized understanding and agreed w/ plan.

## 2013-09-13 ENCOUNTER — Ambulatory Visit (INDEPENDENT_AMBULATORY_CARE_PROVIDER_SITE_OTHER): Payer: Non-veteran care | Admitting: Physician Assistant

## 2013-09-13 ENCOUNTER — Encounter: Payer: Self-pay | Admitting: Physician Assistant

## 2013-09-13 VITALS — BP 130/80 | HR 80 | Ht 70.0 in | Wt 243.0 lb

## 2013-09-13 DIAGNOSIS — Z5189 Encounter for other specified aftercare: Secondary | ICD-10-CM

## 2013-09-13 DIAGNOSIS — S06309A Unspecified focal traumatic brain injury with loss of consciousness of unspecified duration, initial encounter: Secondary | ICD-10-CM | POA: Insufficient documentation

## 2013-09-13 DIAGNOSIS — S06349D Traumatic hemorrhage of right cerebrum with loss of consciousness of unspecified duration, subsequent encounter: Secondary | ICD-10-CM

## 2013-09-13 MED ORDER — CLOPIDOGREL BISULFATE 75 MG PO TABS: 75.0000 mg | ORAL_TABLET | Freq: Every day | ORAL | Status: AC

## 2013-09-13 NOTE — Progress Notes (Signed)
Date:  09/13/2013   ID:  Ralph Short, DOB Feb 27, 1946, MRN [id number]  PCP:  No PCP Per Patient  Primary Cardiologist:  Allyson Sabal    History of Present Illness: Ralph Short is a 67 y.o. male obese African American male who lives in [place]. He has a history of hypertension and diabetes. He does not smoke. He is no prior history of heart disease. He has never had a heart attack or stroke. He developed new onset chest pain beginning at 1am to 2 AM on 07/05/13. He was brought to [facility] where he was found to have anterior ST segment elevation. He was transferred by EMS to [facility] for urgent intervention.took emergently to the cath lab revealing an occluded proximal LAD which Dr. Allyson Sabal stented using a Xpedition drug-eluting stent.  A 2-D echocardiogram revealed an EF in the 25-30% range with an apical mural thrombus.  He was begun on Coumadin anticoagulation and a LIifeVest was placed as well. Since his discharge he denies chest pain or shortness of breath. He is getting his medical care at the [facility].   I spoke to Dr. Drusilla Kanner at [facility] on 09/09/13 who informed me the patient had an intracranial  Hemorrhage(CT results below) and they were stopping all antiplatelets and coumadin.  She wanted him off the meds for one week.  He was given FFP at the [facility] prior to being transferred to [facility]. This is very unfortunate given his recent STEMI, DES insertion and mural thrombus.  He is at a high risk of stent thrombosis and stroke.  He presents today for follow up evaluation.  He states they he feels "great".   He does report that prior to the ICH he was getting SOB after taking his morning meds.  When he stopped the ASA, the SOB resolved.  The patient currently denies nausea, vomiting, fever, chest pain, orthopnea, dizziness, PND, cough, congestion, abdominal pain, hematochezia, melena, lower extremity edema, claudication.  09/08/13, CT brain  Without contrast:  1. Expected  evolution of the anterior right frontal lobe hemorrhage and/or hemorrhagic contusion which is similar in size, measuring approximately 2.1 x 0.9 cm.  2. Minimal surrounding edema. No significant mass effect or midline shift.  3. Overall similar overlying trace subdural hematoma.   Wt Readings from Last 3 Encounters:  09/13/13 243 lb (110.224 kg)  07/29/13 241 lb (109.317 kg)  07/05/13 238 lb 8.6 oz (108.2 kg)     Past Medical History  Diagnosis Date  . Diabetes mellitus without complication   . Hypertension   . Diverticulitis   . Diverticulitis   . Gout 07/05/2013  . DM (diabetes mellitus), type 2, uncontrolled, recently began insulin 07/05/2013  . History of agent Orange exposure 07/05/2013  . LV dysfunction, s/p MI, 07/05/13 07/05/2013  . STEMI (ST elevation myocardial infarction)of ANT wall-total occ. of LAD  07/05/2013  . CAD S/P percutaneous coronary angioplasty - PCI LAD LAD (Xience DES) to prox LAD and mid LAD 07/05/13 07/05/2013  . Presence of drug coated stent in LAD coronary artery 07/05/2013    2 Xience Xpedition DES to prox & mid LAD -- in STEMI   . CKD (chronic kidney disease) stage 3, GFR 30-59 ml/min 07/06/2013    Current Outpatient Prescriptions  Medication Sig Dispense Refill  . allopurinol (ZYLOPRIM) 100 MG tablet Take 100 mg by mouth daily as needed (for gout).      Marland Kitchen atorvastatin (LIPITOR) 80 MG tablet Take 1 tablet (80 mg  total) by mouth daily at 6 PM.  30 tablet  5  . glipiZIDE (GLUCOTROL) 10 MG tablet Take 10 mg by mouth 2 (two) times daily before a meal.      . HYDROcodone-acetaminophen (NORCO/VICODIN) 5-325 MG per tablet Take 1 tablet by mouth 2 (two) times daily as needed for pain.      Marland Kitchen insulin glargine (LANTUS) 100 UNIT/ML injection Inject 15 Units into the skin at bedtime.      Marland Kitchen losartan (COZAAR) 100 MG tablet Take 100 mg by mouth daily.      . methocarbamol (ROBAXIN) 750 MG tablet Take 750 mg by mouth 2 (two) times daily as needed (for muscle spasms.).      Marland Kitchen  nitroGLYCERIN (NITROSTAT) 0.4 MG SL tablet Place 1 tablet (0.4 mg total) under the tongue every 5 (five) minutes as needed for chest pain.  25 tablet  2  . potassium chloride SA (K-DUR,KLOR-CON) 20 MEQ tablet Take 1 tablet (20 mEq total) by mouth daily.  30 tablet  5  . aspirin 81 MG chewable tablet Chew 1 tablet (81 mg total) by mouth daily.      . carvedilol (COREG) 3.125 MG tablet Take 1 tablet (3.125 mg total) by mouth 2 (two) times daily with a meal.  60 tablet  5  . clopidogrel (PLAVIX) 75 MG tablet Take 1 tablet (75 mg total) by mouth daily.  90 tablet  3  . warfarin (COUMADIN) 2.5 MG tablet Take 5 tablets (12.5 mg total) by mouth daily at 6 PM.  150 tablet  5   No current facility-administered medications for this visit.    Allergies:    Allergies  Allergen Reactions  . Penicillins Hives    Social History:  The patient  reports that he quit smoking about 20 years ago. He has never used smokeless tobacco. He reports that he does not drink alcohol or use illicit drugs.   Family history:   Family History  Problem Relation Age of Onset  . Alzheimer's disease Mother   . Hyperlipidemia Mother   . Hypertension Mother   . Diabetes type II Mother   . Hypertension Father   . Alzheimer's disease Father   . Diabetes type II Father   . Diabetes type II Sister   . Hypertension Sister   . Heart disease Sister   . Heart disease Brother   . Hypertension Brother   . Diabetes type II Brother   . Diabetes type II Sister   . Hypertension Sister   . Diabetes type II Sister   . Hypertension Sister   . Heart disease Sister   . Diabetes type II Sister   . Hypertension Sister   . Diabetes type II Sister   . Hypertension Sister   . Diabetes type II Sister   . Hypertension Sister   . Hypertension Brother   . Diabetes type II Brother   . Hypertension Brother   . Diabetes type II Brother   . Spina bifida Son     ROS:  Please see the history of present illness.  All other systems  reviewed and negative.   PHYSICAL EXAM: VS:  BP 130/80  Pulse 80  Ht 5\' 10"  (1.778 m)  Wt 243 lb (110.224 kg)  BMI 34.87 kg/m2 Obese, well developed, in no acute distress HEENT: Pupils are equal round react to light accommodation extraocular movements are intact.  Neck: no JVDNo cervical lymphadenopathy. Cardiac: Regular rate and rhythm without murmurs rubs or gallops. Lungs:  clear to auscultation bilaterally, no wheezing, rhonchi or rales Abd: soft, positive bowel sounds all quadrants Ext: no lower extremity edema.  2+ radial and 1+ dorsalis pedis pulses. Skin: warm and dry Neuro:  Grossly normal      ASSESSMENT AND PLAN:  Problem List Items Addressed This Visit   Intracranial hemorrhage following injury, anterior right frontal lobe 09/07/13 - Primary     He has been off antiplatelet and coumadin since 09/07/13.  After discussing with Dr. Herbie Baltimore, I will change him to Plavix and start it on Saturday.  This will give him about ten days off antiplatelets.  If he has no neurological symptoms during the next week, he will restart coumadin which is follow at the [place].  INR check on 09/27/13.  He is scheduled for a follow up CT scan on 09/20/13.  I will not restart ASA.  He is also scheduled for a follow up 2D echo on 10/03/13.  Follow up in one month with Dr. Allyson Sabal    Relevant Medications      losartan (COZAAR) 100 MG tablet

## 2013-09-13 NOTE — Patient Instructions (Addendum)
1.  Start plavix on Saturday, 09/17/13.  If you do not have any problems within the next week, restart the coumadin on 09/24/13.  Get your INR checked on 09/20/13.   2.  I recommend a follow up CT scan one week after starting plavix  3.  Do not restart aspirin.  4. I will also schedule you for an echocardiogram.  5.  Follow up with Dr. Allyson Sabal in one month.

## 2013-09-13 NOTE — Assessment & Plan Note (Signed)
He has been off antiplatelet and coumadin since 09/07/13.  After discussing with Dr. Herbie Baltimore, I will change him to Plavix and start it on Saturday.  This will give him about ten days off antiplatelets.  If he has no neurological symptoms during the next week, he will restart coumadin which is follow at the [place].  INR check on 09/27/13.  He is scheduled for a follow up CT scan on 09/20/13.  I will not restart ASA.  He is also scheduled for a follow up 2D echo on 10/03/13.  Follow up in one month with Dr. Allyson Sabal

## 2013-09-16 ENCOUNTER — Telehealth: Payer: Self-pay | Admitting: Cardiovascular Disease

## 2013-09-16 NOTE — Telephone Encounter (Signed)
Returned call and pt verified x 2.  Pt with questions about restarting Plavix, Coumadin and when his testing appts are.  RN reviewed AVS w/ pt and informed CT scan should be on or after 11.24.14.  Pt verbalized understanding and agreed w/ plan.  [facility] that [facility] w[facility].  Pt also advised NOT to restart Coumadin if he has any neurological symptoms after restarting Plavix tomorrow and to go to ER for severe symptoms.  Pt verbalized understanding and agreed w/ plan.

## 2013-09-16 NOTE — Telephone Encounter (Signed)
Would like to know if he starts his plavix on tomorrow will he have problems , because he has had a bleeding in his head and . Right now he is in a uproar .Ralph Short Please call Him back . He does not want to take the plavix if it is going to cause any issues because he has not had the test yet .Ralph Short   Thanks

## 2013-09-20 ENCOUNTER — Telehealth: Payer: Self-pay | Admitting: Cardiovascular Disease

## 2013-09-20 NOTE — Telephone Encounter (Signed)
Returned call.  Pt stated he had CT Scan today and was told to contact his doctor.  Stated they said the place on the back of his head seems to be healed, but there is a place on the front that she wasn't sure about.  Stated he was told they would be getting someone else to look at it.  Pt wanted to alert office that results should be coming or a call about him.  Stated he needs to know if he still needs to start the warfarin on Nov. 22nd as planned or not.  Pt informed Dr. Hazle Coca nurse will be alerted that results pending and to be on the lookout for them so Dr. Allyson Sabal can review them as soon as possible.  Pt verbalized understanding and agreed w/ plan.  Message forwarded to K. Petra Kuba, Charity fundraiser.

## 2013-09-20 NOTE — Telephone Encounter (Signed)
Had Cat Scan this a.m. At [facility]  He was advised by our drs to start Coumandin on 22th of Nov.  Please call him  [facility] told him they would fax over results of Cat Scan

## 2013-09-21 NOTE — Telephone Encounter (Signed)
Message has been sent to Samara Deist, RN to discuss w/ Dr. Allyson Sabal.  Aram Beecham (Medical Records) took report to Dr. Hazle Coca cart for review.  Pt will be contacted once complete.

## 2013-09-21 NOTE — Telephone Encounter (Signed)
Please call about when to start Coumadin . Faxing results of CT scan and office note and her recommendations re Coumadin.  Please call

## 2013-09-21 NOTE — Telephone Encounter (Signed)
I called patient to let him know that I received the records from [facility] and I will review them with Dr Allyson Sabal tomorrow.  Patient immediately started arguing and complaining about this whole situation.  I tried to explain that Dr Allyson Sabal will be back in the morning and I will call him back as soon as I review the information with him.  The patient continued to talk very rudely to me.  I explained that I would help him any way I could and I would call him back in the morning.

## 2013-09-22 ENCOUNTER — Telehealth: Payer: Self-pay | Admitting: Cardiovascular Disease

## 2013-09-22 NOTE — Telephone Encounter (Signed)
I called this patient yesterday at around 4:15 pm. He was very irritable that I was calling so soon after a nurse had just spoke with him regarding his test results and very frustrated that we did not communicate with each other before calling him. I apologized and let him know that I was actually calling from a different department that handles prior-authorizations for tests, and that I am not a nurse or qualified to provide clinical information. He was still incredibly agitated, rude and very uncooperative. I attempted to notify him that if his only insurance coverage is through the [place] (he pointedly refused to confirm this information) and he would like for the [place] to pay for his upcoming Echo, he will need to contact his primary care physician to have them initiate the authorization for this or it will not be covered. He expressed his frustration over this information and felt that he should not have to call his primary care physician but that we should. I apologized for his frustration and tried to explain that every insurance company has different requirements. I attempted to explain that [facility] called the [facility] (I spoke with [facility] at approximately 4 pm) to check the requirements and this was the information provided to me. The VA rep was very clear that they will not cover non-VA care unless it was an unexpected emergency or unless he and his PCP obtained authorization through the [place] to be treated elsewhere. The patient interrupted me often throughout the conversation and spoke very aggressively toward me. I allowed him to vent his frustrations and I apologized often throughout the conversation. After 9 minutes, he still insisted that he would not follow the [facility]'s requirements. I informed the patient that I was concerned that he would owe a balance if he had this test at any office outside of the [place] without obtaining pre-approval through his PCP. He continued to yell and speak rudely to me. I ultimately told the patient  that I was ending the conversation and to call the office if he had any questions or if we could assist in obtaining an authorization.

## 2013-09-22 NOTE — Telephone Encounter (Signed)
Pt.notified

## 2013-09-22 NOTE — Telephone Encounter (Signed)
Please advise 

## 2013-09-22 NOTE — Telephone Encounter (Signed)
I would continue Plavix at this point. 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 [place], [place]  [address]  [phone] 09/22/2013 5:52 PM

## 2013-09-27 ENCOUNTER — Encounter: Payer: Self-pay | Admitting: Cardiovascular Disease

## 2013-10-03 ENCOUNTER — Ambulatory Visit (HOSPITAL_COMMUNITY)
Admission: RE | Admit: 2013-10-03 | Discharge: 2013-10-03 | Disposition: A | Discharge status: Home / Self Care | Payer: Non-veteran care | Source: Ambulatory Visit | Attending: Cardiovascular Disease | Admitting: Cardiovascular Disease

## 2013-10-03 DIAGNOSIS — I2510 Atherosclerotic heart disease of native coronary artery without angina pectoris: Secondary | ICD-10-CM

## 2013-10-03 NOTE — Telephone Encounter (Signed)
Ralph Short came in today 10/03/13 for his Echocardiogram.  He refused to sign any paperwork involved with this test.  He was very rude and loud as he stated that this office did not know how to do their job and that his physician was always on the [place].  According to Ralph Short, if we had done our job, he would not have to sign any paperwork at all.  I tried to explain to him that our insurance rep. Had spoken to the [place] and was told that since an Echo is not an unexpected emergency, that he would have to call his PCP at the [place] to start the authorization process.  He was very loud as he told me that was not how the [place] worked and we did not know what we were doing.  Patient stated that we could cancel his test and that he would go to the [place] and speak with them about having his testing done there and ask them to send him to a physician at [facility] where they know what they are doing.  I informed him that that was certainly his decision.

## 2013-10-11 ENCOUNTER — Telehealth: Payer: Self-pay | Admitting: *Deleted

## 2013-10-11 NOTE — Telephone Encounter (Signed)
Pt called stating that his echo is done and [facility] can call [facility] to get the results.

## 2013-10-14 ENCOUNTER — Telehealth: Payer: Self-pay | Admitting: Cardiovascular Disease

## 2013-10-14 NOTE — Telephone Encounter (Signed)
Returned call and pt verified x 2.  Pt informed message received.  RN asked if copy of result sent to Dr. Allyson Sabal.  Pt stated he told them to send a copy to Dr. Allyson Sabal, but he doesn't know if they are going to do that.  Stated they scheduled him to see another cardiologist.  Stated he has had too many problems trying to reach and see Dr. Allyson Sabal.  Pt informed that most MDs have the nurse call back to find out what is needed and may communicate through the nurse.  Pt stated his neurologist calls him and talks to him.  RN apologized that he felt he couldn't talk to Dr. Allyson Sabal.  Pt informed Dr. Allyson Sabal will be notified.  Pt verbalized understanding and agreed w/ plan.  Message forwarded to K. Petra Kuba, RN to discuss w/ Dr. Allyson Sabal.

## 2013-10-14 NOTE — Telephone Encounter (Signed)
Returned call.  Left message to call back before 4pm.  Need to know if pt had copy sent to Dr. Allyson Sabal.

## 2013-10-14 NOTE — Telephone Encounter (Signed)
Wanted you to know he received his echo results from the [place]. He found out that Dr Allyson Sabal have not received them.

## 2013-11-24 ENCOUNTER — Ambulatory Visit: Payer: Self-pay | Admitting: Pharmacist Clinician (PhC)/ Clinical Pharmacy Specialist

## 2013-11-24 DIAGNOSIS — Z7901 Long term (current) use of anticoagulants: Secondary | ICD-10-CM

## 2013-11-24 DIAGNOSIS — I236 Thrombosis of atrium, auricular appendage, and ventricle as current complications following acute myocardial infarction: Secondary | ICD-10-CM

## 2014-10-12 ENCOUNTER — Encounter (HOSPITAL_COMMUNITY): Payer: Self-pay | Admitting: Cardiovascular Disease

## 2015-03-08 ENCOUNTER — Emergency Department (HOSPITAL_BASED_OUTPATIENT_CLINIC_OR_DEPARTMENT_OTHER)
Admission: EM | Admit: 2015-03-08 | Discharge: 2015-03-08 | Disposition: A | Discharge status: Home / Self Care | Payer: Non-veteran care | Attending: Emergency Medicine | Admitting: Emergency Medicine

## 2015-03-08 ENCOUNTER — Emergency Department (HOSPITAL_BASED_OUTPATIENT_CLINIC_OR_DEPARTMENT_OTHER): Payer: Non-veteran care

## 2015-03-08 ENCOUNTER — Encounter (HOSPITAL_BASED_OUTPATIENT_CLINIC_OR_DEPARTMENT_OTHER): Payer: Self-pay | Admitting: Emergency Medicine

## 2015-03-08 DIAGNOSIS — N183 Chronic kidney disease, stage 3 (moderate): Secondary | ICD-10-CM | POA: Insufficient documentation

## 2015-03-08 DIAGNOSIS — E119 Type 2 diabetes mellitus without complications: Secondary | ICD-10-CM | POA: Diagnosis not present

## 2015-03-08 DIAGNOSIS — Z9861 Coronary angioplasty status: Secondary | ICD-10-CM | POA: Diagnosis not present

## 2015-03-08 DIAGNOSIS — Z8739 Personal history of other diseases of the musculoskeletal system and connective tissue: Secondary | ICD-10-CM | POA: Diagnosis not present

## 2015-03-08 DIAGNOSIS — Z794 Long term (current) use of insulin: Secondary | ICD-10-CM | POA: Diagnosis not present

## 2015-03-08 DIAGNOSIS — Z880 Allergy status to penicillin: Secondary | ICD-10-CM | POA: Insufficient documentation

## 2015-03-08 DIAGNOSIS — Z7902 Long term (current) use of antithrombotics/antiplatelets: Secondary | ICD-10-CM | POA: Diagnosis not present

## 2015-03-08 DIAGNOSIS — I2510 Atherosclerotic heart disease of native coronary artery without angina pectoris: Secondary | ICD-10-CM | POA: Insufficient documentation

## 2015-03-08 DIAGNOSIS — Z87891 Personal history of nicotine dependence: Secondary | ICD-10-CM | POA: Insufficient documentation

## 2015-03-08 DIAGNOSIS — I252 Old myocardial infarction: Secondary | ICD-10-CM | POA: Diagnosis not present

## 2015-03-08 DIAGNOSIS — Z8719 Personal history of other diseases of the digestive system: Secondary | ICD-10-CM | POA: Insufficient documentation

## 2015-03-08 DIAGNOSIS — Z9889 Other specified postprocedural states: Secondary | ICD-10-CM | POA: Insufficient documentation

## 2015-03-08 DIAGNOSIS — Z7982 Long term (current) use of aspirin: Secondary | ICD-10-CM | POA: Diagnosis not present

## 2015-03-08 DIAGNOSIS — Z79899 Other long term (current) drug therapy: Secondary | ICD-10-CM | POA: Insufficient documentation

## 2015-03-08 DIAGNOSIS — I129 Hypertensive chronic kidney disease with stage 1 through stage 4 chronic kidney disease, or unspecified chronic kidney disease: Secondary | ICD-10-CM | POA: Diagnosis not present

## 2015-03-08 DIAGNOSIS — R42 Dizziness and giddiness: Secondary | ICD-10-CM | POA: Insufficient documentation

## 2015-03-08 LAB — BASIC METABOLIC PANEL
ANION GAP: 8 (ref 5–15)
BUN: 14 mg/dL (ref 6–20)
CALCIUM: 9.1 mg/dL (ref 8.9–10.3)
CO2: 28 mmol/L (ref 22–32)
CREATININE: 1.44 mg/dL — AB (ref 0.61–1.24)
Chloride: 105 mmol/L (ref 101–111)
GFR calc Af Amer: 56 mL/min — ABNORMAL LOW (ref >60)
GFR, EST NON AFRICAN AMERICAN: 48 mL/min — AB (ref >60)
GLUCOSE: 109 mg/dL — AB (ref 70–99)
Potassium: 4 mmol/L (ref 3.5–5.1)
SODIUM: 141 mmol/L (ref 135–145)

## 2015-03-08 LAB — CBC WITH DIFFERENTIAL/PLATELET
BASOS ABS: 0 10*3/uL (ref 0.0–0.1)
BASOS PCT: 0 % (ref 0–1)
EOS ABS: 0.1 10*3/uL (ref 0.0–0.7)
EOS PCT: 2 % (ref 0–5)
HEMATOCRIT: 45.8 % (ref 39.0–52.0)
Hemoglobin: 15.1 g/dL (ref 13.0–17.0)
LYMPHS PCT: 45 % (ref 12–46)
Lymphs Abs: 2.2 10*3/uL (ref 0.7–4.0)
MCH: 31.5 pg (ref 26.0–34.0)
MCHC: 33 g/dL (ref 30.0–36.0)
MCV: 95.6 fL (ref 78.0–100.0)
MONO ABS: 0.4 10*3/uL (ref 0.1–1.0)
Monocytes Relative: 8 % (ref 3–12)
Neutro Abs: 2.2 10*3/uL (ref 1.7–7.7)
Neutrophils Relative %: 45 % (ref 43–77)
PLATELETS: 177 10*3/uL (ref 150–400)
RBC: 4.79 MIL/uL (ref 4.22–5.81)
RDW: 14.2 % (ref 11.5–15.5)
WBC: 4.9 10*3/uL (ref 4.0–10.5)

## 2015-03-08 LAB — TROPONIN I: Troponin I: <0.03 ng/mL (ref <0.031)

## 2015-03-08 LAB — PROTIME-INR
INR: 1 (ref 0.00–1.49)
Prothrombin Time: 13.2 seconds (ref 11.6–15.2)

## 2015-03-08 MED ORDER — DIAZEPAM 5 MG/ML IJ SOLN
2.5000 mg | Freq: Once | INTRAMUSCULAR | Status: AC
  Administered 2015-03-08: 2.5 mg via INTRAVENOUS
  Filled 2015-03-08: qty 2

## 2015-03-08 MED ORDER — MECLIZINE HCL 25 MG PO TABS: 25.0000 mg | ORAL_TABLET | Freq: Three times a day (TID) | ORAL | Status: AC | PRN

## 2015-03-08 MED ORDER — SODIUM CHLORIDE 0.9 % IV BOLUS (SEPSIS)
500.0000 mL | Freq: Once | INTRAVENOUS | Status: AC
  Administered 2015-03-08: 500 mL via INTRAVENOUS

## 2015-03-08 MED ORDER — MECLIZINE HCL 25 MG PO TABS
12.5000 mg | ORAL_TABLET | Freq: Once | ORAL | Status: AC
  Administered 2015-03-08: 12.5 mg via ORAL
  Filled 2015-03-08: qty 1

## 2015-03-08 NOTE — ED Provider Notes (Signed)
CSN: [id number]     Arrival date & time 03/08/15  1251 History   First MD Initiated Contact with Patient 03/08/15 1307     Chief Complaint  Patient presents with  . Dizziness     (Consider location/radiation/quality/duration/timing/severity/associated sxs/prior Treatment) HPI Comments: Patient presents to the ER for evaluation of dizziness. Patient reports that symptoms have been ongoing for one week. Initially he had pressure in his head and mild intermittent episodes of dizziness and feeling like he was spinning. Over the last 2 days symptoms have worsened. Today he is having trouble walking, reports that he has to hold onto objects to walk because of the severity of his spinning sensation. He is not expressing any headache. No vomiting associated with symptoms. Denies chest pain, shortness of breath.  Patient is a 69 y.o. male presenting with dizziness.  Dizziness   Past Medical History  Diagnosis Date  . Diabetes mellitus without complication   . Hypertension   . Diverticulitis   . Diverticulitis   . Gout 07/05/2013  . DM (diabetes mellitus), type 2, uncontrolled, recently began insulin 07/05/2013  . History of agent Orange exposure 07/05/2013  . LV dysfunction, s/p MI, 07/05/13 07/05/2013  . STEMI (ST elevation myocardial infarction)of ANT wall-total occ. of LAD  07/05/2013  . CAD S/P percutaneous coronary angioplasty - PCI LAD LAD (Xience DES) to prox LAD and mid LAD 07/05/13 07/05/2013  . Presence of drug coated stent in LAD coronary artery 07/05/2013    2 Xience Xpedition DES to prox & mid LAD -- in STEMI   . CKD (chronic kidney disease) stage 3, GFR 30-59 ml/min 07/06/2013   Past Surgical History  Procedure Laterality Date  . Knee surgery    . Colon surgery    . Coronary angioplasty with stent placement  07/05/13    emergency PCI and stenting in the setting of anterior STEMI a total proximal LAD with excellent angiographic result and a door to balloon time of 25 minutes.   . Left heart  catheterization with coronary angiogram N/A 07/05/2013    Procedure: LEFT HEART CATHETERIZATION WITH CORONARY ANGIOGRAM;  Surgeon: Runell Gess, MD;  Location: [facility] CATH LAB;  Service: Cardiovascular;  Laterality: N/A;   Family History  Problem Relation Age of Onset  . Alzheimer's disease Mother   . Hyperlipidemia Mother   . Hypertension Mother   . Diabetes type II Mother   . Hypertension Father   . Alzheimer's disease Father   . Diabetes type II Father   . Diabetes type II Sister   . Hypertension Sister   . Heart disease Sister   . Heart disease Brother   . Hypertension Brother   . Diabetes type II Brother   . Diabetes type II Sister   . Hypertension Sister   . Diabetes type II Sister   . Hypertension Sister   . Heart disease Sister   . Diabetes type II Sister   . Hypertension Sister   . Diabetes type II Sister   . Hypertension Sister   . Diabetes type II Sister   . Hypertension Sister   . Hypertension Brother   . Diabetes type II Brother   . Hypertension Brother   . Diabetes type II Brother   . Spina bifida Son    History  Substance Use Topics  . Smoking status: Former Smoker    Quit date: 11/03/1992  . Smokeless tobacco: Never Used  . Alcohol Use: No    Review of Systems  Neurological:  Positive for dizziness.  All other systems reviewed and are negative.     Allergies  Penicillins  Home Medications   Prior to Admission medications   Medication Sig Start Date End Date Taking? Authorizing Provider  allopurinol (ZYLOPRIM) 100 MG tablet Take 100 mg by mouth daily as needed (for gout).    Historical Provider, MD  aspirin 81 MG chewable tablet Chew 1 tablet (81 mg total) by mouth daily. 07/14/13   Brittainy Sherlynn Carbon, PA-C  atorvastatin (LIPITOR) 80 MG tablet Take 1 tablet (80 mg total) by mouth daily at 6 PM. 07/14/13   Brittainy M Sharol Harness, PA-C  carvedilol (COREG) 3.125 MG tablet Take 1 tablet (3.125 mg total) by mouth 2 (two) times daily with a meal. 07/14/13    Brittainy Sherlynn Carbon, PA-C  clopidogrel (PLAVIX) 75 MG tablet Take 1 tablet (75 mg total) by mouth daily. 09/13/13   Dwana Melena, PA-C  glipiZIDE (GLUCOTROL) 10 MG tablet Take 10 mg by mouth 2 (two) times daily before a meal.    Historical Provider, MD  HYDROcodone-acetaminophen (NORCO/VICODIN) 5-325 MG per tablet Take 1 tablet by mouth 2 (two) times daily as needed for pain.    Historical Provider, MD  insulin glargine (LANTUS) 100 UNIT/ML injection Inject 15 Units into the skin at bedtime.    Historical Provider, MD  losartan (COZAAR) 100 MG tablet Take 100 mg by mouth daily.    Historical Provider, MD  methocarbamol (ROBAXIN) 750 MG tablet Take 750 mg by mouth 2 (two) times daily as needed (for muscle spasms.).    Historical Provider, MD  nitroGLYCERIN (NITROSTAT) 0.4 MG SL tablet Place 1 tablet (0.4 mg total) under the tongue every 5 (five) minutes as needed for chest pain. 07/14/13   Brittainy Sherlynn Carbon, PA-C  potassium chloride SA (K-DUR,KLOR-CON) 20 MEQ tablet Take 1 tablet (20 mEq total) by mouth daily. 07/14/13   Brittainy Sherlynn Carbon, PA-C  warfarin (COUMADIN) 2.5 MG tablet Take 5 tablets (12.5 mg total) by mouth daily at 6 PM. 07/14/13   Brittainy M Simmons, PA-C   BP 137/90 mmHg  Pulse 70  Temp(Src) 97.6 F (36.4 C) (Oral)  Resp 16  Ht  (1.753 m)  Wt 252 lb (114.306 kg)  BMI 37.20 kg/m2  SpO2 97% Physical Exam  Constitutional: He is oriented to person, place, and time. He appears well-developed and well-nourished. No distress.  HENT:  Head: Normocephalic and atraumatic.  Right Ear: Hearing normal.  Left Ear: Hearing normal.  Nose: Nose normal.  Mouth/Throat: Oropharynx is clear and moist and mucous membranes are normal.  Eyes: Conjunctivae and EOM are normal. Pupils are equal, round, and reactive to light.  Neck: Normal range of motion. Neck supple.  Cardiovascular: Regular rhythm, S1 normal and S2 normal.  Exam reveals no gallop and no friction rub.   No murmur  heard. Pulmonary/Chest: Effort normal and breath sounds normal. No respiratory distress. He exhibits no tenderness.  Abdominal: Soft. Normal appearance and bowel sounds are normal. There is no hepatosplenomegaly. There is no tenderness. There is no rebound, no guarding, no tenderness at McBurney's point and negative Murphy's sign. No hernia.  Musculoskeletal: Normal range of motion.  Neurological: He is alert and oriented to person, place, and time. He has normal strength. No cranial nerve deficit or sensory deficit. Coordination normal. GCS eye subscore is 4. GCS verbal subscore is 5. GCS motor subscore is 6.  Extraocular muscle movement: normal No visual field cut Pupils: equal and reactive both direct  and consensual response is normal No nystagmus present    Sensory function is intact to light touch, pinprick Proprioception intact  Grip strength 5/5 symmetric in upper extremities No pronator drift Normal finger to nose bilaterally  Lower extremity strength 5/5 against gravity Normal heel to shin bilaterally  Gait: normal   Skin: Skin is warm, dry and intact. No rash noted. No cyanosis.  Psychiatric: He has a normal mood and affect. His speech is normal and behavior is normal. Thought content normal.  Nursing note and vitals reviewed.   ED Course  Procedures (including critical care time) Labs Review Labs Reviewed  BASIC METABOLIC PANEL - Abnormal; Notable for the following:    Glucose, Bld 109 (*)    Creatinine, Ser 1.44 (*)    GFR calc non Af Amer 48 (*)    GFR calc Af Amer 56 (*)    All other components within normal limits  CBC WITH DIFFERENTIAL/PLATELET  PROTIME-INR  TROPONIN I    Imaging Review Ct Head Wo Contrast  03/08/2015   CLINICAL DATA:  Dizziness for 1 week, becoming worse.  EXAM: CT HEAD WITHOUT CONTRAST  TECHNIQUE: Contiguous axial images were obtained from the base of the skull through the vertex without intravenous contrast.  COMPARISON:  None.  FINDINGS:  Skull and Sinuses:Tiny metallic foreign body in central forehead. No fracture or destructive process. The mastoids and paranasal sinuses are clear.  Orbits: No acute abnormality.  Brain: No evidence of acute infarction, hemorrhage, hydrocephalus, or mass lesion/mass effect. Gliosis in the right frontal pole, involving a small volume of cortex and subcortical white matter. The gliosis could be ischemic or posttraumatic. Mild generalized cerebral volume.  IMPRESSION: 1. No acute intracranial findings. 2. Gliosis in the right frontal pole.   Electronically Signed   By: Marnee Spring M.D.   On: 03/08/2015 13:45     EKG Interpretation   Date/Time:  Thursday Mar 08 2015 13:42:49 EDT Ventricular Rate:  75 PR Interval:  144 QRS Duration: 88 QT Interval:  378 QTC Calculation: 422 R Axis:   12 Text Interpretation:  Normal sinus rhythm Septal infarct , age  undetermined Abnormal ECG Confirmed by Tam Savoia  MD, Sevilla Murtagh [id number]) on  03/08/2015 3:34:11 PM    me:  Thursday Mar 08 2015 13:42:49 EDT Ventricular Rate:  75 PR Interval:  144 QRS Duration: 88 QT Interval:  378 QTC Calculation: 422 R Axis:   12 Text Interpretation:  Normal sinus rhythm Septal infarct , age  undetermined Abnormal ECG Confirmed by Marvelle Span  MD, Antoino Westhoff [id number]) on  03/08/2015 3:34:11 PM      MDM   Final diagnoses:  Dizziness   vertigo  Patient presents to the ER for evaluation of dizziness. Patient reports progressively worsening episodes of dizziness for one week. He reports that he feels like he is spinning and it feels like the room is spinning around him. This is related to changes in position, especially rolling over in bed. This is consistent with a benign positional vertigo. His neurologic examination is entirely unremarkable. CT head was normal. Patient has had significant improvement with IV fluids, Valium and meclizine. Patient will be discharged, follow-up with primary doctor. Patient prescribed meclizine,  return if symptoms worsen.    Gilda Crease, MD 03/08/15 [phone]

## 2015-03-08 NOTE — ED Notes (Signed)
Pt in c/o dizziness and head pressure x 1 week, states feels like the room is spinning but is able to walk w/o problems until last 2 days. No facial droop, unilateral weakness, or neuro deficits noted in triage at this time.

## 2015-03-08 NOTE — Discharge Instructions (Signed)
Benign Positional Vertigo Vertigo means you feel like you or your surroundings are moving when they are not. Benign positional vertigo is the most common form of vertigo. Benign means that the cause of your condition is not serious. Benign positional vertigo is more common in older adults. CAUSES  Benign positional vertigo is the result of an upset in the labyrinth system. This is an area in the middle ear that helps control your balance. This may be caused by a viral infection, head injury, or repetitive motion. However, often no specific cause is found. SYMPTOMS  Symptoms of benign positional vertigo occur when you move your head or eyes in different directions. Some of the symptoms may include:  Loss of balance and falls.  Vomiting.  Blurred vision.  Dizziness.  Nausea.  Involuntary eye movements (nystagmus). DIAGNOSIS  Benign positional vertigo is usually diagnosed by physical exam. If the specific cause of your benign positional vertigo is unknown, your caregiver may perform imaging tests, such as magnetic resonance imaging (MRI) or computed tomography (CT). TREATMENT  Your caregiver may recommend movements or procedures to correct the benign positional vertigo. Medicines such as meclizine, benzodiazepines, and medicines for nausea may be used to treat your symptoms. In rare cases, if your symptoms are caused by certain conditions that affect the inner ear, you may need surgery. HOME CARE INSTRUCTIONS   Follow your caregiver's instructions.  Move slowly. Do not make sudden body or head movements.  Avoid driving.  Avoid operating heavy machinery.  Avoid performing any tasks that would be dangerous to you or others during a vertigo episode.  Drink enough fluids to keep your urine clear or pale yellow. SEEK IMMEDIATE MEDICAL CARE IF:   You develop problems with walking, weakness, numbness, or using your arms, hands, or legs.  You have difficulty speaking.  You develop  severe headaches.  Your nausea or vomiting continues or gets worse.  You develop visual changes.  Your family or friends notice any behavioral changes.  Your condition gets worse.  You have a fever.  You develop a stiff neck or sensitivity to light. MAKE SURE YOU:   Understand these instructions.  Will watch your condition.  Will get help right away if you are not doing well or get worse. Document Released: 07/28/2006 Document Revised: 01/12/2012 Document Reviewed: 07/10/2011 Exit[employer] Patient Information 2015 [employer]. This information is not intended to replace advice given to you by your health care provider. Make sure you discuss any questions you have with your health care provider.    

## 2017-03-03 ENCOUNTER — Inpatient Hospital Stay (HOSPITAL_COMMUNITY): Payer: Non-veteran care

## 2017-03-03 ENCOUNTER — Encounter (HOSPITAL_BASED_OUTPATIENT_CLINIC_OR_DEPARTMENT_OTHER): Payer: Self-pay | Admitting: Respiratory Therapy

## 2017-03-03 ENCOUNTER — Emergency Department (HOSPITAL_BASED_OUTPATIENT_CLINIC_OR_DEPARTMENT_OTHER): Payer: Non-veteran care

## 2017-03-03 ENCOUNTER — Inpatient Hospital Stay (HOSPITAL_BASED_OUTPATIENT_CLINIC_OR_DEPARTMENT_OTHER)
Admission: EM | Admit: 2017-03-03 | Discharge: 2017-03-07 | DRG: 065 | Disposition: A | Discharge status: Home / Self Care | Payer: Non-veteran care | Attending: Internal Medicine | Admitting: Internal Medicine

## 2017-03-03 DIAGNOSIS — R4701 Aphasia: Secondary | ICD-10-CM | POA: Diagnosis present

## 2017-03-03 DIAGNOSIS — R2990 Unspecified symptoms and signs involving the nervous system: Secondary | ICD-10-CM

## 2017-03-03 DIAGNOSIS — M109 Gout, unspecified: Secondary | ICD-10-CM | POA: Diagnosis present

## 2017-03-03 DIAGNOSIS — Z87891 Personal history of nicotine dependence: Secondary | ICD-10-CM

## 2017-03-03 DIAGNOSIS — I63411 Cerebral infarction due to embolism of right middle cerebral artery: Principal | ICD-10-CM | POA: Diagnosis present

## 2017-03-03 DIAGNOSIS — Z79899 Other long term (current) drug therapy: Secondary | ICD-10-CM

## 2017-03-03 DIAGNOSIS — I639 Cerebral infarction, unspecified: Secondary | ICD-10-CM | POA: Diagnosis not present

## 2017-03-03 DIAGNOSIS — Z7982 Long term (current) use of aspirin: Secondary | ICD-10-CM

## 2017-03-03 DIAGNOSIS — E118 Type 2 diabetes mellitus with unspecified complications: Secondary | ICD-10-CM | POA: Diagnosis not present

## 2017-03-03 DIAGNOSIS — G9389 Other specified disorders of brain: Secondary | ICD-10-CM | POA: Diagnosis present

## 2017-03-03 DIAGNOSIS — Z7902 Long term (current) use of antithrombotics/antiplatelets: Secondary | ICD-10-CM

## 2017-03-03 DIAGNOSIS — R791 Abnormal coagulation profile: Secondary | ICD-10-CM | POA: Diagnosis present

## 2017-03-03 DIAGNOSIS — I130 Hypertensive heart and chronic kidney disease with heart failure and stage 1 through stage 4 chronic kidney disease, or unspecified chronic kidney disease: Secondary | ICD-10-CM | POA: Diagnosis present

## 2017-03-03 DIAGNOSIS — Z955 Presence of coronary angioplasty implant and graft: Secondary | ICD-10-CM | POA: Diagnosis not present

## 2017-03-03 DIAGNOSIS — I5022 Chronic systolic (congestive) heart failure: Secondary | ICD-10-CM | POA: Diagnosis present

## 2017-03-03 DIAGNOSIS — E1122 Type 2 diabetes mellitus with diabetic chronic kidney disease: Secondary | ICD-10-CM | POA: Diagnosis present

## 2017-03-03 DIAGNOSIS — Z9861 Coronary angioplasty status: Secondary | ICD-10-CM | POA: Diagnosis not present

## 2017-03-03 DIAGNOSIS — I2510 Atherosclerotic heart disease of native coronary artery without angina pectoris: Secondary | ICD-10-CM | POA: Diagnosis present

## 2017-03-03 DIAGNOSIS — Z833 Family history of diabetes mellitus: Secondary | ICD-10-CM

## 2017-03-03 DIAGNOSIS — I236 Thrombosis of atrium, auricular appendage, and ventricle as current complications following acute myocardial infarction: Secondary | ICD-10-CM | POA: Diagnosis present

## 2017-03-03 DIAGNOSIS — G8191 Hemiplegia, unspecified affecting right dominant side: Secondary | ICD-10-CM | POA: Diagnosis present

## 2017-03-03 DIAGNOSIS — Z794 Long term (current) use of insulin: Secondary | ICD-10-CM

## 2017-03-03 DIAGNOSIS — N183 Chronic kidney disease, stage 3 unspecified: Secondary | ICD-10-CM | POA: Diagnosis present

## 2017-03-03 DIAGNOSIS — IMO0002 Reserved for concepts with insufficient information to code with codable children: Secondary | ICD-10-CM | POA: Diagnosis present

## 2017-03-03 DIAGNOSIS — E785 Hyperlipidemia, unspecified: Secondary | ICD-10-CM | POA: Diagnosis present

## 2017-03-03 DIAGNOSIS — Z8249 Family history of ischemic heart disease and other diseases of the circulatory system: Secondary | ICD-10-CM | POA: Diagnosis not present

## 2017-03-03 DIAGNOSIS — Z7901 Long term (current) use of anticoagulants: Secondary | ICD-10-CM

## 2017-03-03 DIAGNOSIS — R531 Weakness: Secondary | ICD-10-CM | POA: Diagnosis not present

## 2017-03-03 DIAGNOSIS — I6789 Other cerebrovascular disease: Secondary | ICD-10-CM | POA: Diagnosis not present

## 2017-03-03 DIAGNOSIS — I770 Arteriovenous fistula, acquired: Secondary | ICD-10-CM | POA: Diagnosis present

## 2017-03-03 DIAGNOSIS — I252 Old myocardial infarction: Secondary | ICD-10-CM | POA: Diagnosis not present

## 2017-03-03 DIAGNOSIS — I255 Ischemic cardiomyopathy: Secondary | ICD-10-CM | POA: Diagnosis present

## 2017-03-03 DIAGNOSIS — I10 Essential (primary) hypertension: Secondary | ICD-10-CM | POA: Diagnosis present

## 2017-03-03 DIAGNOSIS — I6340 Cerebral infarction due to embolism of unspecified cerebral artery: Secondary | ICD-10-CM | POA: Insufficient documentation

## 2017-03-03 DIAGNOSIS — Z820 Family history of epilepsy and other diseases of the nervous system: Secondary | ICD-10-CM

## 2017-03-03 DIAGNOSIS — E1165 Type 2 diabetes mellitus with hyperglycemia: Secondary | ICD-10-CM | POA: Diagnosis present

## 2017-03-03 DIAGNOSIS — I638 Other cerebral infarction: Secondary | ICD-10-CM | POA: Diagnosis not present

## 2017-03-03 LAB — COMPREHENSIVE METABOLIC PANEL
ALBUMIN: 3.8 g/dL (ref 3.5–5.0)
ALK PHOS: 132 U/L — AB (ref 38–126)
ALT: 22 U/L (ref 17–63)
ANION GAP: 8 (ref 5–15)
AST: 26 U/L (ref 15–41)
BILIRUBIN TOTAL: 0.5 mg/dL (ref 0.3–1.2)
BUN: 13 mg/dL (ref 6–20)
CALCIUM: 8.9 mg/dL (ref 8.9–10.3)
CO2: 22 mmol/L (ref 22–32)
Chloride: 110 mmol/L (ref 101–111)
Creatinine, Ser: 1.46 mg/dL — ABNORMAL HIGH (ref 0.61–1.24)
GFR calc Af Amer: 54 mL/min — ABNORMAL LOW (ref >60)
GFR calc non Af Amer: 47 mL/min — ABNORMAL LOW (ref >60)
GLUCOSE: 286 mg/dL — AB (ref 65–99)
Potassium: 4.2 mmol/L (ref 3.5–5.1)
SODIUM: 140 mmol/L (ref 135–145)
TOTAL PROTEIN: 7 g/dL (ref 6.5–8.1)

## 2017-03-03 LAB — RAPID URINE DRUG SCREEN, HOSP PERFORMED
Amphetamines: NOT DETECTED
BARBITURATES: NOT DETECTED
BENZODIAZEPINES: NOT DETECTED
COCAINE: NOT DETECTED
Opiates: NOT DETECTED
TETRAHYDROCANNABINOL: NOT DETECTED

## 2017-03-03 LAB — URINALYSIS, ROUTINE W REFLEX MICROSCOPIC
BACTERIA UA: NONE SEEN
BILIRUBIN URINE: NEGATIVE
Glucose, UA: >=500 mg/dL — AB
Hgb urine dipstick: NEGATIVE
Ketones, ur: NEGATIVE mg/dL
LEUKOCYTES UA: NEGATIVE
Nitrite: NEGATIVE
PH: 6 (ref 5.0–8.0)
Protein, ur: NEGATIVE mg/dL
RBC / HPF: NONE SEEN RBC/hpf (ref 0–5)
SPECIFIC GRAVITY, URINE: 1.015 (ref 1.005–1.030)
SQUAMOUS EPITHELIAL / LPF: NONE SEEN

## 2017-03-03 LAB — CBC
HCT: 41.8 % (ref 39.0–52.0)
HEMOGLOBIN: 13.8 g/dL (ref 13.0–17.0)
MCH: 31.7 pg (ref 26.0–34.0)
MCHC: 33 g/dL (ref 30.0–36.0)
MCV: 95.9 fL (ref 78.0–100.0)
PLATELETS: 167 10*3/uL (ref 150–400)
RBC: 4.36 MIL/uL (ref 4.22–5.81)
RDW: 14.2 % (ref 11.5–15.5)
WBC: 5.4 10*3/uL (ref 4.0–10.5)

## 2017-03-03 LAB — CBG MONITORING, ED: GLUCOSE-CAPILLARY: 252 mg/dL — AB (ref 65–99)

## 2017-03-03 LAB — DIFFERENTIAL
BASOS ABS: 0 10*3/uL (ref 0.0–0.1)
Basophils Relative: 1 %
Eosinophils Absolute: 0.2 10*3/uL (ref 0.0–0.7)
Eosinophils Relative: 3 %
LYMPHS ABS: 2.2 10*3/uL (ref 0.7–4.0)
LYMPHS PCT: 41 %
Monocytes Absolute: 0.4 10*3/uL (ref 0.1–1.0)
Monocytes Relative: 8 %
NEUTROS ABS: 2.5 10*3/uL (ref 1.7–7.7)
NEUTROS PCT: 47 %

## 2017-03-03 LAB — GLUCOSE, CAPILLARY
GLUCOSE-CAPILLARY: 165 mg/dL — AB (ref 65–99)
GLUCOSE-CAPILLARY: 130 mg/dL — AB (ref 65–99)
GLUCOSE-CAPILLARY: 114 mg/dL — AB (ref 65–99)
Glucose-Capillary: 214 mg/dL — ABNORMAL HIGH (ref 65–99)

## 2017-03-03 LAB — HEPARIN LEVEL (UNFRACTIONATED): Heparin Unfractionated: 0.99 IU/mL — ABNORMAL HIGH (ref 0.30–0.70)

## 2017-03-03 LAB — PROTIME-INR
INR: 0.97
PROTHROMBIN TIME: 12.9 s (ref 11.4–15.2)

## 2017-03-03 LAB — APTT: APTT: 32 s (ref 24–36)

## 2017-03-03 LAB — ETHANOL: Alcohol, Ethyl (B): <5 mg/dL (ref <5)

## 2017-03-03 LAB — TROPONIN I: Troponin I: <0.03 ng/mL (ref <0.03)

## 2017-03-03 MED ORDER — METHOCARBAMOL 750 MG PO TABS: 750.0000 mg | ORAL_TABLET | Freq: Two times a day (BID) | ORAL | Status: DC | PRN

## 2017-03-03 MED ORDER — ATORVASTATIN CALCIUM 80 MG PO TABS
80.0000 mg | ORAL_TABLET | Freq: Every day | ORAL | Status: DC
  Administered 2017-03-03 – 2017-03-06 (×4): 80 mg via ORAL
  Filled 2017-03-03 (×4): qty 1

## 2017-03-03 MED ORDER — HALOPERIDOL LACTATE 5 MG/ML IJ SOLN
1.0000 mg | Freq: Once | INTRAMUSCULAR | Status: AC
  Administered 2017-03-03: 1 mg via INTRAVENOUS
  Filled 2017-03-03: qty 1

## 2017-03-03 MED ORDER — WARFARIN SODIUM 5 MG PO TABS
10.0000 mg | ORAL_TABLET | Freq: Once | ORAL | Status: DC
  Filled 2017-03-03: qty 2

## 2017-03-03 MED ORDER — INSULIN GLARGINE 100 UNIT/ML ~~LOC~~ SOLN
15.0000 [IU] | Freq: Every day | SUBCUTANEOUS | Status: DC
  Administered 2017-03-03 – 2017-03-06 (×4): 15 [IU] via SUBCUTANEOUS
  Filled 2017-03-03 (×5): qty 0.15

## 2017-03-03 MED ORDER — HEPARIN (PORCINE) IN NACL 100-0.45 UNIT/ML-% IJ SOLN
1050.0000 [IU]/h | INTRAMUSCULAR | Status: DC
  Administered 2017-03-04: 1250 [IU]/h via INTRAVENOUS
  Administered 2017-03-04 – 2017-03-05 (×2): 1150 [IU]/h via INTRAVENOUS
  Administered 2017-03-06: 1050 [IU]/h via INTRAVENOUS
  Filled 2017-03-03 (×2): qty 250
  Filled 2017-03-03: qty 1000

## 2017-03-03 MED ORDER — NITROGLYCERIN 0.4 MG SL SUBL
0.4000 mg | SUBLINGUAL_TABLET | SUBLINGUAL | Status: DC | PRN
Start: 2017-03-03 — End: 2017-03-07

## 2017-03-03 MED ORDER — WARFARIN - PHARMACIST DOSING INPATIENT: Freq: Every day | Status: DC

## 2017-03-03 MED ORDER — ALLOPURINOL 100 MG PO TABS: 100.0000 mg | ORAL_TABLET | Freq: Every day | ORAL | Status: DC | PRN

## 2017-03-03 MED ORDER — STROKE: EARLY STAGES OF RECOVERY BOOK
Freq: Once | Status: AC
  Administered 2017-03-03: 11:00:00

## 2017-03-03 MED ORDER — INSULIN ASPART 100 UNIT/ML ~~LOC~~ SOLN
0.0000 [IU] | Freq: Three times a day (TID) | SUBCUTANEOUS | Status: DC
  Administered 2017-03-03: 1 [IU] via SUBCUTANEOUS
  Administered 2017-03-03: 2 [IU] via SUBCUTANEOUS
  Administered 2017-03-04 – 2017-03-05 (×2): 3 [IU] via SUBCUTANEOUS
  Administered 2017-03-05: 2 [IU] via SUBCUTANEOUS
  Administered 2017-03-05: 3 [IU] via SUBCUTANEOUS
  Administered 2017-03-06: 1 [IU] via SUBCUTANEOUS
  Administered 2017-03-06 – 2017-03-07 (×2): 2 [IU] via SUBCUTANEOUS

## 2017-03-03 MED ORDER — CARVEDILOL 3.125 MG PO TABS
3.1250 mg | ORAL_TABLET | Freq: Two times a day (BID) | ORAL | Status: DC
  Administered 2017-03-03 – 2017-03-07 (×7): 3.125 mg via ORAL
  Filled 2017-03-03 (×7): qty 1

## 2017-03-03 MED ORDER — POTASSIUM CHLORIDE CRYS ER 20 MEQ PO TBCR
20.0000 meq | EXTENDED_RELEASE_TABLET | Freq: Every day | ORAL | Status: DC
  Administered 2017-03-03 – 2017-03-07 (×4): 20 meq via ORAL
  Filled 2017-03-03 (×4): qty 1

## 2017-03-03 MED ORDER — LOSARTAN POTASSIUM 50 MG PO TABS: 100.0000 mg | ORAL_TABLET | Freq: Every day | ORAL | Status: DC

## 2017-03-03 MED ORDER — HEPARIN (PORCINE) IN NACL 100-0.45 UNIT/ML-% IJ SOLN
1500.0000 [IU]/h | INTRAMUSCULAR | Status: DC
  Administered 2017-03-03: 1500 [IU]/h via INTRAVENOUS
  Filled 2017-03-03: qty 250

## 2017-03-03 MED ORDER — ASPIRIN 81 MG PO CHEW: 81.0000 mg | CHEWABLE_TABLET | Freq: Every day | ORAL | Status: DC

## 2017-03-03 NOTE — ED Notes (Signed)
No changes, out with [facility]. 

## 2017-03-03 NOTE — ED Notes (Addendum)
No changes, continues to visit with son at [facility], words unintelligible and inappropriate, responds to commands appropriately, uses commands to answer questions, oriented x4. VSS, BP elevated, NAD, calm, skin W&D, no dyspnea. Tolerating PO water. Pt easily frustrated leading to loud and passionate communication, and boisterous excitement when he is successfully understood or gets a right word.

## 2017-03-03 NOTE — Evaluation (Signed)
Speech Language Pathology Evaluation Patient Details Name: Ralph Short MRN: [id number] DOB: 1946/04/02 Today's Date: 03/03/2017 Time: 7829-5621 SLP Time Calculation (min) (ACUTE ONLY): 25 min  Problem List:  Patient Active Problem List   Diagnosis Date Noted  . Right sided weakness 03/03/2017  . Cerebral embolism with cerebral infarction 03/03/2017  . Acute ischemic stroke (HCC) 03/03/2017  . Intracranial hemorrhage following injury, anterior right frontal lobe 09/07/13 09/13/2013  . Long term (current) use of anticoagulants 07/15/2013  . Anticoagulated on Coumadin for LVT 07/08/2013  . Cardiomyopathy, ischemic - s/p Anterior STEMI, EF 25-30% 07/30/13  . Acute systolic HF (heart failure) - s/p Anterior STEMI 07-30-13  . 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 At risk for sudden cardiac death 07/30/13  . CKD (chronic kidney disease) stage 3, GFR 30-59 ml/min 07/06/2013  . STEMI (ST elevation myocardial infarction)of ANT wall-total occ. of LAD  07/05/2013  . CAD S/P- PCI  (Xience DES) to prox LAD and mid LAD 07/05/13 07/05/2013  . Gout 07/05/2013  . DM (diabetes mellitus), type 2, uncontrolled, recently began insulin 07/05/2013  . HTN (hypertension) 07/05/2013  . History of agent Orange exposure 07/05/2013   Past Medical History:  Past Medical History:  Diagnosis Date  . CAD S/P percutaneous coronary angioplasty - PCI LAD LAD (Xience DES) to prox LAD and mid LAD 07/05/13 07/05/2013  . CKD (chronic kidney disease) stage 3, GFR 30-59 ml/min 07/06/2013  . Diabetes mellitus without complication (HCC)   . Diverticulitis   . Diverticulitis   . DM (diabetes mellitus), type 2, uncontrolled, recently began insulin 07/05/2013  . Gout 07/05/2013  . History of agent Orange exposure 07/05/2013  . Hypertension   . LV dysfunction, s/p MI, 07/05/13 07/05/2013  . Presence of drug coated stent in LAD coronary artery 07/05/2013   2 Xience Xpedition DES to prox & mid LAD -- in  STEMI   . STEMI (ST elevation myocardial infarction)of ANT wall-total occ. of LAD  07/05/2013   Past Surgical History:  Past Surgical History:  Procedure Laterality Date  . COLON SURGERY    . CORONARY ANGIOPLASTY WITH STENT PLACEMENT  07/05/13   emergency PCI and stenting in the setting of anterior STEMI a total proximal LAD with excellent angiographic result and a door to balloon time of 25 minutes.   Marland Kitchen KNEE SURGERY    . LEFT HEART CATHETERIZATION WITH CORONARY ANGIOGRAM N/A 07/05/2013   Procedure: LEFT HEART CATHETERIZATION WITH CORONARY ANGIOGRAM;  Surgeon: Runell Gess, MD;  Location: [facility] CATH LAB;  Service: Cardiovascular;  Laterality: N/A;   HPI:  71 y.o.malewith medical history significant of CAD, with STEMI treated with DES, LV thrombus development post MI - on coumadin therapy, systolic CHF with EF 25-30%, traumaticintracranial hemorrhage, DM type type II, hyperlipidemia, CKD who presented to the [facility] with c/o right sided weakness and speech deficit.   Assessment / Plan / Recommendation Clinical Impression  Pt presents with a mild-moderate primary expressive aphasia with a milder receptive component.  Expression is marked by mild dysfluency, naming deficits marked by semantic ("spoon" for "foot) and phonemic ("claxis" for "cactus") paraphasias; impaired repetition at the sentence level.  Auditory comprehension is c/b adequate yes/no reliability, difficulty following three-step commands.  Pt will benefit from aphasia therapy in the next venue of care.  Will await PT/OT evals re: their recommendations.     SLP Assessment  SLP Recommendation/Assessment: Patient needs continued Speech Lanaguage Pathology Services    Follow Up  Recommendations  Other (comment) (tba)    Frequency and Duration min 2x/week  2 weeks      SLP Evaluation Cognition  Overall Cognitive Status: Within Functional Limits for tasks assessed       Comprehension  Auditory  Comprehension Overall Auditory Comprehension: Impaired Yes/No Questions: Within Functional Limits Commands: Impaired Multistep Basic Commands: 50-74% accurate Conversation: Simple Visual Recognition/Discrimination Discrimination: Within Function Limits Reading Comprehension Reading Status: Not tested    Expression Expression Primary Mode of Expression: Verbal Verbal Expression Overall Verbal Expression: Impaired Initiation: No impairment Level of Generative/Spontaneous Verbalization: Conversation Repetition: Impaired Level of Impairment: Sentence level Naming: Impairment Responsive: 51-75% accurate Confrontation: Impaired Convergent: 50-74% accurate Verbal Errors: Semantic paraphasias;Phonemic paraphasias Pragmatics: No impairment Written Expression Dominant Hand: Right   Oral / Motor  Oral Motor/Sensory Function Overall Oral Motor/Sensory Function: Within functional limits Motor Speech Overall Motor Speech: Appears within functional limits for tasks assessed   GO                    Blenda Mounts Laurice 03/03/2017, 2:55 PM

## 2017-03-03 NOTE — Progress Notes (Signed)
ANTICOAGULATION CONSULT NOTE  Pharmacy Consult for heparin/warfarin Indication: LV thrombus  Heparin Dosing Weight: 98.5 kg  Assessment: 70 yom on warfarin PTA for LV thrombus admitted with ischemic stroke. Pharmacy consulted to dose heparin bridge (lower goal, no bolus in setting of stroke) + warfarin. Noted history of ICH in the past. INR subtherapeutic 0.97 on admit (pt states he is taking every day PTA? - awaiting [facility] with medications and son to bring in medications from home). CBC wnl. No bleed documented.   Goal of Therapy:  INR 2-3 Heparin level 0.3-0.5 units/ml, no bolus Monitor platelets by anticoagulation protocol: Yes   Plan:  No bolus Start heparin at 1500 units/h Warfarin 10mg  PO x 1 dose 8h heparin level Daily heparin level/CBC Monitor s/sx bleeding   Babs Bertin, PharmD, BCPS Clinical Pharmacist 03/03/2017 9:30 AM

## 2017-03-03 NOTE — Care Management Note (Addendum)
Case Management Note  Patient Details  Name: Ralph Short MRN: [id number] Date of Birth: June 26, 1946  Subjective/Objective:    Pt in to r/o CVA. Pt is from home with family.  Pt with [facility] benefits at 70%.               Action/Plan: Awaiting MRI and PT/OT recommendations. CM following for d/c needs, physician orders. CM notified Kirkland Hun at [facility] of his admission. Pts PCP at the [place] is Dr Sherrie Mustache.  CM will update [facility] of any d/c needs.   Expected Discharge Date:                  Expected Discharge Plan:     In-House Referral:     Discharge planning Services     Post Acute Care Choice:    Choice offered to:     DME Arranged:    DME Agency:     HH Arranged:    HH Agency:     Status of Service:  In process, will continue to follow  If discussed at Long Length of Stay Meetings, dates discussed:    Additional Comments:  Kermit Balo, RN 03/03/2017, 2:31 PM

## 2017-03-03 NOTE — ED Notes (Signed)
Alert, NAD, calm, interactive, resps e/u, talkative, no dyspnea noted, skin W&D, VSS, BP elevated, EDP Dr. Nicanor Alcon into room, at [facility]. Family at [facility]. Triage continues.

## 2017-03-03 NOTE — Progress Notes (Signed)
This is a no charge note  Transfer from [facility] per dr. Nicanor Alcon  71 year old man with past medical history of CAD, STEMI, s/p of DES placement, left ventricle apical thrombus following anterior STEMI on coumadin, ICH, KCD-III, gout, HLD, DM-II, chronic sCHF with EF 25-30%, who presents with right-sided weakness. Last known normal at 5 PM.  Patient was found to have INR 0.97, negative troponin, WBC 5.4, stable renal function, alcohol level less than 5, temperature normal, oxygen saturation 97% on room air, CT head is negative for acute intracranial abnormalities. Patient is accepted to telemetry bed as inpatient. Neurology, Dr. Otelia Limes was consulted. Patient is not a candidate for tPA due to previous intracranial hemorrhage.  Lorretta Harp, MD  [facility] Pager [phone]  If 7PM-7AM, please contact night-coverage [email or web address] Password [email or web address] 03/03/2017, 2:24 AM

## 2017-03-03 NOTE — Progress Notes (Signed)
STROKE TEAM PROGRESS NOTE   SUBJECTIVE (INTERVAL HISTORY)  He states his expressive language difficulties still persists but slightly improved. He has not had an MRI done yet.   OBJECTIVE Temp:  [97.5 F (36.4 C)-98.4 F (36.9 C)] 98.4 F (36.9 C) (05/01 0930) Pulse Rate:  [74-120] 120 (05/01 0930) Cardiac Rhythm: Normal sinus rhythm (05/01 0930) Resp:  [15-22] 18 (05/01 0930) BP: (118-178)/(71-110) 118/71 (05/01 0930) SpO2:  [95 %-100 %] 100 % (05/01 0930) Weight:  [115.3 kg (254 lb 3.2 oz)] 115.3 kg (254 lb 3.2 oz) (05/01 0451)  CBC:  Recent Labs Lab 03/03/17 0058  WBC 5.4  NEUTROABS 2.5  HGB 13.8  HCT 41.8  MCV 95.9  PLT 167    Basic Metabolic Panel:  Recent Labs Lab 03/03/17 0058  NA 140  K 4.2  CL 110  CO2 22  GLUCOSE 286*  BUN 13  CREATININE 1.46*  CALCIUM 8.9    Lipid Panel:    Component Value Date/Time   CHOL 137 07/06/2013 0400   TRIG 134 07/06/2013 0400   HDL 29 (L) 07/06/2013 0400   CHOLHDL 4.7 07/06/2013 0400   VLDL 27 07/06/2013 0400   LDLCALC 81 07/06/2013 0400   HgbA1c:  Lab Results  Component Value Date   HGBA1C 9.9 (H) 07/05/2013    PHYSICAL EXAM Obese elderly African-American male currently not in distress. . Afebrile. Head is nontraumatic. Neck is supple without bruit.    Cardiac exam no murmur or gallop. Lungs are clear to auscultation. Distal pulses are well felt. Neurological Exam ;  Awake  Alert oriented x 3. nonfluent speech with moderate expressive aphasia with word finding difficulties and few paraphasic errors. Good comprehension. Difficulty with naming and repetition.Marland [place] movements full without nystagmus.fundi were not visualized. Vision acuity and fields appear normal. Hearing is normal. Palatal movements are normal. Face symmetric. Tongue midline. Normal strength, tone, reflexes and coordination. Normal sensation. Gait deferred.  ASSESSMENT/PLAN Mr. EFRAIM TEUBNER is a 71 y.o. male with history of CAD with STEMI treated  with DES,  LV thrombus development post MI - on coumadin therapy, systolic CHF with EF 25-30%, intracranial hemorrhage, DM type II, hyperlipidemia, CKD  presenting with R sided weakness and aphasia/dysarthria. He did not receive IV t-PA due to  Late presentation and h/o traumatic ICH.   Left MCA branch infarct infarct embolic secondary to known cardiac thrombus with subtherapeutic INR on coumadin  Resultant  Expressive aphasia  Code Stroke CT no acute stroke. Encephalomalacia R frontal love. Old L occipital infarct. Aspects 10.    MRI  Ordered   MRA head  pending   MRA neck  pending   2D Echo  pending   LDL pending   HgbA1c pending  Warfarin for VTE prophylaxis  Diet NPO time specified  aspirin 81 mg daily, clopidogrel 75 mg daily and warfarin daily prior to admission, now on no antithrombotics  Now on iv heparin bridge with warfarin  Therapy recommendations:  pending   Disposition:  Pending   LV thrombus  On coumadin along with aspirin and plavix  INR 0.97, subtherapeutic  Hypertension  Elevated as high as 178/110  Significantly lower this am at 118/71 With stroke dx, Permissive hypertension (OK if < 220/120) but gradually normalize in 5-7 days Long-term BP goal normotensive  Hyperlipidemia  Home meds:  lipitor 80, resumed in hospital  LDL pending   Continue statin at discharge  Diabetes type II  HgbA1c pending, goal < 7.0  Other Stroke Risk Factors  Advanced age  Former Cigarette smoker  UDS / ETOH level negative   Obesity, Body mass index is 36.47 kg/m.  Hx traumatic hemorrhage  Coronary artery disease s/p MI w/ DES  Cardiac thrombus post MI on warfarin   Systolic CHF  Other Active Problems  CKD stage III  Gout  Hospital day # 0  I have personally examined this patient, reviewed notes, independently viewed imaging studies, participated in medical decision making and plan of care.ROS completed by me personally and pertinent  positives fully documented  I have made any additions or clarifications directly to the above note. He presented with expressive speech difficulties and right-sided weakness and presented late but has a history of intracerebral hemorrhage hence was not considered for thrombolysis. He has known history of left lenticular of Vicryl thrombolysis and was anticoagulated with warfarin but INR was subtherapeutic on presentation. Recommend start IV heparin and warfarin. Check MRI scan and continue stroke workup. Greater than 50% time during this 35 minute visit was spent on counseling and coordination of care about stroke and TIA risk, cardiac thrombus treatment discussion and answering questions  Delia Heady, MD Medical Director [facility] Pager: [phone] 03/03/2017 2:51 PM   To contact Stroke Continuity provider, please refer to [email or web address]. After hours, contact General Neurology

## 2017-03-03 NOTE — H&P (Signed)
History and Physical    Ralph Short:[id number] DOB: 09-18-1946 DOA: 03/03/2017  PCP: No PCP Per Patient  Patient coming from: [facility]  Chief Complaint: right sided weakness and aphasia/dysarthria  HPI: Ralph Short is a 71 y.o. male with medical history significant of CAD, with STEMI treated with DES,  LV thrombus development post MI - on coumadin therapy, systolic CHF with EF 25-30%, traumatic intracranial hemorrhage, DM type type II, hyperlipidemia, CKD who presented to the [facility]  with c/o right sided weakness and speech deficit. Patient last seen normal around 5 pm yesterday and onset of symptoms was between 8:30 and 11PM last night per records from outside facillity  Patient was transferred to [facility] for further evaluation and treatment. He has been seen by neurology and tPa was not initiated d/t unclear time criteria and previous history of stroke During my assessment patient was alert and oriented, denied pain , weakness or numbness in the right side, was able to speek but the speech was garbled and pthis made patient very upset.  ED Course: on arrival patient had stable VS, blood work demonstrated  creatinine at baseline - 1.46, normal CBC, subtherapeutic INR 0.97 Head CT revealed no acute intracranial hemorrhage, no acute infarcts, right frontal lobe encephalomalacia and small left occipital lobe infarct   Review of Systems: As per HPI otherwise 10 point review of systems negative.   Ambulatory Status: Independent at baseline  Past Medical History:  Diagnosis Date  . CAD S/P percutaneous coronary angioplasty - PCI LAD LAD (Xience DES) to prox LAD and mid LAD 07/05/13 07/05/2013  . CKD (chronic kidney disease) stage 3, GFR 30-59 ml/min 07/06/2013  . Diabetes mellitus without complication (HCC)   . Diverticulitis   . Diverticulitis   . DM (diabetes mellitus), type 2, uncontrolled, recently began insulin 07/05/2013  . Gout 07/05/2013  . History  of agent Orange exposure 07/05/2013  . Hypertension   . LV dysfunction, s/p MI, 07/05/13 07/05/2013  . Presence of drug coated stent in LAD coronary artery 07/05/2013   2 Xience Xpedition DES to prox & mid LAD -- in STEMI   . STEMI (ST elevation myocardial infarction)of ANT wall-total occ. of LAD  07/05/2013    Past Surgical History:  Procedure Laterality Date  . COLON SURGERY    . CORONARY ANGIOPLASTY WITH STENT PLACEMENT  07/05/13   emergency PCI and stenting in the setting of anterior STEMI a total proximal LAD with excellent angiographic result and a door to balloon time of 25 minutes.   Marland Kitchen KNEE SURGERY    . LEFT HEART CATHETERIZATION WITH CORONARY ANGIOGRAM N/A 07/05/2013   Procedure: LEFT HEART CATHETERIZATION WITH CORONARY ANGIOGRAM;  Surgeon: Runell Gess, MD;  Location: [facility] CATH LAB;  Service: Cardiovascular;  Laterality: N/A;    Social History   Social History  . Marital status: Single    Spouse name: N/A  . Number of children: N/A  . Years of education: N/A   Occupational History  . Not on file.   Social History Main Topics  . Smoking status: Former Smoker    Quit date: 11/03/1992  . Smokeless tobacco: Never Used  . Alcohol use No  . Drug use: No  . Sexual activity: Not on file   Other Topics Concern  . Not on file   Social History Narrative  . No narrative on file    Allergies  Allergen Reactions  . Penicillins Hives    Family  History  Problem Relation Age of Onset  . Alzheimer's disease Mother   . Hyperlipidemia Mother   . Hypertension Mother   . Diabetes type II Mother   . Hypertension Father   . Alzheimer's disease Father   . Diabetes type II Father   . Diabetes type II Sister   . Hypertension Sister   . Heart disease Sister   . Heart disease Brother   . Hypertension Brother   . Diabetes type II Brother   . Diabetes type II Sister   . Hypertension Sister   . Diabetes type II Sister   . Hypertension Sister   . Heart disease Sister   . Diabetes type  II Sister   . Hypertension Sister   . Diabetes type II Sister   . Hypertension Sister   . Diabetes type II Sister   . Hypertension Sister   . Hypertension Brother   . Diabetes type II Brother   . Hypertension Brother   . Diabetes type II Brother   . Spina bifida Son     Prior to Admission medications   Medication Sig Start Date End Date Taking? Authorizing Provider  allopurinol (ZYLOPRIM) 100 MG tablet Take 100 mg by mouth daily as needed (for gout).    Historical Provider, MD  aspirin 81 MG chewable tablet Chew 1 tablet (81 mg total) by mouth daily. 07/14/13   Brittainy Sherlynn Carbon, PA-C  atorvastatin (LIPITOR) 80 MG tablet Take 1 tablet (80 mg total) by mouth daily at 6 PM. 07/14/13   Brittainy M Sharol Harness, PA-C  carvedilol (COREG) 3.125 MG tablet Take 1 tablet (3.125 mg total) by mouth 2 (two) times daily with a meal. 07/14/13   Brittainy Sherlynn Carbon, PA-C  clopidogrel (PLAVIX) 75 MG tablet Take 1 tablet (75 mg total) by mouth daily. 09/13/13   Dwana Melena, PA-C  glipiZIDE (GLUCOTROL) 10 MG tablet Take 10 mg by mouth 2 (two) times daily before a meal.    Historical Provider, MD  HYDROcodone-acetaminophen (NORCO/VICODIN) 5-325 MG per tablet Take 1 tablet by mouth 2 (two) times daily as needed for pain.    Historical Provider, MD  insulin glargine (LANTUS) 100 UNIT/ML injection Inject 15 Units into the skin at bedtime.    Historical Provider, MD  losartan (COZAAR) 100 MG tablet Take 100 mg by mouth daily.    Historical Provider, MD  meclizine (ANTIVERT) 25 MG tablet Take 1 tablet (25 mg total) by mouth 3 (three) times daily as needed for dizziness. 03/08/15   Gilda Crease, MD  methocarbamol (ROBAXIN) 750 MG tablet Take 750 mg by mouth 2 (two) times daily as needed (for muscle spasms.).    Historical Provider, MD  nitroGLYCERIN (NITROSTAT) 0.4 MG SL tablet Place 1 tablet (0.4 mg total) under the tongue every 5 (five) minutes as needed for chest pain. 07/14/13   Brittainy Sherlynn Carbon, PA-C    potassium chloride SA (K-DUR,KLOR-CON) 20 MEQ tablet Take 1 tablet (20 mEq total) by mouth daily. 07/14/13   Brittainy Sherlynn Carbon, PA-C  warfarin (COUMADIN) 2.5 MG tablet Take 5 tablets (12.5 mg total) by mouth daily at 6 PM. 07/14/13   Allayne Butcher, PA-C    Physical Exam: Vitals:   03/03/17 0300 03/03/17 0330 03/03/17 0451 03/03/17 0650  BP: (!) 156/96 (!) 158/93 (!) 167/86 (!) 159/85  Pulse: 77 79 74 77  Resp: 16 15 20 20   Temp:   97.5 F (36.4 C) 97.8 F (36.6 C)  TempSrc:   Oral  Oral  SpO2: 96% 96% 97% 95%  Weight:   115.3 kg (254 lb 3.2 oz)   Height:   5\' 10"  (1.778 m)      General: Appears calm and comfortable Eyes: PERRLA, EOMI, normal lids, iris ENT:  grossly normal hearing, lips & tongue, mucous membranes moist and intact Neck: no lymphoadenopathy, masses or thyromegaly Cardiovascular: RRR, no m/r/g. No JVD, carotid bruits. No LE edema.  Respiratory: bilateral no wheezes, rales, rhonchi or cracles. Normal respiratory effort. No accessory muscle use observed Abdomen: soft, non-tender, non-distended, no organomegaly or masses appreciated. BS present in all quadrants Skin: no rash, ulcers or induration seen on limited exam Musculoskeletal: grossly normal tone BUE/BLE, good ROM, no bony abnormality or joint deformities observed Psychiatric: grossly normal mood and affect, speech fluent and appropriate, alert and oriented x3 Neurologic: CN II-XII grossly intact, moves all extremities in coordinated fashion, sensation intact  Labs on Admission: I have personally reviewed following labs and imaging studies  CBC, BMP  GFR: Estimated Creatinine Clearance: 59.9 mL/min (A) (by C-G formula based on SCr of 1.46 mg/dL (H)).   Creatinine Clearance: Estimated Creatinine Clearance: 59.9 mL/min (A) (by C-G formula based on SCr of 1.46 mg/dL (H)).    Radiological Exams on Admission: Ct Head Code Stroke W/o Cm  Result Date: 03/03/2017 CLINICAL DATA:  Code stroke. Initial  evaluation for acute right-sided weakness. EXAM: CT HEAD WITHOUT CONTRAST TECHNIQUE: Contiguous axial images were obtained from the base of the skull through the vertex without intravenous contrast. COMPARISON:  Prior CT from 03/08/2015. FINDINGS: Brain: Generalized age-related cerebral atrophy. Chronic encephalomalacia within the anterior inferior right frontal lobe, unchanged. Small remote left occipital lobe infarct noted as well. No acute intracranial hemorrhage. No evidence for acute large vessel territory infarct. No mass lesion, midline shift or mass effect. No hydrocephalus. No extra-axial fluid collection. Vascular: No asymmetric hyperdense vessel. Scattered vascular calcifications noted within the carotid siphons. Skull: Scalp soft tissues demonstrate no acute abnormality. Calvarium intact. Sinuses/Orbits: Globes and orbital soft tissues within normal limits. Paranasal sinuses are clear. No mastoid effusion. ASPECTS [facility] Stroke Program Early CT Score) - Ganglionic level infarction (caudate, lentiform nuclei, internal capsule, insula, M1-M3 cortex): 7 - Supraganglionic infarction (M4-M6 cortex): 3 Total score (0-10 with 10 being normal): 10 IMPRESSION: 1. No acute intracranial infarct or other process identified. 2. ASPECTS is 10 3. Encephalomalacia within the anterior right frontal lobe, stable. 4. Small remote left occipital lobe infarct, also unchanged. Critical Value/emergent results were called by telephone at the time of interpretation on 03/03/2017 at 1:37 am to Dr. Cy Blamer , who verbally acknowledged these results. Electronically Signed   By: Rise Mu M.D.   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   Long term (current) use of anticoagulants   Right sided weakness    Acute ischemic stroke with right sided weakness Patient has been seen by neurology, recommended MRI/MRA of the brain, neck MRA with contrast, TTE, telemetry monitoring Hgb A1C and fasting lipids are pending His right sided weakness resolved, but his speech is still garbled  LV thrombus - possible source of embolic stroke Patient is on Coumadin, but INR was subtherapeutic He is on Heparin drip and Coumadin per pharmacy with goal INR 2-3  CAD with DES placement in 2014 Unclear if patient is on Plavix in addition to Coumadin at home; awaiting for the response from [place] system for med reconciliation to be completed No current anginal symptoms Continue to monitor   HTN Continue home medications and adjust doses as needed  Patient is put of window for permissive HTN  Hyperlipidemia Continue statin therapy Lipid profile is pending Adjust dose of statin of needed  DM type II - Hgb a1C is pending Vontinue home dose Lantus, start SSI  Gout Stable, no signs of flair Continue Allopurinol renal dose  DVT prophylaxis: Coumadin  Code Status: full Family Communication: not contacted at this time Disposition Plan: telemetry Consults called: neuro Admission status: inpatient   Raymon Mutton, [place] Pager: [phone] [facility]  If 7PM-7AM, please contact night-coverage [email or web address] Password [facility]  03/03/2017, 9:47 AM

## 2017-03-03 NOTE — ED Triage Notes (Signed)
Pt reports right sided weakness and expressive dysphagia. Son reports pt was last seen normal at 5pm (7 hours ago). Pt able to move all extremities but reports weakness on right sided.

## 2017-03-03 NOTE — Consult Note (Signed)
Referring Physician: Dr. Hal Hope    Chief Complaint: Speech deficit and right sided weakness  HPI: Ralph Short is an 71 y.o. male who presented to the [place] ED with a c/c of acute onset right sided weakness. Time of onset was somewhere between 8:25 and 11 PM on Monday per OSH records. LKN was 5 PM. On interview with Neurologist at [facility], he also endorses speech difficulty, which began at the same time as the weakness. He was deemed not to be an IV tPA candidate due to time criteria as well as history of ICH.   He has no prior history of stroke. 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 His PMHx also includes CAD with STEMI s/p drug eluting stent placement, ischemic cardiomyopathy, DM, CKD, HLD, chronic CHF with low EF and left ventricular apex thrombus. He has been anticoagulated with Coumadin for the latter. His INR was subtherapeutic (0.97) at the outside hospital. His home medications list also includes ASA and Plavix.   Past Medical History:  Diagnosis Date  . CAD S/P percutaneous coronary angioplasty - PCI LAD LAD (Xience DES) to prox LAD and mid LAD 07/05/13 07/05/2013  . CKD (chronic kidney disease) stage 3, GFR 30-59 ml/min 07/06/2013  . Diabetes mellitus without complication ([place])   . Diverticulitis   . Diverticulitis   . DM (diabetes mellitus), type 2, uncontrolled, recently began insulin 07/05/2013  . Gout 07/05/2013  . History of agent Orange exposure 07/05/2013  . Hypertension   . LV dysfunction, s/p MI, 07/05/13 07/05/2013  . Presence of drug coated stent in LAD coronary artery 07/05/2013   2 Xience Xpedition DES to prox & mid LAD -- in STEMI   . STEMI (ST elevation myocardial infarction)of ANT wall-total occ. of LAD  07/05/2013    Past Surgical History:  Procedure Laterality Date  . COLON SURGERY    . CORONARY ANGIOPLASTY WITH STENT PLACEMENT  07/05/13   emergency PCI and stenting in the setting of anterior STEMI a total proximal LAD with  excellent angiographic result and a door to balloon time of 25 minutes.   Mar[facility] Kitchen KNEE SURGERY    . LEFT HEART CATHETERIZATION WITH CORONARY ANGIOGRAM N/A 07/05/2013   Procedure: LEFT HEART CATHETERIZATION WITH CORONARY ANGIOGRAM;  Surgeon: Lorretta Harp, MD;  Location: [facility] CATH LAB;  Service: Cardiovascular;  Laterality: N/A;    Family History  Problem Relation Age of Onset  . Alzheimer's disease Mother   . Hyperlipidemia Mother   . Hypertension Mother   . Diabetes type II Mother   . Hypertension Father   . Alzheimer's disease Father   . Diabetes type II Father   . Diabetes type II Sister   . Hypertension Sister   . Heart disease Sister   . Heart disease Brother   . Hypertension Brother   . Diabetes type II Brother   . Diabetes type II Sister   . Hypertension Sister   . Diabetes type II Sister   . Hypertension Sister   . Heart disease Sister   . Diabetes type II Sister   . Hypertension Sister   . Diabetes type II Sister   . Hypertension Sister   . Diabetes type II Sister   . Hypertension Sister   . Hypertension Brother   . Diabetes type II Brother   . Hypertension Brother   . Diabetes type II Brother   . Spina bifida Son    Social History:  reports  that he quit smoking about 24 years ago. He has never used smokeless tobacco. He reports that he does not drink alcohol or use drugs.  Allergies:  Allergies  Allergen Reactions  . Penicillins Hives    Medications:  Prior to Admission:  Prescriptions Prior to Admission  Medication Sig Dispense Refill Last Dose  . allopurinol (ZYLOPRIM) 100 MG tablet Take 100 mg by mouth daily as needed (for gout).   Taking  . aspirin 81 MG chewable tablet Chew 1 tablet (81 mg total) by mouth daily.   Not Taking  . atorvastatin (LIPITOR) 80 MG tablet Take 1 tablet (80 mg total) by mouth daily at 6 PM. 30 tablet 5 Taking  . carvedilol (COREG) 3.125 MG tablet Take 1 tablet (3.125 mg total) by mouth 2 (two) times daily with a meal. 60 tablet 5  Not Taking  . clopidogrel (PLAVIX) 75 MG tablet Take 1 tablet (75 mg total) by mouth daily. 90 tablet 3   . glipiZIDE (GLUCOTROL) 10 MG tablet Take 10 mg by mouth 2 (two) times daily before a meal.   Taking  . HYDROcodone-acetaminophen (NORCO/VICODIN) 5-325 MG per tablet Take 1 tablet by mouth 2 (two) times daily as needed for pain.   Taking  . insulin glargine (LANTUS) 100 UNIT/ML injection Inject 15 Units into the skin at bedtime.   Taking  . losartan (COZAAR) 100 MG tablet Take 100 mg by mouth daily.   Taking  . meclizine (ANTIVERT) 25 MG tablet Take 1 tablet (25 mg total) by mouth 3 (three) times daily as needed for dizziness. 20 tablet 0   . methocarbamol (ROBAXIN) 750 MG tablet Take 750 mg by mouth 2 (two) times daily as needed (for muscle spasms.).   Taking  . nitroGLYCERIN (NITROSTAT) 0.4 MG SL tablet Place 1 tablet (0.4 mg total) under the tongue every 5 (five) minutes as needed for chest pain. 25 tablet 2 Taking  . potassium chloride SA (K-DUR,KLOR-CON) 20 MEQ tablet Take 1 tablet (20 mEq total) by mouth daily. 30 tablet 5 Taking  . warfarin (COUMADIN) 2.5 MG tablet Take 5 tablets (12.5 mg total) by mouth daily at 6 PM. 150 tablet 5 Not Taking    ROS: Poor historian due to dysphasia. In this context he denies headache, neck pain, chest pain or abdominal pain. Denies vision changes. Other ROS as per HPI.   Physical Examination: Blood pressure (!) 167/86, pulse 74, temperature 97.5 F (36.4 C), temperature source Oral, resp. rate 20, height '5\' 10"'$  (1.778 m), weight 115.3 kg (254 lb 3.2 oz), SpO2 97 %.  HEENT: [place]/AT Lungs: Respirations unlabored Ext: Warm and well perfused  Neurologic Examination: Mental Status: Alert, oriented to self, "Wednesday", circumstance and location. Dense expressive aphasia in conjunction with mild/moderate receptive aphasia with loss of fluency, word finding difficulty, occasional phonemic and semantic paraphasias, impaired repetition, difficulty naming and  mild comprehension deficit. Cranial Nerves: II:  Visual fields intact, PERRL  III,IV, VI: ptosis not present, EOMI without nystagmus V,VII: smile symmetric, facial temp sensation normal bilaterally VIII: hearing intact to voice IX,X: no hypophonia or hoarseness XI: symmetric XII: midline tongue extension  Motor:  RUE: Subtle 4+/5 weakness proximally and distally RLE: Subtle 4+/5 weakness proximally and distally LUE and LLE: 5/5 Tone and bulk normal x 4 Sensory: Temperature sensation decreased to RUE and RLE. FT intact x 4. Positive for extinction on the right.  Deep Tendon Reflexes: 2+ bilateral upper and lower extremities. Cerebellar: No ataxia with FNF bilaterally Gait: Deferred  Results for orders placed or performed during the hospital encounter of 03/03/17 (from the past 48 hour(s))  CBG monitoring, ED     Status: Abnormal   Collection Time: 03/03/17 12:57 AM  Result Value Ref Range   Glucose-Capillary 252 (H) 65 - 99 mg/dL  Ethanol     Status: None   Collection Time: 03/03/17 12:58 AM  Result Value Ref Range   Alcohol, Ethyl (B) <5 <5 mg/dL    Comment:        LOWEST DETECTABLE LIMIT FOR SERUM ALCOHOL IS 5 mg/dL FOR MEDICAL PURPOSES ONLY   Protime-INR     Status: None   Collection Time: 03/03/17 12:58 AM  Result Value Ref Range   Prothrombin Time 12.9 11.4 - 15.2 seconds   INR 0.97   APTT     Status: None   Collection Time: 03/03/17 12:58 AM  Result Value Ref Range   aPTT 32 24 - 36 seconds  CBC     Status: None   Collection Time: 03/03/17 12:58 AM  Result Value Ref Range   WBC 5.4 4.0 - 10.5 K/uL   RBC 4.36 4.22 - 5.81 MIL/uL   Hemoglobin 13.8 13.0 - 17.0 g/dL   HCT 41.8 39.0 - 52.0 %   MCV 95.9 78.0 - 100.0 fL   MCH 31.7 26.0 - 34.0 pg   MCHC 33.0 30.0 - 36.0 g/dL   RDW 14.2 11.5 - 15.5 %   Platelets 167 150 - 400 K/uL  Differential     Status: None   Collection Time: 03/03/17 12:58 AM  Result Value Ref Range   Neutrophils Relative % 47 %   Neutro Abs  2.5 1.7 - 7.7 K/uL   Lymphocytes Relative 41 %   Lymphs Abs 2.2 0.7 - 4.0 K/uL   Monocytes Relative 8 %   Monocytes Absolute 0.4 0.1 - 1.0 K/uL   Eosinophils Relative 3 %   Eosinophils Absolute 0.2 0.0 - 0.7 K/uL   Basophils Relative 1 %   Basophils Absolute 0.0 0.0 - 0.1 K/uL  Comprehensive metabolic panel     Status: Abnormal   Collection Time: 03/03/17 12:58 AM  Result Value Ref Range   Sodium 140 135 - 145 mmol/L   Potassium 4.2 3.5 - 5.1 mmol/L   Chloride 110 101 - 111 mmol/L   CO2 22 22 - 32 mmol/L   Glucose, Bld 286 (H) 65 - 99 mg/dL   BUN 13 6 - 20 mg/dL   Creatinine, Ser 1.46 (H) 0.61 - 1.24 mg/dL   Calcium 8.9 8.9 - 10.3 mg/dL   Total Protein 7.0 6.5 - 8.1 g/dL   Albumin 3.8 3.5 - 5.0 g/dL   AST 26 15 - 41 U/L   ALT 22 17 - 63 U/L   Alkaline Phosphatase 132 (H) 38 - 126 U/L   Total Bilirubin 0.5 0.3 - 1.2 mg/dL   GFR calc non Af Amer 47 (L) >60 mL/min   GFR calc Af Amer 54 (L) >60 mL/min    Comment: (NOTE) The eGFR has been calculated using the CKD EPI equation. This calculation has not been validated in all clinical situations. eGFR's persistently <60 mL/min signify possible Chronic Kidney Disease.    Anion gap 8 5 - 15  Troponin I     Status: None   Collection Time: 03/03/17 12:58 AM  Result Value Ref Range   Troponin I <0.03 <0.03 ng/mL  Glucose, capillary     Status: Abnormal   Collection Time: 03/03/17  4:48 AM  Result Value Ref Range   Glucose-Capillary 214 (H) 65 - 99 mg/dL   Comment 1 Notify RN    Comment 2 Document in Chart   Urinalysis, Routine w reflex microscopic     Status: Abnormal   Collection Time: 03/03/17  5:36 AM  Result Value Ref Range   Color, Urine STRAW (A) YELLOW   APPearance CLEAR CLEAR   Specific Gravity, Urine 1.015 1.005 - 1.030   pH 6.0 5.0 - 8.0   Glucose, UA >=500 (A) NEGATIVE mg/dL   Hgb urine dipstick NEGATIVE NEGATIVE   Bilirubin Urine NEGATIVE NEGATIVE   Ketones, ur NEGATIVE NEGATIVE mg/dL   Protein, ur NEGATIVE  NEGATIVE mg/dL   Nitrite NEGATIVE NEGATIVE   Leukocytes, UA NEGATIVE NEGATIVE   RBC / HPF NONE SEEN 0 - 5 RBC/hpf   WBC, UA 0-5 0 - 5 WBC/hpf   Bacteria, UA NONE SEEN NONE SEEN   Squamous Epithelial / LPF NONE SEEN NONE SEEN   Mucous PRESENT   Rapid urine drug screen (hospital performed)     Status: None   Collection Time: 03/03/17  5:36 AM  Result Value Ref Range   Opiates NONE DETECTED NONE DETECTED   Cocaine NONE DETECTED NONE DETECTED   Benzodiazepines NONE DETECTED NONE DETECTED   Amphetamines NONE DETECTED NONE DETECTED   Tetrahydrocannabinol NONE DETECTED NONE DETECTED   Barbiturates NONE DETECTED NONE DETECTED    Comment:        DRUG SCREEN FOR MEDICAL PURPOSES ONLY.  IF CONFIRMATION IS NEEDED FOR ANY PURPOSE, NOTIFY LAB WITHIN 5 DAYS.        LOWEST DETECTABLE LIMITS FOR URINE DRUG SCREEN Drug Class       Cutoff (ng/mL) Amphetamine      1000 Barbiturate      200 Benzodiazepine   774 Tricyclics       128 Opiates          300 Cocaine          300 THC              50    Ct Head Code Stroke W/o Cm  Result Date: 03/03/2017 CLINICAL DATA:  Code stroke. Initial evaluation for acute right-sided weakness. EXAM: CT HEAD WITHOUT CONTRAST TECHNIQUE: Contiguous axial images were obtained from the base of the skull through the vertex without intravenous contrast. COMPARISON:  Prior CT from 03/08/2015. FINDINGS: Brain: Generalized age-related cerebral atrophy. Chronic encephalomalacia within the anterior inferior right frontal lobe, unchanged. Small remote left occipital lobe infarct noted as well. No acute intracranial hemorrhage. No evidence for acute large vessel territory infarct. No mass lesion, midline shift or mass effect. No hydrocephalus. No extra-axial fluid collection. Vascular: No asymmetric hyperdense vessel. Scattered vascular calcifications noted within the carotid siphons. Skull: Scalp soft tissues demonstrate no acute abnormality. Calvarium intact. Sinuses/Orbits: Globes  and orbital soft tissues within normal limits. Paranasal sinuses are clear. No mastoid effusion. ASPECTS [place] Stroke Program Early CT Score) - Ganglionic level infarction (caudate, lentiform nuclei, internal capsule, insula, M1-M3 cortex): 7 - Supraganglionic infarction (M4-M6 cortex): 3 Total score (0-10 with 10 being normal): 10 IMPRESSION: 1. No acute intracranial infarct or other process identified. 2. ASPECTS is 10 3. Encephalomalacia within the anterior right frontal lobe, stable. 4. Small remote left occipital lobe infarct, also unchanged. Critical Value/emergent results were called by telephone at the time of interpretation on 03/03/2017 at 1:37 am to Dr. Veatrice Kells , who verbally acknowledged these results. Electronically Signed  By: Jeannine Boga M.D.   On: 03/03/2017 01:42    Assessment: 71 y.o. male with acute onset of right hemiparesis and speech deficit 1. Exam findings best localize as a left parietotemporal lesion, most likely an ischemic stroke. Possible underlying etiologies include cardioembolic (given history of LVT and subtherapeutic INR), artery to artery embolization and in situ thrombosis.  2. CT showed no acute changes. Chronic encephalomalacia involving the anterior right frontal lobe was noted. A small remote left occipital lobe infarction was also seen, per Radiology report; however, on my review of the images this latter finding is equivocal.  3. History of left ventricular thrombus, on warfarin. INR was subtherapeutic at OSH.  4. History of DES placement in 2014. On Plavix and ASA at home. Would contact his cardiologist to determine if he can be taken off Plavix given the time that has elapsed since DES placement.  5. Additional Stroke Risk Factors - DM, HTN, HLD, prior MI, evidence for prior ischemic stroke on CT  Plan: 1. MRI, MRA of the brain without contrast 2. MRA neck with contrast 3. TTE. If negative for mural thrombus, obtain TEE 4. PT consult, OT  consult, Speech consult 5. Pharmacy to dose warfarin.  6. Continue ASA.  7. Continue Plavix for now. Would contact his cardiologist to determine if he can be taken off Plavix given the time that has elapsed since DES placement.  8. Risk factor modification 9. Telemetry monitoring 10. Frequent neuro checks 11. Out of permissive HTN time window 12. HgbA1c, fasting lipid panel 13. Continue high-dose atorvastatin  '@Electronically'$  signed: Dr. Kerney Elbe  03/03/2017, 7:11 AM

## 2017-03-03 NOTE — Progress Notes (Signed)
Received into 5M12, oriented to room, safety precautions,tele applied & verified with CCMD.  Began stroke education.

## 2017-03-03 NOTE — ED Provider Notes (Signed)
[facility] Provider Note   CSN: [id number] Arrival date & time: 03/03/17  0047   By signing my name below, I, [name], attest that this documentation has been prepared under the direction and in the presence of [name], MD  Electronically Signed: [name], ED Scribe. 03/03/17. 12:58 AM.   History   Chief Complaint Chief Complaint  Patient presents with  . Weakness   LEVEL 5 CAVEAT DUE TO ACUITY OF CONDITION   HPI Comments:  Ralph Short is a 71 y.o. male who presents to the Emergency Department complaining of acute onset right sided weakness beginning between 8:25 PM-11PM yesterday. Pt's last known normal was 5 PM yesterday. No alleviating factors noted. Family denies a hx of stroke. No other complaints noted at this time.   The history is provided by the patient. No language interpreter was used.  Weakness  Primary symptoms include focal weakness, speech change. This is a new problem. The current episode started 6 to 12 hours ago (8 hours ago at 5 pm seen normal walking home). The problem has been gradually worsening. There was right upper extremity and right lower extremity (right sided) focality noted. There has been no fever. Pertinent negatives include no shortness of breath and no chest pain. There were no medications administered prior to arrival. Associated medical issues do not include trauma.    Past Medical History:  Diagnosis Date  . CAD S/P percutaneous coronary angioplasty - PCI LAD LAD (Xience DES) to prox LAD and mid LAD 07/05/13 07/05/2013  . CKD (chronic kidney disease) stage 3, GFR 30-59 ml/min 07/06/2013  . Diabetes mellitus without complication (HCC)   . Diverticulitis   . Diverticulitis   . DM (diabetes mellitus), type 2, uncontrolled, recently began insulin 07/05/2013  . Gout 07/05/2013  . History of agent Orange exposure 07/05/2013  . Hypertension   . LV dysfunction, s/p MI, 07/05/13 07/05/2013  . Presence of drug coated stent in LAD  coronary artery 07/05/2013   2 Xience Xpedition DES to prox & mid LAD -- in STEMI   . STEMI (ST elevation myocardial infarction)of ANT wall-total occ. of LAD  07/05/2013    Patient Active Problem List   Diagnosis Date Noted  . Intracranial hemorrhage following injury, anterior right frontal lobe 09/07/13 09/13/2013  . Long term (current) use of anticoagulants 07/15/2013  . Anticoagulated on Coumadin for LVT 07/08/2013  . Cardiomyopathy, ischemic - s/p Anterior STEMI, EF 25-30% 28-Jul-2013  . Acute systolic HF (heart failure) - s/p Anterior STEMI July 28, 2013  . 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 At risk for sudden cardiac death 2013-07-28  . CKD (chronic kidney disease) stage 3, GFR 30-59 ml/min 07/06/2013  . STEMI (ST elevation myocardial infarction)of ANT wall-total occ. of LAD  07/05/2013  . CAD S/P- PCI  (Xience DES) to prox LAD and mid LAD 07/05/13 07/05/2013  . Gout 07/05/2013  . DM (diabetes mellitus), type 2, uncontrolled, recently began insulin 07/05/2013  . HTN (hypertension) 07/05/2013  . History of agent Orange exposure 07/05/2013    Past Surgical History:  Procedure Laterality Date  . COLON SURGERY    . CORONARY ANGIOPLASTY WITH STENT PLACEMENT  07/05/13   emergency PCI and stenting in the setting of anterior STEMI a total proximal LAD with excellent angiographic result and a door to balloon time of 25 minutes.   Marland Kitchen KNEE SURGERY    . LEFT HEART CATHETERIZATION WITH CORONARY ANGIOGRAM N/A 07/05/2013   Procedure: LEFT HEART  CATHETERIZATION WITH CORONARY ANGIOGRAM;  Surgeon: Runell Gess, MD;  Location: [facility] CATH LAB;  Service: Cardiovascular;  Laterality: N/A;       Home Medications    Prior to Admission medications   Medication Sig Start Date End Date Taking? Authorizing Provider  allopurinol (ZYLOPRIM) 100 MG tablet Take 100 mg by mouth daily as needed (for gout).    Historical Provider, MD  aspirin 81 MG chewable tablet Chew 1 tablet (81  mg total) by mouth daily. 07/14/13   Brittainy Sherlynn Carbon, PA-C  atorvastatin (LIPITOR) 80 MG tablet Take 1 tablet (80 mg total) by mouth daily at 6 PM. 07/14/13   Brittainy M Sharol Harness, PA-C  carvedilol (COREG) 3.125 MG tablet Take 1 tablet (3.125 mg total) by mouth 2 (two) times daily with a meal. 07/14/13   Brittainy Sherlynn Carbon, PA-C  clopidogrel (PLAVIX) 75 MG tablet Take 1 tablet (75 mg total) by mouth daily. 09/13/13   Dwana Melena, PA-C  glipiZIDE (GLUCOTROL) 10 MG tablet Take 10 mg by mouth 2 (two) times daily before a meal.    Historical Provider, MD  HYDROcodone-acetaminophen (NORCO/VICODIN) 5-325 MG per tablet Take 1 tablet by mouth 2 (two) times daily as needed for pain.    Historical Provider, MD  insulin glargine (LANTUS) 100 UNIT/ML injection Inject 15 Units into the skin at bedtime.    Historical Provider, MD  losartan (COZAAR) 100 MG tablet Take 100 mg by mouth daily.    Historical Provider, MD  meclizine (ANTIVERT) 25 MG tablet Take 1 tablet (25 mg total) by mouth 3 (three) times daily as needed for dizziness. 03/08/15   Gilda Crease, MD  methocarbamol (ROBAXIN) 750 MG tablet Take 750 mg by mouth 2 (two) times daily as needed (for muscle spasms.).    Historical Provider, MD  nitroGLYCERIN (NITROSTAT) 0.4 MG SL tablet Place 1 tablet (0.4 mg total) under the tongue every 5 (five) minutes as needed for chest pain. 07/14/13   Brittainy Sherlynn Carbon, PA-C  potassium chloride SA (K-DUR,KLOR-CON) 20 MEQ tablet Take 1 tablet (20 mEq total) by mouth daily. 07/14/13   Brittainy Sherlynn Carbon, PA-C  warfarin (COUMADIN) 2.5 MG tablet Take 5 tablets (12.5 mg total) by mouth daily at 6 PM. 07/14/13   Brittainy Sherlynn Carbon, PA-C    Family History Family History  Problem Relation Age of Onset  . Alzheimer's disease Mother   . Hyperlipidemia Mother   . Hypertension Mother   . Diabetes type II Mother   . Hypertension Father   . Alzheimer's disease Father   . Diabetes type II Father   . Diabetes type II  Sister   . Hypertension Sister   . Heart disease Sister   . Heart disease Brother   . Hypertension Brother   . Diabetes type II Brother   . Diabetes type II Sister   . Hypertension Sister   . Diabetes type II Sister   . Hypertension Sister   . Heart disease Sister   . Diabetes type II Sister   . Hypertension Sister   . Diabetes type II Sister   . Hypertension Sister   . Diabetes type II Sister   . Hypertension Sister   . Hypertension Brother   . Diabetes type II Brother   . Hypertension Brother   . Diabetes type II Brother   . Spina bifida Son     Social History Social History  Substance Use Topics  . Smoking status: Former Engineer, civil (consulting)  date: 11/03/1992  . Smokeless tobacco: Never Used  . Alcohol use No     Allergies   Penicillins   Review of Systems Review of Systems  Unable to perform ROS: Acuity of condition  Constitutional: Negative for fever.  Respiratory: Negative for shortness of breath.   Cardiovascular: Negative for chest pain.  Neurological: Positive for speech change, focal weakness, facial asymmetry, speech difficulty and weakness.   Physical Exam Updated Vital Signs BP (!) 164/87 (BP Location: Right Arm)   Pulse 94   Temp 97.7 F (36.5 C) (Oral)   Resp 20   SpO2 97%   Physical Exam  Constitutional: He appears well-developed and well-nourished. No distress.  HENT:  Head: Normocephalic and atraumatic.  Mouth/Throat: Oropharynx is clear and moist. No oropharyngeal exudate.  Moist mucous membranes   Eyes: Conjunctivae are normal. Pupils are equal, round, and reactive to light.  Neck: Normal range of motion. No JVD present.  Trachea midline No bruit  Cardiovascular: Normal rate, regular rhythm, normal heart sounds and intact distal pulses.   Pulmonary/Chest: Effort normal and breath sounds normal. No stridor. No respiratory distress.  Abdominal: Soft. Bowel sounds are normal. He exhibits no distension. There is no rebound and no guarding.    Musculoskeletal: Normal range of motion. He exhibits no edema.  Moving all extremities  Neurological: He is alert. He has normal reflexes. He displays normal reflexes. A cranial nerve deficit is present. GCS eye subscore is 4. GCS verbal subscore is 4.  Facial droop on left. 5-/5 on right word salad  Skin: Skin is warm and dry.  Psychiatric: He has a normal mood and affect. His behavior is normal.  Nursing note and vitals reviewed.    ED Treatments / Results   Vitals:   03/03/17 0056  BP: (!) 164/87  Pulse: 94  Resp: 20  Temp: 97.7 F (36.5 C)    DIAGNOSTIC STUDIES: Oxygen Saturation is 98% on RA, normal by my interpretation.     Labs (all labs ordered are listed, but only abnormal results are displayed) Results for orders placed or performed during the hospital encounter of 03/03/17  Ethanol  Result Value Ref Range   Alcohol, Ethyl (B) <5 <5 mg/dL  Protime-INR  Result Value Ref Range   Prothrombin Time 12.9 11.4 - 15.2 seconds   INR 0.97   APTT  Result Value Ref Range   aPTT 32 24 - 36 seconds  CBC  Result Value Ref Range   WBC 5.4 4.0 - 10.5 K/uL   RBC 4.36 4.22 - 5.81 MIL/uL   Hemoglobin 13.8 13.0 - 17.0 g/dL   HCT 16.1 09.6 - 04.5 %   MCV 95.9 78.0 - 100.0 fL   MCH 31.7 26.0 - 34.0 pg   MCHC 33.0 30.0 - 36.0 g/dL   RDW 40.9 81.1 - 91.4 %   Platelets 167 150 - 400 K/uL  Differential  Result Value Ref Range   Neutrophils Relative % 47 %   Neutro Abs 2.5 1.7 - 7.7 K/uL   Lymphocytes Relative 41 %   Lymphs Abs 2.2 0.7 - 4.0 K/uL   Monocytes Relative 8 %   Monocytes Absolute 0.4 0.1 - 1.0 K/uL   Eosinophils Relative 3 %   Eosinophils Absolute 0.2 0.0 - 0.7 K/uL   Basophils Relative 1 %   Basophils Absolute 0.0 0.0 - 0.1 K/uL  Comprehensive metabolic panel  Result Value Ref Range   Sodium 140 135 - 145 mmol/L   Potassium 4.2 3.5 -  5.1 mmol/L   Chloride 110 101 - 111 mmol/L   CO2 22 22 - 32 mmol/L   Glucose, Bld 286 (H) 65 - 99 mg/dL   BUN 13 6 - 20  mg/dL   Creatinine, Ser 6.77 (H) 0.61 - 1.24 mg/dL   Calcium 8.9 8.9 - 03.4 mg/dL   Total Protein 7.0 6.5 - 8.1 g/dL   Albumin 3.8 3.5 - 5.0 g/dL   AST 26 15 - 41 U/L   ALT 22 17 - 63 U/L   Alkaline Phosphatase 132 (H) 38 - 126 U/L   Total Bilirubin 0.5 0.3 - 1.2 mg/dL   GFR calc non Af Amer 47 (L) >60 mL/min   GFR calc Af Amer 54 (L) >60 mL/min   Anion gap 8 5 - 15  CBG monitoring, ED  Result Value Ref Range   Glucose-Capillary 252 (H) 65 - 99 mg/dL   Ct Head Code Stroke W/o Cm  Result Date: 03/03/2017 CLINICAL DATA:  Code stroke. Initial evaluation for acute right-sided weakness. EXAM: CT HEAD WITHOUT CONTRAST TECHNIQUE: Contiguous axial images were obtained from the base of the skull through the vertex without intravenous contrast. COMPARISON:  Prior CT from 03/08/2015. FINDINGS: Brain: Generalized age-related cerebral atrophy. Chronic encephalomalacia within the anterior inferior right frontal lobe, unchanged. Small remote left occipital lobe infarct noted as well. No acute intracranial hemorrhage. No evidence for acute large vessel territory infarct. No mass lesion, midline shift or mass effect. No hydrocephalus. No extra-axial fluid collection. Vascular: No asymmetric hyperdense vessel. Scattered vascular calcifications noted within the carotid siphons. Skull: Scalp soft tissues demonstrate no acute abnormality. Calvarium intact. Sinuses/Orbits: Globes and orbital soft tissues within normal limits. Paranasal sinuses are clear. No mastoid effusion. ASPECTS [facility] Stroke Program Early CT Score) - Ganglionic level infarction (caudate, lentiform nuclei, internal capsule, insula, M1-M3 cortex): 7 - Supraganglionic infarction (M4-M6 cortex): 3 Total score (0-10 with 10 being normal): 10 IMPRESSION: 1. No acute intracranial infarct or other process identified. 2. ASPECTS is 10 3. Encephalomalacia within the anterior right frontal lobe, stable. 4. Small remote left occipital lobe infarct, also  unchanged. Critical Value/emergent results were called by telephone at the time of interpretation on 03/03/2017 at 1:37 am to Dr. Cy Blamer , who verbally acknowledged these results. Electronically Signed   By: Rise Mu M.D.   On: 03/03/2017 01:42     EKG Interpretation  Date/Time:  Tuesday Mar 03 2017 01:21:28 EDT Ventricular Rate:  88 PR Interval:    QRS Duration: 95 QT Interval:  338 QTC Calculation: 409 R Axis:   2 Text Interpretation:  Sinus rhythm LVH with secondary repolarization abnormality Anterior infarct, old Confirmed by [facility]  MD, Manila Rommel ([id number]) on 03/03/2017 1:28:26 AM       Procedures Procedures (including critical care time)  201 case d/w Dr. Otelia Limes of neurology no indication for TPA, admit to medicine at [facility]  NIH scale 8  Final Clinical Impressions(s) / ED Diagnoses  Stroke like symptoms: admit to medicine service at [facility]    I personally performed the services described in this documentation, which was scribed in my presence. The recorded information has been reviewed and is accurate.      Cy Blamer, MD 03/03/17 [id number]

## 2017-03-03 NOTE — Evaluation (Signed)
Physical Therapy Evaluation Patient Details Name: Ralph Short MRN: [id number] DOB: 1945/12/01 Today's Date: 03/03/2017   History of Present Illness  71 y.o. male with medical history significant of CAD, with STEMI treated with DES,  LV thrombus development post MI - on coumadin therapy, systolic CHF with EF 25-30%, traumatic intracranial hemorrhage, DM type type II, hyperlipidemia, CKD who presented to the [facility]  with c/o right sided weakness and speech deficit.   Clinical Impression  Patient presents with decreased independence with mobility due to decreased balance, decreased R UE coordination and mostly some speech difficulty.  He will benefit from skilled PT in the acute setting to allow return home with family support and follow up outpatient PT.    Follow Up Recommendations Outpatient PT;Supervision - Intermittent    Equipment Recommendations  None recommended by PT    Recommendations for Other Services       Precautions / Restrictions Precautions Precautions: Fall      Mobility  Bed Mobility Overal bed mobility: Needs Assistance Bed Mobility: Supine to Sit     Supine to sit: Min guard     General bed mobility comments: use of rail and assist for trunk  Transfers Overall transfer level: Needs assistance Equipment used: Rolling walker (2 wheeled) Transfers: Sit to/from Stand Sit to Stand: Min guard         General transfer comment: for balance/safety  Ambulation/Gait Ambulation/Gait assistance: Min guard Ambulation Distance (Feet): 300 Feet Assistive device: Rolling walker (2 wheeled) Gait Pattern/deviations: Step-through pattern;Decreased stride length;Wide base of support     General Gait Details: reports imbalance since episode  Stairs            Wheelchair Mobility    Modified Rankin (Stroke Patients Only) Modified Rankin (Stroke Patients Only) Pre-Morbid Rankin Score: No symptoms Modified Rankin: Moderately severe  disability     Balance Overall balance assessment: Needs assistance   Sitting balance-Leahy Scale: Good       Standing balance-Leahy Scale: Fair Standing balance comment: static standing without UE support, but for ambulation wanted to use walker                             Pertinent Vitals/Pain Pain Assessment: No/denies pain    Home Living Family/patient expects to be discharged to:: Private residence Living Arrangements: Children Available Help at Discharge: Family Type of Home: House Home Access: Ramped entrance     Home Layout: Multi-level;Able to live on main level with bedroom/bathroom Home Equipment: Dan Humphreys - 2 wheels;Shower seat;Wheelchair - manual Additional Comments: son with Spina Bifida and owns some of the equipment, but walker is pt's    Prior Function                 Hand Dominance   Dominant Hand: Right    Extremity/Trunk Assessment   Upper Extremity Assessment Upper Extremity Assessment: RUE deficits/detail RUE Deficits / Details: decreased coordination with pron/sup and fingers to thumb    Lower Extremity Assessment Lower Extremity Assessment: Overall WFL for tasks assessed       Communication   Communication: Expressive difficulties  Cognition Arousal/Alertness: Awake/alert Behavior During Therapy: WFL for tasks assessed/performed Overall Cognitive Status: Within Functional Limits for tasks assessed  General Comments General comments (skin integrity, edema, etc.): 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  Sons travel too sometimes.    Exercises     Assessment/Plan    PT Assessment Patient needs continued PT services  PT Problem List Decreased mobility;Decreased coordination;Decreased balance;Decreased knowledge of use of DME       PT Treatment  Interventions DME instruction;Gait training;Therapeutic exercise;Patient/family education;Balance training;Functional mobility training;Therapeutic activities    PT Goals (Current goals can be found in the Care Plan section)  Acute Rehab PT Goals Patient Stated Goal: To get back to being active PT Goal Formulation: With patient Time For Goal Achievement: 03/10/17 Potential to Achieve Goals: Good    Frequency Min 4X/week   Barriers to discharge        Co-evaluation               AM-PAC PT "6 Clicks" Daily Activity  Outcome Measure Difficulty turning over in bed (including adjusting bedclothes, sheets and blankets)?: A Little Difficulty moving from lying on back to sitting on the side of the bed? : Total Difficulty sitting down on and standing up from a chair with arms (e.g., wheelchair, bedside commode, etc,.)?: Total Help needed moving to and from a bed to chair (including a wheelchair)?: A Little Help needed walking in hospital room?: A Little Help needed climbing 3-5 steps with a railing? : A Little 6 Click Score: 14    End of Session Equipment Utilized During Treatment: Gait belt Activity Tolerance: Patient tolerated treatment well Patient left: in bed;with call bell/phone within reach;with bed alarm set;with nursing/sitter in room   PT Visit Diagnosis: Other abnormalities of gait and mobility (R26.89);Other symptoms and signs involving the nervous system (R29.898)    Time: 1610-9604 PT Time Calculation (min) (ACUTE ONLY): 30 min   Charges:   PT Evaluation $PT Eval Moderate Complexity: 1 Procedure PT Treatments $Gait Training: 8-22 mins   PT G CodesSheran Lawless, [place] [phone] 03/03/2017   Elray Mcgregor 03/03/2017, 3:46 PM

## 2017-03-03 NOTE — Progress Notes (Signed)
ANTICOAGULATION CONSULT NOTE - Follow Up Consult  Pharmacy Consult for Heparin Indication: LV thrombus, recent CVA  Allergies  Allergen Reactions  . Penicillins Hives  . Aspirin Palpitations    Patient Measurements: Height: 5\' 10"  (177.8 cm) Weight: 254 lb 3.2 oz (115.3 kg) IBW/kg (Calculated) : 73 Heparin Dosing Weight: 98.5 kg  Vital Signs: Temp: 97.9 F (36.6 C) (05/01 2134) Temp Source: Oral (05/01 2134) BP: 142/85 (05/01 2134) Pulse Rate: 74 (05/01 2134)  Labs:  Recent Labs  03/03/17 0058 03/03/17 2139  HGB 13.8  --   HCT 41.8  --   PLT 167  --   APTT 32  --   LABPROT 12.9  --   INR 0.97  --   HEPARINUNFRC  --  0.99*  CREATININE 1.46*  --   TROPONINI <0.03  --     Estimated Creatinine Clearance: 59.9 mL/min (A) (by C-G formula based on SCr of 1.46 mg/dL (H)).   Medications:  Scheduled:  . atorvastatin  80 mg Oral q1800  . carvedilol  3.125 mg Oral BID WC  . insulin aspart  0-9 Units Subcutaneous TID WC  . insulin glargine  15 Units Subcutaneous QHS  . potassium chloride SA  20 mEq Oral Daily  . warfarin  10 mg Oral ONCE-1800  . Warfarin - Pharmacist Dosing Inpatient   Does not apply q1800   Infusions:  . heparin 1,500 Units/hr (03/03/17 1534)    Assessment: 70 yom on warfarin PTA for LV thrombus admitted with new ischemic stroke. Pharmacy consulted to dose heparin bridge (lower goal, no bolus in setting of stroke) + warfarin. Noted history of ICH in the past. INR subtherapeutic 0.97 on admit (pt states he is taking every day PTA? - [facility] and son to bring in medications from home). CBC wnl. No bleed documented.  Initial heparin level is supra therapeutic on 1500 units/hr.  Spoke with RN, no bleeding noted.  Goal of Therapy:  Heparin level 0.3-0.5 units/ml Monitor platelets by anticoagulation protocol: Yes   Plan:  Hold heparin x 1 hour (2300-2400) Restart heparin at 1250 units/hr. Next heparin level 0800 on  5/2.   [employer], Pharm.D., BCPS Clinical Pharmacist Pager: [phone] Clinical phone for 03/03/2017 from 3:30-11:00 is [phone]. After 11pm, please call Main Rx [phone]) for assistance. 03/03/2017 10:47 PM

## 2017-03-03 NOTE — ED Notes (Addendum)
Attempted report to [facility] 5M05 

## 2017-03-04 ENCOUNTER — Inpatient Hospital Stay (HOSPITAL_COMMUNITY): Payer: Non-veteran care

## 2017-03-04 DIAGNOSIS — I6789 Other cerebrovascular disease: Secondary | ICD-10-CM

## 2017-03-04 DIAGNOSIS — I10 Essential (primary) hypertension: Secondary | ICD-10-CM

## 2017-03-04 LAB — LIPID PANEL
CHOL/HDL RATIO: 3.6 ratio
CHOLESTEROL: 98 mg/dL (ref 0–200)
HDL: 27 mg/dL — AB (ref >40)
LDL Cholesterol: 56 mg/dL (ref 0–99)
Triglycerides: 73 mg/dL (ref <150)
VLDL: 15 mg/dL (ref 0–40)

## 2017-03-04 LAB — ECHOCARDIOGRAM COMPLETE
HEIGHTINCHES: 70 in
Weight: 4067.2 oz

## 2017-03-04 LAB — PROTIME-INR
INR: 1.07
Prothrombin Time: 14 seconds (ref 11.4–15.2)

## 2017-03-04 LAB — CBC
HCT: 41.4 % (ref 39.0–52.0)
Hemoglobin: 13.4 g/dL (ref 13.0–17.0)
MCH: 30.6 pg (ref 26.0–34.0)
MCHC: 32.4 g/dL (ref 30.0–36.0)
MCV: 94.5 fL (ref 78.0–100.0)
Platelets: 171 10*3/uL (ref 150–400)
RBC: 4.38 MIL/uL (ref 4.22–5.81)
RDW: 14.3 % (ref 11.5–15.5)
WBC: 5.7 10*3/uL (ref 4.0–10.5)

## 2017-03-04 LAB — HEPARIN LEVEL (UNFRACTIONATED)
HEPARIN UNFRACTIONATED: 0.41 [IU]/mL (ref 0.30–0.70)
Heparin Unfractionated: 0.58 IU/mL (ref 0.30–0.70)
Heparin Unfractionated: 0.48 IU/mL (ref 0.30–0.70)

## 2017-03-04 LAB — GLUCOSE, CAPILLARY
GLUCOSE-CAPILLARY: 102 mg/dL — AB (ref 65–99)
GLUCOSE-CAPILLARY: 217 mg/dL — AB (ref 65–99)
GLUCOSE-CAPILLARY: 204 mg/dL — AB (ref 65–99)
Glucose-Capillary: 196 mg/dL — ABNORMAL HIGH (ref 65–99)

## 2017-03-04 MED ORDER — GADOBENATE DIMEGLUMINE 529 MG/ML IV SOLN
20.0000 mL | Freq: Once | INTRAVENOUS | Status: AC | PRN
  Administered 2017-03-04: 20 mL via INTRAVENOUS

## 2017-03-04 MED ORDER — IBUPROFEN 200 MG PO TABS: 400.0000 mg | ORAL_TABLET | Freq: Once | ORAL | Status: DC

## 2017-03-04 MED ORDER — PERFLUTREN LIPID MICROSPHERE
1.0000 mL | INTRAVENOUS | Status: AC | PRN
  Administered 2017-03-04: 3 mL via INTRAVENOUS
  Filled 2017-03-04: qty 10

## 2017-03-04 MED ORDER — WARFARIN SODIUM 5 MG PO TABS: 10.0000 mg | ORAL_TABLET | Freq: Once | ORAL | Status: DC

## 2017-03-04 MED ORDER — LORAZEPAM 2 MG/ML IJ SOLN
1.0000 mg | Freq: Once | INTRAMUSCULAR | Status: AC
  Administered 2017-03-04: 1 mg via INTRAVENOUS
  Filled 2017-03-04: qty 1

## 2017-03-04 NOTE — Progress Notes (Addendum)
[facility] PROGRESS NOTE  Ralph Short UJW:[id number] DOB: 1946-05-27 DOA: 03/03/2017  PCP: No PCP Per Patient  Brief History/Interval Summary: 71 year old male with a past medical history of coronary artery disease, treated previously with a drug-eluting stent to unknown vessels, history of LV thrombus, who was on warfarin treatment, history of systolic CHF with ejection fraction of 25-30%, history of traumatic intracranial hemorrhage, history of diabetes mellitus type 2, hyperlipidemia and chronic disease, presented with the right-sided weakness and speech impairment. Patient was hospitalized. He was seen by neurology. MRI revealed stroke.  Reason for Visit: Acute stroke  Consultants: Neurology  Procedures:   Transthoracic echocardiogram is pending  Antibiotics: None  Subjective/Interval History: Patient feels better this morning. States that his weakness in the right side has significantly improved. Speech is also improved although he still has moments when he has difficulty finding words.  ROS: Denies any nausea, vomiting, chest pain or shortness of breath.  Objective:  Vital Signs  Vitals:   03/04/17 0100 03/04/17 0603 03/04/17 0718 03/04/17 0935  BP: 138/80 105/62 122/68 103/65  Pulse: 72 89 76 81  Resp: 18 20 17 20   Temp: 98 F (36.7 C) 98.7 F (37.1 C) 98.4 F (36.9 C) 98.2 F (36.8 C)  TempSrc: Oral Oral Oral Oral  SpO2: 100% 95% 96% 98%  Weight:      Height:        Intake/Output Summary (Last 24 hours) at 03/04/17 1102 Last data filed at 03/04/17 1041  Gross per 24 hour  Intake           719.17 ml  Output             1000 ml  Net          -280.83 ml   Filed Weights   03/03/17 0451  Weight: 115.3 kg (254 lb 3.2 oz)    General appearance: alert, cooperative, appears stated age and no distress Resp: clear to auscultation bilaterally Cardio: regular rate and rhythm, S1, S2 normal, no murmur, click, rub or gallop GI: soft, non-tender; bowel  sounds normal; no masses,  no organomegaly Extremities: extremities normal, atraumatic, no cyanosis or edema Neurologic: Awake and alert. Oriented 3. Some degree of dysarthria is noted. No facial asymmetry. No pronator drift. Finger-to-nose is slow right. Dysdiadochokinesis is present on the right. Gait not assessed.  Lab Results:  Data Reviewed: I have personally reviewed following labs and imaging studies  CBC:  Recent Labs Lab 03/03/17 0058 03/04/17 0638  WBC 5.4 5.7  NEUTROABS 2.5  --   HGB 13.8 13.4  HCT 41.8 41.4  MCV 95.9 94.5  PLT 167 171    Basic Metabolic Panel:  Recent Labs Lab 03/03/17 0058  NA 140  K 4.2  CL 110  CO2 22  GLUCOSE 286*  BUN 13  CREATININE 1.46*  CALCIUM 8.9    GFR: Estimated Creatinine Clearance: 59.9 mL/min (A) (by C-G formula based on SCr of 1.46 mg/dL (H)).  Liver Function Tests:  Recent Labs Lab 03/03/17 0058  AST 26  ALT 22  ALKPHOS 132*  BILITOT 0.5  PROT 7.0  ALBUMIN 3.8    Coagulation Profile:  Recent Labs Lab 03/03/17 0058 03/04/17 0638  INR 0.97 1.07    Cardiac Enzymes:  Recent Labs Lab 03/03/17 0058  TROPONINI <0.03    CBG:  Recent Labs Lab 03/03/17 0448 03/03/17 1234 03/03/17 1617 03/03/17 2153 03/04/17 0559  GLUCAP 214* 165* 130* 114* 102*    Lipid Profile:  Recent Labs  03/04/17 0638  CHOL 98  HDL 27*  LDLCALC 56  TRIG 73  CHOLHDL 3.6     Radiology Studies: Mr Brain Wo Contrast  Result Date: 03/03/2017 CLINICAL DATA:  Stroke.  Dysphasia. EXAM: MRI HEAD WITHOUT CONTRAST MRA HEAD WITHOUT CONTRAST TECHNIQUE: Multiplanar, multiecho pulse sequences of the brain and surrounding structures were obtained without intravenous contrast. Angiographic images of the head were obtained using MRA technique without contrast. COMPARISON:  Head CT from earlier today FINDINGS: MRI HEAD FINDINGS Incomplete study, T1 and coronal T2 weighted imaging was not acquired. Brain: Moderate confluent area of  acute cortically based infarction in the posterior left frontal lobe, reaching the anterior parietal cortex. No hemorrhage, hydrocephalus, or mass. Gliosis in the right frontal lobe, likely post ischemic. Microvascular ischemic change in the cerebral white matter is minimal. Vascular: Arterial findings below. Normal dural venous sinus flow voids. Skull and upper cervical spine: Negative Sinuses/Orbits: Negative MRA HEAD FINDINGS Symmetric carotid and vertebral arteries. No unusual branching pattern. No major branch occlusion, proximal stenosis, or vessel beading. Negative for aneurysm. Neck MRA was requested but could not be completed due to patient intolerance of the scanner. IMPRESSION: 1. Moderate acute infarct in the posterior left frontal lobe, nonhemorrhagic. 2. Negative intracranial MRA. 3. Moderate gliosis in the right frontal pole, likely post ischemic. 4. Incomplete study, including deferred neck MRA. Electronically Signed   By: Marnee Spring M.D.   On: 03/03/2017 20:36   Mr Maxine Glenn Headm  Result Date: 03/03/2017 CLINICAL DATA:  Stroke.  Dysphasia. EXAM: MRI HEAD WITHOUT CONTRAST MRA HEAD WITHOUT CONTRAST TECHNIQUE: Multiplanar, multiecho pulse sequences of the brain and surrounding structures were obtained without intravenous contrast. Angiographic images of the head were obtained using MRA technique without contrast. COMPARISON:  Head CT from earlier today FINDINGS: MRI HEAD FINDINGS Incomplete study, T1 and coronal T2 weighted imaging was not acquired. Brain: Moderate confluent area of acute cortically based infarction in the posterior left frontal lobe, reaching the anterior parietal cortex. No hemorrhage, hydrocephalus, or mass. Gliosis in the right frontal lobe, likely post ischemic. Microvascular ischemic change in the cerebral white matter is minimal. Vascular: Arterial findings below. Normal dural venous sinus flow voids. Skull and upper cervical spine: Negative Sinuses/Orbits: Negative MRA HEAD  FINDINGS Symmetric carotid and vertebral arteries. No unusual branching pattern. No major branch occlusion, proximal stenosis, or vessel beading. Negative for aneurysm. Neck MRA was requested but could not be completed due to patient intolerance of the scanner. IMPRESSION: 1. Moderate acute infarct in the posterior left frontal lobe, nonhemorrhagic. 2. Negative intracranial MRA. 3. Moderate gliosis in the right frontal pole, likely post ischemic. 4. Incomplete study, including deferred neck MRA. Electronically Signed   By: Marnee Spring M.D.   On: 03/03/2017 20:36   Ct Head Code Stroke W/o Cm  Result Date: 03/03/2017 CLINICAL DATA:  Code stroke. Initial evaluation for acute right-sided weakness. EXAM: CT HEAD WITHOUT CONTRAST TECHNIQUE: Contiguous axial images were obtained from the base of the skull through the vertex without intravenous contrast. COMPARISON:  Prior CT from 03/08/2015. FINDINGS: Brain: Generalized age-related cerebral atrophy. Chronic encephalomalacia within the anterior inferior right frontal lobe, unchanged. Small remote left occipital lobe infarct noted as well. No acute intracranial hemorrhage. No evidence for acute large vessel territory infarct. No mass lesion, midline shift or mass effect. No hydrocephalus. No extra-axial fluid collection. Vascular: No asymmetric hyperdense vessel. Scattered vascular calcifications noted within the carotid siphons. Skull: Scalp soft tissues demonstrate no acute abnormality. Calvarium  intact. Sinuses/Orbits: Globes and orbital soft tissues within normal limits. Paranasal sinuses are clear. No mastoid effusion. ASPECTS [facility] Stroke Program Early CT Score) - Ganglionic level infarction (caudate, lentiform nuclei, internal capsule, insula, M1-M3 cortex): 7 - Supraganglionic infarction (M4-M6 cortex): 3 Total score (0-10 with 10 being normal): 10 IMPRESSION: 1. No acute intracranial infarct or other process identified. 2. ASPECTS is 10 3. Encephalomalacia  within the anterior right frontal lobe, stable. 4. Small remote left occipital lobe infarct, also unchanged. Critical Value/emergent results were called by telephone at the time of interpretation on 03/03/2017 at 1:37 am to Dr. Cy Blamer , who verbally acknowledged these results. Electronically Signed   By: Rise Mu M.D.   On: 03/03/2017 01:42     Medications:  Scheduled: . atorvastatin  80 mg Oral q1800  . carvedilol  3.125 mg Oral BID WC  . insulin aspart  0-9 Units Subcutaneous TID WC  . insulin glargine  15 Units Subcutaneous QHS  . potassium chloride SA  20 mEq Oral Daily  . warfarin  10 mg Oral ONCE-1800  . Warfarin - Pharmacist Dosing Inpatient   Does not apply q1800   Continuous: . 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 Stroke workup is pending including echocardiogram, MRA neck. LDL is 56. Stroke team is following. Patient is currently on heparin and warfarin. However, patient tells me that he may have been taken off of his warfarin by his primary and other providers at the [place]. Will call his facility to confirm this.   History of LV thrombus Patient has previous history of same. Patient is not very certain about his warfarin use, although he tells me that he was taken off of it by his providers at the [place]. He is just on aspirin and Plavix. Await echocardiogram.  Continue heparin for now.   History of CAD with DES placement in 2014 Seems to be stable from a cardiac perspective. Continue home medications.   Essential hypertension  Blood pressure is stable. Continue to monitor.   History of Hyperlipidemia LDL is 56. Continue statin.   Diabetes mellitus type 2  HbA1c is pending. Continue home dose of Lantus and sliding scale insulin coverage. Monitor CBGs.   History of Gout Stable. Continue allopurinol.   DVT Prophylaxis: Currently on IV heparin    Code Status: Full code  Family Communication: Discussed with the patient. No family at bedside  Disposition Plan: Management as outlined above. Looks like he will need only outpatient physical therapy per PT evaluation.   LOS: 1 day   [facility]  [facility] Pager [phone] 03/04/2017, 11:02 AM  If 7PM-7AM, please contact night-coverage at [email or web address], password [facility]

## 2017-03-04 NOTE — Progress Notes (Signed)
ANTICOAGULATION CONSULT NOTE - Follow Up Consult  Pharmacy Consult for heparin Indication: LV thrombus, recent CVA  Allergies  Allergen Reactions  . Penicillins Hives  . Aspirin Palpitations    Patient Measurements: Height: 5\' 10"  (177.8 cm) Weight: 254 lb 3.2 oz (115.3 kg) IBW/kg (Calculated) : 73 Heparin Dosing Weight: 98.5 kg  Labs:  Recent Labs  03/03/17 0058  03/04/17 0638 03/04/17 1602 03/04/17 2200  HGB 13.8  --  13.4  --   --   HCT 41.8  --  41.4  --   --   PLT 167  --  171  --   --   APTT 32  --   --   --   --   LABPROT 12.9  --  14.0  --   --   INR 0.97  --  1.07  --   --   HEPARINUNFRC  --   < > 0.58 0.48 0.41  CREATININE 1.46*  --   --   --   --   TROPONINI <0.03  --   --   --   --   < > = values in this interval not displayed.  Estimated Creatinine Clearance: 59.9 mL/min (A) (by C-G formula based on SCr of 1.46 mg/dL (H)).   Medications:  Infusions:  . heparin 1,150 Units/hr (03/04/17 1041)    Assessment: 70 yom on warfarin PTA (appears to have been d/c'd at some point) for LV thrombus admitted with ischemic stroke. Pharmacy consulted to dose heparin bridge (lower goal, no bolus in setting of stroke) + warfarin.  Heparin level this evening at goal.    Goal of Therapy:  Heparin level 0.3-0.5 units/ml Monitor platelets by anticoagulation protocol: Yes   Plan:  1. Continue IV heparin at current rate. 2. Daily heparin level and CBC.  Tad Moore, BCPS  Clinical Pharmacist Pager [phone]  03/04/2017 10:40 PM

## 2017-03-04 NOTE — Progress Notes (Signed)
  Echocardiogram 2D Echocardiogram has been performed.  Arvil Chaco 03/04/2017, 2:45 PM

## 2017-03-04 NOTE — Progress Notes (Signed)
Occupational Therapy Treatment Patient Details Name: Ralph Short MRN: [id number] DOB: 06/30/1946 Today's Date: 03/04/2017    History of present illness 71 y.o. male with medical history significant of CAD, with STEMI treated with DES,  LV thrombus development post MI - on coumadin therapy, systolic CHF with EF 25-30%, traumatic intracranial hemorrhage, DM type type II, hyperlipidemia, CKD who presented to the [facility]  with c/o right sided weakness and speech deficit.    OT comments  Pt admitted with above. He demonstrates the below listed deficits and will benefit from continued OT to maximize safety and independence with BADLs.  Pt demonstrates questionable high level cognitive deficits as well as impaired visual saccades during testing.   Will follow acutely, and recommend OPOT as pt was very independent PTA and travels frequently.     Follow Up Recommendations  Outpatient OT;Supervision - Intermittent    Equipment Recommendations  None recommended by OT    Recommendations for Other Services      Precautions / Restrictions Precautions Precautions: Fall       Mobility Bed Mobility               General bed mobility comments: pt sitting up in chair   Transfers Overall transfer level: Needs assistance Equipment used: None Transfers: Sit to/from Stand;Stand Pivot Transfers Sit to Stand: Supervision Stand pivot transfers: Supervision            Balance Overall balance assessment: Needs assistance   Sitting balance-Leahy Scale: Good       Standing balance-Leahy Scale: Good Standing balance comment: pt was able to remove clothing bags from closet, place them on bed and sort through clothing while in standing and without LOB                            ADL either performed or assessed with clinical judgement   ADL Overall ADL's : Needs assistance/impaired Eating/Feeding: Independent   Grooming: Wash/dry hands;Wash/dry face;Oral  care;Supervision/safety;Standing   Upper Body Bathing: Set up;Sitting   Lower Body Bathing: Supervison/ safety;Sit to/from stand   Upper Body Dressing : Set up;Sitting   Lower Body Dressing: Supervision/safety;Sit to/from stand   Toilet Transfer: Supervision/safety;Ambulation;Comfort height toilet;Grab bars   Toileting- Clothing Manipulation and Hygiene: Supervision/safety;Sit to/from stand       Functional mobility during ADLs: Supervision/safety       Vision Baseline Vision/History: Wears glasses Wears Glasses:  (supposed to wear glasses at all times ) Patient Visual Report: Blurring of vision Vision Assessment?: Yes Eye Alignment: Within Functional Limits Ocular Range of Motion: Within Functional Limits Alignment/Gaze Preference: Within Defined Limits Tracking/Visual Pursuits: Able to track stimulus in all quads without difficulty Saccades: Decreased speed of saccadic movement Additional Comments: Pt with occasional inconsistencies during field testing that appeared due to self distraction and impaired attention.   Pt with impaired saccades - was able to perform static saccades, but not moving saccades - he feels this is due to not having his glasses    Perception Perception Perception Tested?: Yes   Praxis Praxis Praxis tested?: Within functional limits    Cognition Arousal/Alertness: Awake/alert Behavior During Therapy: WFL for tasks assessed/performed Overall Cognitive Status: No family/caregiver present to determine baseline cognitive functioning                                 General Comments: Pt very talkative, and  changes topics quickly making it difficult to follow train of thought and thought process.  Pt has a big personality, an unsure if this is pt baseline, or if there is an attentional and behavioral component due to stroke.  Pt feels he is at baseline cognitively, and that the his language deficits will get better as "God was putting me in  my place, and making me listen"          Exercises     Shoulder Instructions       General Comments      Pertinent Vitals/ Pain       Pain Assessment: No/denies pain  Home Living Family/patient expects to be discharged to:: Private residence Living Arrangements: Children Available Help at Discharge: Family Type of Home: House Home Access: Ramped entrance     Home Layout: Multi-level;Able to live on main level with bedroom/bathroom     Bathroom Shower/Tub: Walk-in shower   Bathroom Toilet: Handicapped height     Home Equipment: Environmental consultant - 2 wheels;Shower seat;Wheelchair - manual   Additional Comments: Pt lives with 3 sons  Lives With: Son    Prior Functioning/Environment Level of Independence: Independent        Comments: Pt reports he travels weekly and 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    Frequency  Min 2X/week        Progress Toward Goals  OT Goals(current goals can now be found in the care plan section)     Acute Rehab OT Goals Patient Stated Goal: to get back to normal  OT Goal Formulation: With patient Potential to Achieve Goals: Good ADL Goals Pt Will Perform Grooming: with modified independence;standing Pt Will Perform Upper Body Bathing: with modified independence;standing Pt Will Perform Lower Body Bathing: with modified independence;sit to/from stand Pt Will Perform Upper Body Dressing: with modified independence;sitting Pt Will Perform Lower Body Dressing: with modified independence;sit to/from stand Pt Will Transfer to Toilet: with modified independence;ambulating;regular height toilet;grab bars Pt Will Perform Toileting - Clothing Manipulation and hygiene: with modified independence;sit to/from stand Additional ADL Goal #1: Pt will be able to alternate attention independently   Plan      Co-evaluation                 AM-PAC PT "6 Clicks" Daily Activity     Outcome Measure   Help from another person eating  meals?: None Help from another person taking care of personal grooming?: A Little Help from another person toileting, which includes using toliet, bedpan, or urinal?: A Little Help from another person bathing (including washing, rinsing, drying)?: A Little Help from another person to put on and taking off regular upper body clothing?: A Little Help from another person to put on and taking off regular lower body clothing?: A Little 6 Click Score: 19    End of Session Equipment Utilized During Treatment: Gait belt  OT Visit Diagnosis: Unsteadiness on feet (R26.81);Cognitive communication deficit (R41.841) Symptoms and signs involving cognitive functions: Cerebral infarction   Activity Tolerance Patient tolerated treatment well   Patient Left in chair;with call bell/phone within reach   Nurse Communication Mobility status        Time: 1132-1220 OT Time Calculation (min): 48 min  Charges: OT General Charges $OT Visit: 1 Procedure OT Evaluation $OT Eval Moderate Complexity: 1 Procedure OT Treatments $Therapeutic Activity: 23-37 mins  [employer], OTR/L [phone]    Jeani Hawking M 03/04/2017, 2:17 PM

## 2017-03-04 NOTE — Progress Notes (Signed)
SLP Cancellation Note  Patient Details Name: Ralph Short MRN: [id number] DOB: Jan 29, 1946   Cancelled treatment:       Reason Eval/Treat Not Completed: Patient unavailable.   Celia B. Murvin [facility], CCC-SLP [phone]  Leigh Aurora 03/04/2017, 10:44 AM

## 2017-03-04 NOTE — Progress Notes (Signed)
Physical Therapy Treatment Patient Details Name: Ralph Short MRN: [id number] DOB: Jul 26, 1946 Today's Date: 03/04/2017    History of Present Illness 71 y.o. male with medical history significant of CAD, with STEMI treated with DES,  LV thrombus development post MI - on coumadin therapy, systolic CHF with EF 25-30%, traumatic intracranial hemorrhage, DM type type II, hyperlipidemia, CKD who presented to the [facility]  with c/o right sided weakness and speech deficit.     PT Comments    Pt making good progression with functional mobility, ambulating without an AD this session. Pt continues to have speech deficits and recognizes this. Pt would continue to benefit from skilled physical therapy services at this time while admitted and after d/c to address the below listed limitations in order to improve overall safety and independence with functional mobility.    Follow Up Recommendations  Outpatient PT;Supervision - Intermittent     Equipment Recommendations  None recommended by PT    Recommendations for Other Services       Precautions / Restrictions Precautions Precautions: Fall Restrictions Weight Bearing Restrictions: No    Mobility  Bed Mobility Overal bed mobility: Modified Independent                Transfers Overall transfer level: Needs assistance Equipment used: None Transfers: Sit to/from Stand Sit to Stand: Supervision         General transfer comment: supervision for safety  Ambulation/Gait Ambulation/Gait assistance: Min guard Ambulation Distance (Feet): 500 Feet Assistive device: None Gait Pattern/deviations: Step-through pattern;Drifts right/left Gait velocity: WFL Gait velocity interpretation: at or above normal speed for age/gender General Gait Details: pt with mild instability but no overt LOB or need for physical assistance. pt occasionally bumping into objects on his R side but acknowledging the presence of those objects  stating "they need to get out of my way"   Stairs            Wheelchair Mobility    Modified Rankin (Stroke Patients Only) Modified Rankin (Stroke Patients Only) Pre-Morbid Rankin Score: No symptoms Modified Rankin: Moderate disability     Balance Overall balance assessment: Needs assistance Sitting-balance support: Feet supported Sitting balance-Leahy Scale: Good     Standing balance support: During functional activity;No upper extremity supported Standing balance-Leahy Scale: Good                              Cognition Arousal/Alertness: Awake/alert Behavior During Therapy: WFL for tasks assessed/performed Overall Cognitive Status: No family/caregiver present to determine baseline cognitive functioning                                 General Comments: Pt very talkative, changing topics quickly making it difficult to follow train of thought and thought process.  Pt has a very big personality, an unsure if this is pt baseline, or if there is an attentional and behavioral component due to stroke.  Pt feels he is at baseline cognitively, recognizing that he is having speech deficits.      Exercises      General Comments        Pertinent Vitals/Pain Pain Assessment: No/denies pain    Home Living                      Prior Function  PT Goals (current goals can now be found in the care plan section) Acute Rehab PT Goals PT Goal Formulation: With patient Time For Goal Achievement: 03/10/17 Potential to Achieve Goals: Good Progress towards PT goals: Progressing toward goals    Frequency    Min 4X/week      PT Plan Current plan remains appropriate    Co-evaluation              AM-PAC PT "6 Clicks" Daily Activity  Outcome Measure  Difficulty turning over in bed (including adjusting bedclothes, sheets and blankets)?: A Little Difficulty moving from lying on back to sitting on the side of the bed? : A  Little Difficulty sitting down on and standing up from a chair with arms (e.g., wheelchair, bedside commode, etc,.)?: A Little Help needed moving to and from a bed to chair (including a wheelchair)?: A Little Help needed walking in hospital room?: A Little Help needed climbing 3-5 steps with a railing? : A Lot 6 Click Score: 17    End of Session   Activity Tolerance: Patient tolerated treatment well Patient left: in bed;with call bell/phone within reach Nurse Communication: Mobility status PT Visit Diagnosis: Other abnormalities of gait and mobility (R26.89);Other symptoms and signs involving the nervous system (Z61.096)     Time: 0454-0981 PT Time Calculation (min) (ACUTE ONLY): 16 min  Charges:  $Gait Training: 8-22 mins                    G Codes:       [place], [place], [place] [phone]    Alessandra Bevels Jermale Crass 03/04/2017, 5:07 PM

## 2017-03-04 NOTE — Progress Notes (Addendum)
ANTICOAGULATION CONSULT NOTE  Pharmacy Consult for heparin/warfarin Indication: LV thrombus, recent CVA  Heparin Dosing Weight: 98.5 kg  Assessment: 70 yom on warfarin PTA (appears to have been d/c'd at some point) for LV thrombus admitted with ischemic stroke. Pharmacy consulted to dose heparin bridge (lower goal, no bolus in setting of stroke) + warfarin. Noted history of ICH in the past. INR subtherapeutic 0.97 on admit. Pt states he is taking every day PTA but also not a great historian - warfarin not among medications on [place] fax information or patient's bottles brought in by son. CBC wnl. No bleed documented.  Heparin level now slightly supratherapeutic at 0.58 for lower goal. No bleed or IV line issues reported per RN. INR 1.07 this AM - was not given warfarin dose last night (documented as patient/family refusal on MAR). Per discussion with RN, pt states he was taken off this med by his MD at the [place]. Pharmacy to discuss with patient.  Goal of Therapy:  INR 2-3 Heparin level 0.3-0.5 units/ml, no bolus Monitor platelets by anticoagulation protocol: Yes   Plan:  Decrease heparin to 1150 units/h Warfarin 10mg  PO x 1 dose 8h heparin level Daily heparin level/INR/CBC Monitor s/sx bleeding Warfarin education  Babs Bertin, PharmD, BCPS Clinical Pharmacist 03/04/2017 8:30 AM   ADDENDUM:  Pharmacy asked to to hold warfarin for now per discussion with Dr. Rito Ehrlich in case of procedures. Will d/c warfarin consult and orders for now. Continuing on heparin.  Babs Bertin, PharmD, BCPS Clinical Pharmacist 03/04/2017 2:28 PM

## 2017-03-05 DIAGNOSIS — I5022 Chronic systolic (congestive) heart failure: Secondary | ICD-10-CM

## 2017-03-05 LAB — GLUCOSE, CAPILLARY
GLUCOSE-CAPILLARY: 238 mg/dL — AB (ref 65–99)
GLUCOSE-CAPILLARY: 175 mg/dL — AB (ref 65–99)
Glucose-Capillary: 160 mg/dL — ABNORMAL HIGH (ref 65–99)
Glucose-Capillary: 213 mg/dL — ABNORMAL HIGH (ref 65–99)

## 2017-03-05 LAB — CBC
HCT: 41.8 % (ref 39.0–52.0)
Hemoglobin: 13.7 g/dL (ref 13.0–17.0)
MCH: 31 pg (ref 26.0–34.0)
MCHC: 32.8 g/dL (ref 30.0–36.0)
MCV: 94.6 fL (ref 78.0–100.0)
Platelets: 179 10*3/uL (ref 150–400)
RBC: 4.42 MIL/uL (ref 4.22–5.81)
RDW: 14.1 % (ref 11.5–15.5)
WBC: 6.6 10*3/uL (ref 4.0–10.5)

## 2017-03-05 LAB — BASIC METABOLIC PANEL
Anion gap: 9 (ref 5–15)
BUN: 14 mg/dL (ref 6–20)
CHLORIDE: 104 mmol/L (ref 101–111)
CO2: 25 mmol/L (ref 22–32)
CREATININE: 1.46 mg/dL — AB (ref 0.61–1.24)
Calcium: 8.8 mg/dL — ABNORMAL LOW (ref 8.9–10.3)
GFR calc Af Amer: 54 mL/min — ABNORMAL LOW (ref >60)
GFR calc non Af Amer: 47 mL/min — ABNORMAL LOW (ref >60)
GLUCOSE: 211 mg/dL — AB (ref 65–99)
POTASSIUM: 4.1 mmol/L (ref 3.5–5.1)
SODIUM: 138 mmol/L (ref 135–145)

## 2017-03-05 LAB — HEMOGLOBIN A1C
Hgb A1c MFr Bld: 8.7 % — ABNORMAL HIGH (ref 4.8–5.6)
Mean Plasma Glucose: 203 mg/dL

## 2017-03-05 LAB — HEPARIN LEVEL (UNFRACTIONATED): Heparin Unfractionated: 0.34 IU/mL (ref 0.30–0.70)

## 2017-03-05 MED ORDER — LIVING WELL WITH DIABETES BOOK
Freq: Once | Status: AC
  Administered 2017-03-05: 11:00:00
  Filled 2017-03-05: qty 1

## 2017-03-05 NOTE — Progress Notes (Signed)
Physical Therapy Treatment Patient Details Name: Ralph Short MRN: [id number] DOB: Sep 07, 1946 Today's Date: 03/05/2017    History of Present Illness 71 y.o. male with medical history significant of CAD, with STEMI treated with DES,  LV thrombus development post MI - on coumadin therapy, systolic CHF with EF 25-30%, traumatic intracranial hemorrhage, DM type type II, hyperlipidemia, CKD who presented to the [facility]  with c/o right sided weakness and speech deficit.     PT Comments    Pt continues to make progress with mobility. PT will continue to follow pt acutely to ensure a safe d/c home.   Follow Up Recommendations  Outpatient PT;Supervision - Intermittent     Equipment Recommendations  None recommended by PT    Recommendations for Other Services       Precautions / Restrictions Precautions Precautions: Fall Restrictions Weight Bearing Restrictions: No    Mobility  Bed Mobility               General bed mobility comments: pt sitting up in chair   Transfers Overall transfer level: Needs assistance Equipment used: None Transfers: Sit to/from Stand Sit to Stand: Supervision         General transfer comment: supervision for safety  Ambulation/Gait Ambulation/Gait assistance: Min guard Ambulation Distance (Feet): 500 Feet Assistive device: None Gait Pattern/deviations: Step-through pattern;Drifts right/left Gait velocity: WFL Gait velocity interpretation: at or above normal speed for age/gender General Gait Details: pt with mild instability but no overt LOB or need for physical assistance. pt occasionally bumping into objects on his R side but acknowledging the presence of those objects stating "they need to get out of my way"   Stairs            Wheelchair Mobility    Modified Rankin (Stroke Patients Only) Modified Rankin (Stroke Patients Only) Pre-Morbid Rankin Score: No symptoms Modified Rankin: Moderate disability      Balance Overall balance assessment: Needs assistance Sitting-balance support: Feet supported Sitting balance-Leahy Scale: Good     Standing balance support: During functional activity;No upper extremity supported Standing balance-Leahy Scale: Good                              Cognition Arousal/Alertness: Awake/alert Behavior During Therapy: WFL for tasks assessed/performed Overall Cognitive Status: No family/caregiver present to determine baseline cognitive functioning                                 General Comments: Pt very talkative, changing topics quickly making it difficult to follow train of thought and thought process.  Pt has a very big personality, an unsure if this is pt baseline, or if there is an attentional and behavioral component due to stroke.  Pt feels he is at baseline cognitively, recognizing that he is having speech deficits.      Exercises      General Comments        Pertinent Vitals/Pain Pain Assessment: No/denies pain    Home Living                      Prior Function            PT Goals (current goals can now be found in the care plan section) Acute Rehab PT Goals PT Goal Formulation: With patient Time For Goal Achievement: 03/10/17 Potential to Achieve Goals:  Good Progress towards PT goals: Progressing toward goals    Frequency    Min 4X/week      PT Plan Current plan remains appropriate    Co-evaluation              AM-PAC PT "6 Clicks" Daily Activity  Outcome Measure  Difficulty turning over in bed (including adjusting bedclothes, sheets and blankets)?: A Little Difficulty moving from lying on back to sitting on the side of the bed? : A Little Difficulty sitting down on and standing up from a chair with arms (e.g., wheelchair, bedside commode, etc,.)?: A Little Help needed moving to and from a bed to chair (including a wheelchair)?: A Little Help needed walking in hospital room?: A  Little Help needed climbing 3-5 steps with a railing? : A Lot 6 Click Score: 17    End of Session   Activity Tolerance: Patient tolerated treatment well Patient left: in chair;with call bell/phone within reach Nurse Communication: Mobility status PT Visit Diagnosis: Other abnormalities of gait and mobility (R26.89);Other symptoms and signs involving the nervous system (R29.898)     Time: 1100-1119 PT Time Calculation (min) (ACUTE ONLY): 19 min  Charges:  $Gait Training: 8-22 mins                    G Codes:       [place], [place], [place] [phone]    Alessandra Bevels Billijo Dilling 03/05/2017, 2:23 PM

## 2017-03-05 NOTE — Progress Notes (Signed)
[facility] PROGRESS NOTE  TRYSTEN BERNARD UEA:[id number] DOB: [birth year] DOA: 03/03/2017  PCP: No PCP Per Patient  Brief History/Interval Summary: 71 year old male with a past medical history of coronary artery disease, treated previously with a drug-eluting stent to unknown vessels, history of LV thrombus, who was on warfarin treatment, history of systolic CHF with ejection fraction of 25-30%, history of traumatic intracranial hemorrhage, history of diabetes mellitus type 2, hyperlipidemia and chronic disease, presented with the right-sided weakness and speech impairment. Patient was hospitalized. He was seen by neurology. MRI revealed stroke.  Reason for Visit: Acute stroke  Consultants: Neurology  Procedures:   Transthoracic echocardiogram Study Conclusions  - Left ventricle: No mural apical thrombus seen after definity use.   Septal apical and inferior wall hypokinesis. The cavity size was   severely dilated. Wall thickness was normal. Systolic function   was severely reduced. The estimated ejection fraction was in the   range of 25% to 30%. Doppler parameters are consistent with   abnormal left ventricular relaxation (grade 1 diastolic   dysfunction). - Left atrium: The atrium was moderately dilated. - Atrial septum: No defect or patent foramen ovale was identified.  Antibiotics: None  Subjective/Interval History: Patient's continues to improve. Denies any weakness in his right side. Speech is improving. Had a headache overnight, which has improved with Tylenol.   ROS: Denies any nausea, vomiting or chest pain.  Objective:  Vital Signs  Vitals:   03/04/17 1740 03/04/17 2053 03/05/17 0100 03/05/17 0525  BP: 121/82 119/72 111/64 123/72  Pulse: 88 86 89 97  Resp: 17 18 20 18   Temp: 98.5 F (36.9 C) 99.1 F (37.3 C) 99.5 F (37.5 C) 98.7 F (37.1 C)  TempSrc: Oral Oral Oral Oral  SpO2: 97% 96% 98% 95%  Weight:      Height:        Intake/Output Summary  (Last 24 hours) at 03/05/17 0833 Last data filed at 03/05/17 0520  Gross per 24 hour  Intake           835.47 ml  Output             1350 ml  Net          -514.53 ml   Filed Weights   03/03/17 0451  Weight: 115.3 kg (254 lb 3.2 oz)    General appearance: Awake and alert. No distress. Resp: Clear to auscultation bilaterally Cardio: S1, S2 is normal, regular. No S3, S4. No rubs, murmurs or bruit. GI: Abdomen soft. Nontender. Bowel sounds are present. No masses or organomegaly. Extremities: No edema is noted Neurologic: Patient remains awake and alert. No facial asymmetry. Motor strength is equal bilateral upper and lower extremity.   Lab Results:  Data Reviewed: I have personally reviewed following labs and imaging studies  CBC:  Recent Labs Lab 03/03/17 0058 03/04/17 0638 03/05/17 0334  WBC 5.4 5.7 6.6  NEUTROABS 2.5  --   --   HGB 13.8 13.4 13.7  HCT 41.8 41.4 41.8  MCV 95.9 94.5 94.6  PLT 167 171 179    Basic Metabolic Panel:  Recent Labs Lab 03/03/17 0058 03/05/17 0334  NA 140 138  K 4.2 4.1  CL 110 104  CO2 22 25  GLUCOSE 286* 211*  BUN 13 14  CREATININE 1.46* 1.46*  CALCIUM 8.9 8.8*    GFR: Estimated Creatinine Clearance: 59.9 mL/min (A) (by C-G formula based on SCr of 1.46 mg/dL (H)).  Liver Function Tests:  Recent Labs Lab  03/03/17 0058  AST 26  ALT 22  ALKPHOS 132*  BILITOT 0.5  PROT 7.0  ALBUMIN 3.8    Coagulation Profile:  Recent Labs Lab 03/03/17 0058 03/04/17 0638  INR 0.97 1.07    Cardiac Enzymes:  Recent Labs Lab 03/03/17 0058  TROPONINI <0.03    CBG:  Recent Labs Lab 03/04/17 0559 03/04/17 1108 03/04/17 1632 03/04/17 2049 03/05/17 0701  GLUCAP 102* 196* 217* 204* 160*    Lipid Profile:  Recent Labs  03/04/17 0638  CHOL 98  HDL 27*  LDLCALC 56  TRIG 73  CHOLHDL 3.6     Radiology Studies: Mr Maxine Glenn Neck W Wo Contrast  Result Date: 03/04/2017 CLINICAL DATA:  71 y/o  M; stroke. EXAM: MRA NECK  WITHOUT AND WITH CONTRAST TECHNIQUE: Multiplanar and multiecho pulse sequences of the neck were obtained without and with intravenous contrast. Angiographic images of the neck were obtained using MRA technique without and with intravenous contrast. CONTRAST:  20mL MULTIHANCE GADOBENATE DIMEGLUMINE 529 MG/ML IV SOLN COMPARISON:  03/03/2017 MRI and MRA of the head. FINDINGS: Aortic arch: Patent.  Bovine arch, normal variant. Right common carotid artery: Patent. Right internal carotid artery: Patent. Right vertebral artery: Patent.  Right dominant. Left common carotid artery: Patent. Left Internal carotid artery: Patent. Left Vertebral artery: The left vertebral artery origin is poorly visualized, probably artifactual. Visible vessel is widely patent. There is no evidence of high-grade stenosis, dissection, or aneurysm unless noted above. IMPRESSION: 1. Left vertebral artery origin is poorly visualized, probably artifactual. 2. Carotid and vertebral arteries are otherwise widely patent without evidence for stenosis, dissection, or aneurysm. Electronically Signed   By: Mitzi Hansen M.D.   On: 03/04/2017 22:29   Mr Brain Wo Contrast  Result Date: 03/03/2017 CLINICAL DATA:  Stroke.  Dysphasia. EXAM: MRI HEAD WITHOUT CONTRAST MRA HEAD WITHOUT CONTRAST TECHNIQUE: Multiplanar, multiecho pulse sequences of the brain and surrounding structures were obtained without intravenous contrast. Angiographic images of the head were obtained using MRA technique without contrast. COMPARISON:  Head CT from earlier today FINDINGS: MRI HEAD FINDINGS Incomplete study, T1 and coronal T2 weighted imaging was not acquired. Brain: Moderate confluent area of acute cortically based infarction in the posterior left frontal lobe, reaching the anterior parietal cortex. No hemorrhage, hydrocephalus, or mass. Gliosis in the right frontal lobe, likely post ischemic. Microvascular ischemic change in the cerebral white matter is minimal.  Vascular: Arterial findings below. Normal dural venous sinus flow voids. Skull and upper cervical spine: Negative Sinuses/Orbits: Negative MRA HEAD FINDINGS Symmetric carotid and vertebral arteries. No unusual branching pattern. No major branch occlusion, proximal stenosis, or vessel beading. Negative for aneurysm. Neck MRA was requested but could not be completed due to patient intolerance of the scanner. IMPRESSION: 1. Moderate acute infarct in the posterior left frontal lobe, nonhemorrhagic. 2. Negative intracranial MRA. 3. Moderate gliosis in the right frontal pole, likely post ischemic. 4. Incomplete study, including deferred neck MRA. Electronically Signed   By: Marnee Spring M.D.   On: 03/03/2017 20:36   Mr Maxine Glenn Headm  Result Date: 03/03/2017 CLINICAL DATA:  Stroke.  Dysphasia. EXAM: MRI HEAD WITHOUT CONTRAST MRA HEAD WITHOUT CONTRAST TECHNIQUE: Multiplanar, multiecho pulse sequences of the brain and surrounding structures were obtained without intravenous contrast. Angiographic images of the head were obtained using MRA technique without contrast. COMPARISON:  Head CT from earlier today FINDINGS: MRI HEAD FINDINGS Incomplete study, T1 and coronal T2 weighted imaging was not acquired. Brain: Moderate confluent area of acute cortically based  infarction in the posterior left frontal lobe, reaching the anterior parietal cortex. No hemorrhage, hydrocephalus, or mass. Gliosis in the right frontal lobe, likely post ischemic. Microvascular ischemic change in the cerebral white matter is minimal. Vascular: Arterial findings below. Normal dural venous sinus flow voids. Skull and upper cervical spine: Negative Sinuses/Orbits: Negative MRA HEAD FINDINGS Symmetric carotid and vertebral arteries. No unusual branching pattern. No major branch occlusion, proximal stenosis, or vessel beading. Negative for aneurysm. Neck MRA was requested but could not be completed due to patient intolerance of the scanner. IMPRESSION: 1.  Moderate acute infarct in the posterior left frontal lobe, nonhemorrhagic. 2. Negative intracranial MRA. 3. Moderate gliosis in the right frontal pole, likely post ischemic. 4. Incomplete study, including deferred neck MRA. Electronically Signed   By: Marnee Spring M.D.   On: 03/03/2017 20:36     Medications:  Scheduled: . atorvastatin  80 mg Oral q1800  . carvedilol  3.125 mg Oral BID WC  . ibuprofen  400 mg Oral Once  . insulin aspart  0-9 Units Subcutaneous TID WC  . insulin glargine  15 Units Subcutaneous QHS  . potassium chloride SA  20 mEq Oral Daily   Continuous: . 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 Neurology is following. Echocardiogram showed diminished ejection fraction. This was known previously as well. No clot noted. Discussed with neurologist, Dr. Pearlean Brownie. TEE is recommended and if negative, then loop recorder. Continue heparin for now. LDL is 56. It appears that patient was taken off of warfarin by his providers at the [place]. Depending on TEE and further input from neurology, he may have to go back on anticoagulation.   History of LV thrombus Patient has previous history of same. Patient is not very certain about his warfarin use, although he tells me that he was taken off of it by his providers at the [place]. He is just on aspirin and Plavix. Still waiting to discuss this  with his providers at the [place]. Echocardiogram does not show any thrombus. TEE is planned. Continue heparin for now.    History of CAD with DES placement in 2014 Seems to be stable from a cardiac perspective. Continue home medications. If patient needs to be on anticoagulation, he should be able to come off Plavix since his stent was placed 4 years ago.  Chronic systolic CHF Ejection fraction noted to be 25-30%. Similar to what was seen back in 2014. He is followed by cardiology at the [facility]. Seems to be well compensated at this time. Continue Coreg. Not noted to be on ACE inhibitor. Possibly due to elevated creatinine.  Essential hypertension  Blood pressure is stable. Continue to monitor.   History of Hyperlipidemia LDL is 56. Continue statin.   Diabetes mellitus type 2  HbA1c is 8.7. Continue home dose of Lantus and sliding scale insulin coverage. Monitor CBGs.   History of Gout Stable.  Continue allopurinol.   DVT Prophylaxis: Currently on IV heparin    Code Status: Full code  Family Communication: Discussed with the patient. No family at bedside  Disposition Plan: Management as outlined above. Looks like he will need only outpatient physical therapy per PT evaluation. Needs TEE and loop recorder.   LOS: 2 days   [facility]  [facility] Pager [phone] 03/05/2017, 8:33 AM  If 7PM-7AM, please contact night-coverage at [email or web address], password [facility]

## 2017-03-05 NOTE — Progress Notes (Signed)
STROKE TEAM PROGRESS NOTE   SUBJECTIVE (INTERVAL HISTORY)  He states his expressive language difficulties  Continue to improve . Transthoracic echo shows no definite intracardiac clot. OBJECTIVE Temp:  [98.5 F (36.9 C)-99.6 F (37.6 C)] 99.1 F (37.3 C) (05/03 1320) Pulse Rate:  [86-97] 90 (05/03 1320) Cardiac Rhythm: Normal sinus rhythm (05/03 0700) Resp:  [17-20] 19 (05/03 1320) BP: (111-123)/(64-82) 120/68 (05/03 1320) SpO2:  [95 %-98 %] 97 % (05/03 1320) Weight:  [257 lb (116.6 kg)] 257 lb (116.6 kg) (05/03 1320)  CBC:   Recent Labs Lab 03/03/17 0058 03/04/17 0638 03/05/17 0334  WBC 5.4 5.7 6.6  NEUTROABS 2.5  --   --   HGB 13.8 13.4 13.7  HCT 41.8 41.4 41.8  MCV 95.9 94.5 94.6  PLT 167 171 179    Basic Metabolic Panel:   Recent Labs Lab 03/03/17 0058 03/05/17 0334  NA 140 138  K 4.2 4.1  CL 110 104  CO2 22 25  GLUCOSE 286* 211*  BUN 13 14  CREATININE 1.46* 1.46*  CALCIUM 8.9 8.8*    Lipid Panel:     Component Value Date/Time   CHOL 98 03/04/2017 0638   TRIG 73 03/04/2017 0638   HDL 27 (L) 03/04/2017 0638   CHOLHDL 3.6 03/04/2017 0638   VLDL 15 03/04/2017 0638   LDLCALC 56 03/04/2017 0638   HgbA1c:  Lab Results  Component Value Date   HGBA1C 8.7 (H) 03/04/2017    PHYSICAL EXAM Obese elderly African-American male currently not in distress. . Afebrile. Head is nontraumatic. Neck is supple without bruit.    Cardiac exam no murmur or gallop. Lungs are clear to auscultation. Distal pulses are well felt. Neurological Exam ;  Awake  Alert oriented x 3. nonfluent speech with mild expressive aphasia with word finding difficulties and few paraphasic errors. Good comprehension. Difficulty with naming and repetition.[place] movements full without nystagmus.fundi were not visualized. Vision acuity and fields appear normal. Hearing is normal. Palatal movements are normal. Face symmetric. Tongue midline. Normal strength, tone, reflexes and coordination. Normal  sensation. Gait deferred.  ASSESSMENT/PLAN Ralph Short is a 71 y.o. male with history of CAD with STEMI treated with DES,  LV thrombus development post MI - on coumadin therapy, systolic CHF with EF 25-30%, intracranial hemorrhage, DM type II, hyperlipidemia, CKD  presenting with R sided weakness and aphasia/dysarthria. He did not receive IV t-PA due to  Late presentation and h/o traumatic ICH.   Left MCA branch infarct infarct embolic secondary to known cardiac thrombus with subtherapeutic INR on coumadin  Resultant  Expressive aphasia  Code Stroke CT no acute stroke. Encephalomalacia R frontal love. Old L occipital infarct. Aspects 10.   MRI  Moderate acute infarct in the posterior left frontal lobe,  nonhemorrhagicMRA head  no large vessel occlusion  MRA neck  no significant extracranial large vessel stenosis or occlusion 2D Echo  Left ventricle: No mural apical thrombus seen after definity use.   Septal apical and inferior wall hypokinesis. The cavity size was   severely dilated. Wall thickness was normal. Systolic function   was severely reduced. The estimated ejection fraction was in the    range of 25% to 30%  LDL 56  HgbA1c 8.7  Warfarin for VTE prophylaxis Diet heart healthy/carb modified Room service appropriate? Yes; Fluid consistency: Thin  aspirin 81 mg daily, clopidogrel 75 mg daily and warfarin daily prior to admission, now on no antithrombotics  Now on iv heparin bridge with warfarin  Therapy recommendations:  pending   Disposition:  Pending   LV thrombus  On coumadin along with aspirin and plavix  INR 0.97, subtherapeutic  Hypertension  Elevated as high as 178/110  Significantly lower this am at 118/71 With stroke dx, Permissive hypertension (OK if < 220/120) but gradually normalize in 5-7 days Long-term BP goal normotensive  Hyperlipidemia  Home meds:  lipitor 80, resumed in hospital  LDL pending   Continue statin at  discharge  Diabetes type II  HgbA1c pending, goal < 7.0  Other Stroke Risk Factors  Advanced age  Former Cigarette smoker  UDS / ETOH level negative   Obesity, Body mass index is 36.88 kg/m.  Hx traumatic hemorrhage  Coronary artery disease s/p MI w/ DES  Cardiac thrombus post MI on warfarin   Systolic CHF  Other Active Problems  CKD stage III  Gout  Hospital day # 2   I had a long discussion with the patient with regards to his embolic stroke and need to do transesophageal echocardiogram and loop recorder monitoring for paroxysmal atrial fibrillation. Recommend cardiology consult for the same. Aggressive control of diabetes and lipids. Continue aspirin for now. Discussed with Dr. Rito Ehrlich.  Delia Heady, MD Medical Director [facility] Pager: [phone] 03/05/2017 3:47 PM   To contact Stroke Continuity provider, please refer to [email or web address]. After hours, contact General Neurology

## 2017-03-05 NOTE — Progress Notes (Signed)
    [facility] has been requested to perform a transesophageal echocardiogram on Ralph Short for CVA.  After careful review of history and examination, the risks and benefits of transesophageal echocardiogram have been explained including risks of esophageal damage, perforation (1:10,000 risk), bleeding, pharyngeal hematoma as well as other potential complications associated with conscious sedation including aspiration, arrhythmia, respiratory failure and death. Alternatives to treatment were discussed, questions were answered. Patient is willing to proceed.   Nada Boozer, NP  03/05/2017 5:29 PM

## 2017-03-05 NOTE — Progress Notes (Signed)
Consent signed and in chart. Pt aware for TEE in the AM and to be NPO after midnight.   Sim Boast, RN

## 2017-03-05 NOTE — Progress Notes (Signed)
Inpatient Diabetes Program Recommendations  AACE/ADA: New Consensus Statement on Inpatient Glycemic Control (2015)  Target Ranges:  Prepandial:   less than 140 mg/dL      Peak postprandial:   less than 180 mg/dL (1-2 hours)      Critically ill patients:  140 - 180 mg/dL   Lab Results  Component Value Date   GLUCAP 213 (H) 03/05/2017   HGBA1C 8.7 (H) 03/04/2017    Review of Glycemic Control:  Results for Ralph Short, Ralph Short (MRN [id number]) as of 03/05/2017 16:53  Ref. Range 03/04/2017 11:08 03/04/2017 16:32 03/04/2017 20:49 03/05/2017 07:01 03/05/2017 12:01  Glucose-Capillary Latest Ref Range: 65 - 99 mg/dL 740 (H) 814 (H) 481 (H) 160 (H) 213 (H)   Diabetes history:Type 2 diabetes Outpatient Diabetes medications: Glucotrol 10 mg bid, Lantus 50 units q HS  Current orders for Inpatient glycemic control:  Novolog sensitive tid with meals, Lantus 15 units q HS  Inpatient Diabetes Program Recommendations:    Consider increasing Lantus to 25 units q HS.  Spoke with patient regarding A1C result=8.7%. He admits that he has not been as active during the winter due to it being cold.  He is motivated to improve glucose control and get A1C 7%.  He see's MD at [facility] and states that he has not seen MD in a while.  Patient travels frequently with [employer] and is motivated to get better to be able to travel again. Ordered Living well with diabetes booklet for patient and discussed "Know your numbers" handout about A1C results.  He states he plans to get back to the gym to [employer].  Patient appreciative of information and states "I know how to do this".   Thanks, Beryl Meager, RN, BC-ADM Inpatient Diabetes Coordinator Pager [phone] (8a-5p)

## 2017-03-05 NOTE — Progress Notes (Signed)
ANTICOAGULATION CONSULT NOTE - Follow Up Consult  Pharmacy Consult for heparin Indication: LV thrombus, recent CVA  Allergies  Allergen Reactions  . Penicillins Hives  . Aspirin Palpitations    Patient Measurements: Height: 5\' 10"  (177.8 cm) Weight: 254 lb 3.2 oz (115.3 kg) IBW/kg (Calculated) : 73 Heparin Dosing Weight: 98.5 kg  Labs:  Recent Labs  03/03/17 0058  03/04/17 0638 03/04/17 1602 03/04/17 2200 03/05/17 0334  HGB 13.8  --  13.4  --   --  13.7  HCT 41.8  --  41.4  --   --  41.8  PLT 167  --  171  --   --  179  APTT 32  --   --   --   --   --   LABPROT 12.9  --  14.0  --   --   --   INR 0.97  --  1.07  --   --   --   HEPARINUNFRC  --   < > 0.58 0.48 0.41 0.34  CREATININE 1.46*  --   --   --   --  1.46*  TROPONINI <0.03  --   --   --   --   --   < > = values in this interval not displayed.  Estimated Creatinine Clearance: 59.9 mL/min (A) (by C-G formula based on SCr of 1.46 mg/dL (H)).   Medications:  Infusions:  . heparin 1,150 Units/hr (03/04/17 1041)    Assessment: 70 yom on warfarin PTA (appears to have been d/c'd at some point) for LV thrombus admitted with ischemic stroke. Pharmacy consulted to dose heparin (lower goal, no bolus in setting of stroke). Warfarin d/c'd for now per MD.  Heparin level this AM remains at goal. CBC wnl. No bleed documented.  Goal of Therapy:  Heparin level 0.3-0.5 units/ml Monitor platelets by anticoagulation protocol: Yes   Plan:  Continue IV heparin at 1150 units/h Daily heparin level and CBC Monitor for s/sx bleeding Holding warfarin for now per discussion with Dr. Antony Haste, PharmD, BCPS Clinical Pharmacist 03/05/2017 8:27 AM

## 2017-03-06 ENCOUNTER — Inpatient Hospital Stay (HOSPITAL_COMMUNITY): Payer: Non-veteran care

## 2017-03-06 ENCOUNTER — Encounter (HOSPITAL_COMMUNITY)
Admission: EM | Disposition: A | Discharge status: Home / Self Care | Payer: Self-pay | Source: Home / Self Care | Attending: Internal Medicine

## 2017-03-06 ENCOUNTER — Encounter (HOSPITAL_COMMUNITY): Payer: Self-pay | Admitting: *Deleted

## 2017-03-06 DIAGNOSIS — I770 Arteriovenous fistula, acquired: Secondary | ICD-10-CM | POA: Diagnosis present

## 2017-03-06 DIAGNOSIS — I638 Other cerebral infarction: Secondary | ICD-10-CM

## 2017-03-06 HISTORY — PX: TEE WITHOUT CARDIOVERSION: SHX5443

## 2017-03-06 LAB — CBC
HCT: 40.7 % (ref 39.0–52.0)
HCT: 42.8 % (ref 39.0–52.0)
Hemoglobin: 13.1 g/dL (ref 13.0–17.0)
Hemoglobin: 13.4 g/dL (ref 13.0–17.0)
MCH: 30.3 pg (ref 26.0–34.0)
MCH: 30 pg (ref 26.0–34.0)
MCHC: 32.2 g/dL (ref 30.0–36.0)
MCHC: 31.3 g/dL (ref 30.0–36.0)
MCV: 94.2 fL (ref 78.0–100.0)
MCV: 96 fL (ref 78.0–100.0)
Platelets: 173 10*3/uL (ref 150–400)
Platelets: 162 10*3/uL (ref 150–400)
RBC: 4.32 MIL/uL (ref 4.22–5.81)
RBC: 4.46 MIL/uL (ref 4.22–5.81)
RDW: 14.2 % (ref 11.5–15.5)
RDW: 14.6 % (ref 11.5–15.5)
WBC: 6.2 10*3/uL (ref 4.0–10.5)
WBC: 4.7 10*3/uL (ref 4.0–10.5)

## 2017-03-06 LAB — GLUCOSE, CAPILLARY
GLUCOSE-CAPILLARY: 124 mg/dL — AB (ref 65–99)
Glucose-Capillary: 151 mg/dL — ABNORMAL HIGH (ref 65–99)
Glucose-Capillary: 180 mg/dL — ABNORMAL HIGH (ref 65–99)
Glucose-Capillary: 231 mg/dL — ABNORMAL HIGH (ref 65–99)

## 2017-03-06 LAB — BASIC METABOLIC PANEL
Anion gap: 8 (ref 5–15)
Anion gap: 9 (ref 5–15)
BUN: 17 mg/dL (ref 6–20)
BUN: 17 mg/dL (ref 6–20)
CO2: 23 mmol/L (ref 22–32)
CO2: 25 mmol/L (ref 22–32)
CREATININE: 1.61 mg/dL — AB (ref 0.61–1.24)
Calcium: 8.6 mg/dL — ABNORMAL LOW (ref 8.9–10.3)
Calcium: 8.7 mg/dL — ABNORMAL LOW (ref 8.9–10.3)
Chloride: 107 mmol/L (ref 101–111)
Chloride: 105 mmol/L (ref 101–111)
Creatinine, Ser: 1.56 mg/dL — ABNORMAL HIGH (ref 0.61–1.24)
GFR calc Af Amer: 50 mL/min — ABNORMAL LOW (ref >60)
GFR calc non Af Amer: 43 mL/min — ABNORMAL LOW (ref >60)
GFR, EST AFRICAN AMERICAN: 48 mL/min — AB (ref >60)
GFR, EST NON AFRICAN AMERICAN: 42 mL/min — AB (ref >60)
Glucose, Bld: 138 mg/dL — ABNORMAL HIGH (ref 65–99)
Glucose, Bld: 241 mg/dL — ABNORMAL HIGH (ref 65–99)
POTASSIUM: 3.9 mmol/L (ref 3.5–5.1)
Potassium: 4.4 mmol/L (ref 3.5–5.1)
SODIUM: 138 mmol/L (ref 135–145)
Sodium: 139 mmol/L (ref 135–145)

## 2017-03-06 LAB — HEPARIN LEVEL (UNFRACTIONATED): Heparin Unfractionated: 0.54 IU/mL (ref 0.30–0.70)

## 2017-03-06 SURGERY — ECHOCARDIOGRAM, TRANSESOPHAGEAL
Anesthesia: Moderate Sedation

## 2017-03-06 MED ORDER — FENTANYL CITRATE (PF) 100 MCG/2ML IJ SOLN
INTRAMUSCULAR | Status: DC | PRN
  Administered 2017-03-06 (×2): 25 ug via INTRAVENOUS

## 2017-03-06 MED ORDER — CLOPIDOGREL BISULFATE 75 MG PO TABS
75.0000 mg | ORAL_TABLET | Freq: Every day | ORAL | Status: DC
  Administered 2017-03-06 – 2017-03-07 (×2): 75 mg via ORAL
  Filled 2017-03-06 (×2): qty 1

## 2017-03-06 MED ORDER — BUTAMBEN-TETRACAINE-BENZOCAINE 2-2-14 % EX AERO
INHALATION_SPRAY | CUTANEOUS | Status: DC | PRN
  Administered 2017-03-06: 2 via TOPICAL

## 2017-03-06 MED ORDER — SODIUM CHLORIDE 0.9 % IV SOLN: INTRAVENOUS | Status: DC

## 2017-03-06 MED ORDER — DIPHENHYDRAMINE HCL 50 MG/ML IJ SOLN
INTRAMUSCULAR | Status: AC
  Filled 2017-03-06: qty 1

## 2017-03-06 MED ORDER — MIDAZOLAM HCL 5 MG/ML IJ SOLN
INTRAMUSCULAR | Status: AC
  Filled 2017-03-06: qty 2

## 2017-03-06 MED ORDER — MIDAZOLAM HCL 10 MG/2ML IJ SOLN
INTRAMUSCULAR | Status: DC | PRN
  Administered 2017-03-06 (×2): 2 mg via INTRAVENOUS

## 2017-03-06 MED ORDER — FENTANYL CITRATE (PF) 100 MCG/2ML IJ SOLN
INTRAMUSCULAR | Status: AC
  Filled 2017-03-06: qty 2

## 2017-03-06 MED ORDER — LIDOCAINE VISCOUS 2 % MT SOLN
OROMUCOSAL | Status: AC
  Filled 2017-03-06: qty 15

## 2017-03-06 NOTE — Progress Notes (Signed)
  Echocardiogram Echocardiogram Transesophageal has been performed.  Ralph Short 03/06/2017, 2:42 PM

## 2017-03-06 NOTE — Care Management Important Message (Signed)
Important Message  Patient Details  Name: Ralph Short MRN: [id number] Date of Birth: 08-Oct-1946   Medicare Important Message Given:  Yes    Letty Salvi 03/06/2017, 11:30 AM

## 2017-03-06 NOTE — CV Procedure (Signed)
     Transesophageal Echocardiogram Note  Ralph Short [id number] 01/12/1946  Procedure: Transesophageal Echocardiogram Indications: CVA  Procedure Details Consent: Obtained Time Out: Verified patient identification, verified procedure, site/side was marked, verified correct patient position, special equipment/implants available, Radiology Safety Procedures followed,  medications/allergies/relevent history reviewed, required imaging and test results available.  Performed  Medications: During this procedure the patient is administered a total of Versed 4 mg mg and Fentanyl 50 mcg mg to achieve and maintain moderate conscious sedation.  The patient's heart rate, blood pressure, and oxygen saturation are monitored continuously during the procedure. The period of conscious sedation is 30 minutes, of which I was present face-to-face 100% of this time.  - Left ventricle: No mural apical thrombus seen. The cavity size was   severely dilated. Wall thickness was normal. Systolic function   was severely reduced. The estimated ejection fraction was in the   range of 25% to 30%.  - Left atrium: The atrium was moderately dilated. - Atrial septum: No defect or patent foramen ovale was identified with Doppler and bubble study. - No thrombus in LA or LAA   Complications: No apparent complications Patient did tolerate procedure well.  Tobias Alexander, MD, [facility] 03/06/2017, 1:59 PM

## 2017-03-06 NOTE — Interval H&P Note (Signed)
History and Physical Interval Note:  03/06/2017 1:35 PM  Ralph Short  has presented today for surgery, with the diagnosis of STROKE  The various methods of treatment have been discussed with the patient and family. After consideration of risks, benefits and other options for treatment, the patient has consented to  Procedure(s): TRANSESOPHAGEAL ECHOCARDIOGRAM (TEE) (N/A) as a surgical intervention .  The patient's history has been reviewed, patient examined, no change in status, stable for surgery.  I have reviewed the patient's chart and labs.  Questions were answered to the patient's satisfaction.     Iriana Artley   

## 2017-03-06 NOTE — Progress Notes (Signed)
STROKE TEAM PROGRESS NOTE   SUBJECTIVE (INTERVAL HISTORY)  He states his expressive language difficulties have  improved . He is willing to have TEE but declines loop recorder insertion. States he would [facility] and discuss this further  OBJECTIVE Temp:  [98 F (36.7 C)-98.9 F (37.2 C)] 98 F (36.7 C) (05/04 1243) Pulse Rate:  [78-91] 78 (05/04 1343) Cardiac Rhythm: Normal sinus rhythm (05/04 0700) Resp:  [16-18] 18 (05/04 1343) BP: (118-134)/(67-77) 119/77 (05/04 1243) SpO2:  [96 %-98 %] 97 % (05/04 1343)  CBC:   Recent Labs Lab 03/03/17 0058  03/05/17 0334 03/06/17 0435  WBC 5.4  < > 6.6 6.2  NEUTROABS 2.5  --   --   --   HGB 13.8  < > 13.7 13.1  HCT 41.8  < > 41.8 40.7  MCV 95.9  < > 94.6 94.2  PLT 167  < > 179 173  < > = values in this interval not displayed.  Basic Metabolic Panel:   Recent Labs Lab 03/05/17 0334 03/06/17 0435  NA 138 138  K 4.1 3.9  CL 104 107  CO2 25 23  GLUCOSE 211* 138*  BUN 14 17  CREATININE 1.46* 1.61*  CALCIUM 8.8* 8.6*    Lipid Panel:     Component Value Date/Time   CHOL 98 03/04/2017 0638   TRIG 73 03/04/2017 0638   HDL 27 (L) 03/04/2017 0638   CHOLHDL 3.6 03/04/2017 0638   VLDL 15 03/04/2017 0638   LDLCALC 56 03/04/2017 0638   HgbA1c:  Lab Results  Component Value Date   HGBA1C 8.7 (H) 03/04/2017    PHYSICAL EXAM Obese elderly African-American male currently not in distress. . Afebrile. Head is nontraumatic. Neck is supple without bruit.    Cardiac exam no murmur or gallop. Lungs are clear to auscultation. Distal pulses are well felt. Neurological Exam ;  Awake  Alert oriented x 3. nonfluent speech with mild expressive aphasia with word finding difficulties and few paraphasic errors. Good comprehension. Difficulty with naming and repetition.[place] movements full without nystagmus.fundi were not visualized. Vision acuity and fields appear normal. Hearing is normal. Palatal  movements are normal. Face symmetric. Tongue midline. Normal strength, tone, reflexes and coordination. Normal sensation. Gait deferred.  ASSESSMENT/PLAN Mr. ZAIDEN LUDLUM is a 71 y.o. male with history of CAD with STEMI treated with DES,  LV thrombus development post MI - on coumadin therapy, systolic CHF with EF 25-30%, intracranial hemorrhage, DM type II, hyperlipidemia, CKD  presenting with R sided weakness and aphasia/dysarthria. He did not receive IV t-PA due to  Late presentation and h/o traumatic ICH.   Left MCA branch infarct infarct embolic secondary to known cardiac thrombus with subtherapeutic INR on coumadin  Resultant  Expressive aphasia  Code Stroke CT no acute stroke. Encephalomalacia R frontal love. Old L occipital infarct. Aspects 10.   MRI  Moderate acute infarct in the posterior left frontal lobe,  nonhemorrhagicMRA head  no large vessel occlusion  MRA neck  no significant extracranial large vessel stenosis or occlusion 2D Echo  Left ventricle: No mural apical thrombus seen after definity use.   Septal apical and inferior wall hypokinesis. The cavity size was   severely dilated. Wall thickness was normal. Systolic function   was severely reduced. The estimated ejection fraction was in the    range of 25% to 30%  LDL 56  HgbA1c 8.7  Warfarin for VTE prophylaxis Diet NPO time  specified  aspirin 81 mg daily, clopidogrel 75 mg daily and warfarin daily prior to admission, now on no antithrombotics  Now on iv heparin bridge with warfarin  Therapy recommendations:  pending   Disposition:  Pending   LV thrombus  On coumadin along with aspirin and plavix  INR 0.97, subtherapeutic  Hypertension  Elevated as high as 178/110  Significantly lower this am at 118/71 With stroke dx, Permissive hypertension (OK if < 220/120) but gradually normalize in 5-7 days Long-term BP goal normotensive  Hyperlipidemia  Home meds:  lipitor 80, resumed in hospital  LDL  pending   Continue statin at discharge  Diabetes type II  HgbA1c pending, goal < 7.0  Other Stroke Risk Factors  Advanced age  Former Cigarette smoker  UDS / ETOH level negative   Obesity, Body mass index is 36.88 kg/m.  Hx traumatic hemorrhage  Coronary artery disease s/p MI w/ DES  Cardiac thrombus post MI on warfarin   Systolic CHF  Other Active Problems  CKD stage III  Gout  Hospital day # 3   I had a long discussion with the patient with regards to his embolic stroke and need to do transesophageal echocardiogram and loop recorder monitoring for paroxysmal atrial fibrillation. The patient is refusing loop recorder as well as 30 day outpatient not monitoring but states he will discuss this with his outpatient cardiologist at the [facility]. Check TEE and if no clot is found discontinue heparin and send him home on aspirin alone. Aggressive control of diabetes and lipids.  . Discussed with Dr. Rito Ehrlich.  Delia Heady, MD Medical Director [facility] Pager: [phone] 03/06/2017 2:08 PM   To contact Stroke Continuity provider, please refer to [email or web address]. After hours, contact General Neurology

## 2017-03-06 NOTE — Progress Notes (Addendum)
[facility] PROGRESS NOTE  Ralph Short ZOX:[id number] DOB: [birth year] DOA: 03/03/2017  PCP: No PCP Per Patient  Brief History/Interval Summary: 71 year old male with a past medical history of coronary artery disease, treated previously with a drug-eluting stent to unknown vessels, history of LV thrombus, who was on warfarin treatment, history of systolic CHF with ejection fraction of 25-30%, history of traumatic intracranial hemorrhage, history of diabetes mellitus type 2, hyperlipidemia and chronic disease, presented with the right-sided weakness and speech impairment. Patient was hospitalized. He was seen by neurology. MRI revealed stroke. Concern is for embolic stroke. Plan is for TEE today.  Reason for Visit: Acute stroke  Consultants: Neurology  Procedures:   Transthoracic echocardiogram Study Conclusions  - Left ventricle: No mural apical thrombus seen after definity use.   Septal apical and inferior wall hypokinesis. The cavity size was   severely dilated. Wall thickness was normal. Systolic function   was severely reduced. The estimated ejection fraction was in the   range of 25% to 30%. Doppler parameters are consistent with   abnormal left ventricular relaxation (grade 1 diastolic   dysfunction). - Left atrium: The atrium was moderately dilated. - Atrial septum: No defect or patent foramen ovale was identified.  Antibiotics: None  Subjective/Interval History: Patient feels better. States that his speech is almost back to normal. Strength in the right upper and lower extremity has significantly improved.   ROS: Denies any nausea or vomiting.  Objective:  Vital Signs  Vitals:   03/05/17 1743 03/05/17 2149 03/06/17 0059 03/06/17 0601  BP: 129/68 118/67 128/70 134/67  Pulse: 84 91 86 81  Resp: 17 16 17 17   Temp: 98.8 F (37.1 C) 98.6 F (37 C) 98.8 F (37.1 C) 98.7 F (37.1 C)  TempSrc: Oral Oral Oral Oral  SpO2: 98% 97% 98% 98%  Weight:        Height:        Intake/Output Summary (Last 24 hours) at 03/06/17 0807 Last data filed at 03/06/17 0100  Gross per 24 hour  Intake          1252.49 ml  Output             1150 ml  Net           102.49 ml   Filed Weights   03/03/17 0451 03/05/17 1320  Weight: 115.3 kg (254 lb 3.2 oz) 116.6 kg (257 lb)    General appearance: Awake and alert. In no distress. Resp: Clear to auscultation bilaterally. Cardio: S1, S2 is normal, regular. No S3, S4. No rubs, murmurs bruit. GI: Abdomen is soft. Nontender, nondistended. Bowel sounds are present. Extremities: No edema Neurologic: Patient remains awake and alert. No facial asymmetry. Motor strength is equal bilateral upper and lower extremity.   Lab Results:  Data Reviewed: I have personally reviewed following labs and imaging studies  CBC:  Recent Labs Lab 03/03/17 0058 03/04/17 0638 03/05/17 0334 03/06/17 0435  WBC 5.4 5.7 6.6 6.2  NEUTROABS 2.5  --   --   --   HGB 13.8 13.4 13.7 13.1  HCT 41.8 41.4 41.8 40.7  MCV 95.9 94.5 94.6 94.2  PLT 167 171 179 173    Basic Metabolic Panel:  Recent Labs Lab 03/03/17 0058 03/05/17 0334 03/06/17 0435  NA 140 138 138  K 4.2 4.1 3.9  CL 110 104 107  CO2 22 25 23   GLUCOSE 286* 211* 138*  BUN 13 14 17   CREATININE 1.46* 1.46* 1.61*  CALCIUM  8.9 8.8* 8.6*    GFR: Estimated Creatinine Clearance: 54.6 mL/min (A) (by C-G formula based on SCr of 1.61 mg/dL (H)).  Liver Function Tests:  Recent Labs Lab 03/03/17 0058  AST 26  ALT 22  ALKPHOS 132*  BILITOT 0.5  PROT 7.0  ALBUMIN 3.8    Coagulation Profile:  Recent Labs Lab 03/03/17 0058 03/04/17 0638  INR 0.97 1.07    Cardiac Enzymes:  Recent Labs Lab 03/03/17 0058  TROPONINI <0.03    CBG:  Recent Labs Lab 03/05/17 0701 03/05/17 1201 03/05/17 1612 03/05/17 2219 03/06/17 0610  GLUCAP 160* 213* 238* 175* 124*    Lipid Profile:  Recent Labs  03/04/17 0638  CHOL 98  HDL 27*  LDLCALC 56  TRIG 73   CHOLHDL 3.6     Radiology Studies: Mr Maxine Glenn Neck W Wo Contrast  Result Date: 03/04/2017 CLINICAL DATA:  71 y/o  M; stroke. EXAM: MRA NECK WITHOUT AND WITH CONTRAST TECHNIQUE: Multiplanar and multiecho pulse sequences of the neck were obtained without and with intravenous contrast. Angiographic images of the neck were obtained using MRA technique without and with intravenous contrast. CONTRAST:  20mL MULTIHANCE GADOBENATE DIMEGLUMINE 529 MG/ML IV SOLN COMPARISON:  03/03/2017 MRI and MRA of the head. FINDINGS: Aortic arch: Patent.  Bovine arch, normal variant. Right common carotid artery: Patent. Right internal carotid artery: Patent. Right vertebral artery: Patent.  Right dominant. Left common carotid artery: Patent. Left Internal carotid artery: Patent. Left Vertebral artery: The left vertebral artery origin is poorly visualized, probably artifactual. Visible vessel is widely patent. There is no evidence of high-grade stenosis, dissection, or aneurysm unless noted above. IMPRESSION: 1. Left vertebral artery origin is poorly visualized, probably artifactual. 2. Carotid and vertebral arteries are otherwise widely patent without evidence for stenosis, dissection, or aneurysm. Electronically Signed   By: Mitzi Hansen M.D.   On: 03/04/2017 22:29     Medications:  Scheduled: . atorvastatin  80 mg Oral q1800  . carvedilol  3.125 mg Oral BID WC  . ibuprofen  400 mg Oral Once  . insulin aspart  0-9 Units Subcutaneous TID WC  . insulin glargine  15 Units Subcutaneous QHS  . potassium chloride SA  20 mEq Oral Daily   Continuous: . heparin 1,150 Units/hr (03/05/17 1010)   WUJ:[id number], methocarbamol, nitroGLYCERIN  Assessment/Plan:  Principal Problem:   Acute ischemic stroke [facility]) Active Problems:   CAD S/P- PCI  (Xience DES) to prox LAD and mid LAD 07/05/13   Gout   DM (diabetes mellitus), type 2, uncontrolled, recently began insulin   HTN (hypertension)   CKD (chronic kidney  disease) stage 3, GFR 30-59 ml/min   Left ventricular apical thrombus following Anterior STEMI   Long term (current) use of anticoagulants   Right sided weakness   Diabetes mellitus with complication (HCC)    Acute ischemic stroke with right sided weakness Acute stroke noted on MRI brain. Neurology is following. Echocardiogram showed diminished ejection fraction. This was known previously as well. No clot noted. Discussed with neurologist, Dr. Pearlean Brownie. TEE was recommended and if negative, then loop recorder. TEE to be done this afternoon. Continue heparin for now. LDL is 56. It appears that patient was taken off of warfarin by his providers at the [place], although it's unclear when this occurred. Depending on TEE and further input from neurology, he may have to go back on anticoagulation.   History of LV thrombus Patient has previous history of same. Patient is not very certain about his  warfarin use, although he tells me that he was taken off of it by his providers at the [place]. He is just on aspirin and Plavix. Still waiting to discuss this with his providers at the [place]. Echocardiogram does not show any thrombus. TEE is planned. Continue heparin for now.    History of CAD with DES placement in 2014 Seems to be stable from a cardiac perspective. Continue home medications. If patient needs to be on anticoagulation, he should be able to come off Plavix since his stent was placed 4 years ago.  Chronic systolic CHF Ejection fraction noted to be 25-30%. Similar to what was seen back in 2014. He is followed by cardiology at the [facility] Seems to be well compensated at this time. Continue Coreg. Not noted to be on ACE inhibitor, possibly due to CKD.  Essential hypertension  Blood pressure is stable. Continue to monitor.   History of Hyperlipidemia LDL is 56. Continue statin.   Diabetes mellitus type 2  HbA1c is 8.7. Continue home dose of Lantus and sliding scale insulin coverage. Monitor CBGs.    Chronic kidney disease, stage 3 Creatinine slightly higher today compared to yesterday. Monitor urine output. Avoid nephrotoxic agents.  History of Gout Stable. Continue allopurinol.   DVT Prophylaxis: Currently on IV heparin    Code Status: Full code  Family Communication: Discussed with the patient. No family at bedside  Disposition Plan: Management as outlined above. Looks like he will need only outpatient physical therapy per PT evaluation. TEE today.   LOS: 3 days   [facility]  [facility] Pager [phone] 03/06/2017, 8:07 AM  If 7PM-7AM, please contact night-coverage at [email or web address], password [facility]

## 2017-03-06 NOTE — Consult Note (Signed)
  ELECTROPHYSIOLOGY CONSULT NOTE  Patient ID: Ralph Short MRN: [id number], DOB/AGE: 06/27/1946   Admit date: 03/03/2017 Date of Consult: 03/06/2017  Primary Physician: No PCP Per Patient Primary Cardiologist: Dr. Berry (2014) Reason for Consultation: Cryptogenic stroke ; recommendations regarding Implantable Loop Recorder  History of Present Illness Ralph Short was admitted on 03/03/2017 with acute CVA.  They first developed symptoms of difficult speech.  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  PMHx includes HTN, DM, CAD, 2014 had STEMI associated with low EF and LV thrombus treated with coumadin (though no longer on it per the patient), [facility], CRI, and a traumatic ICH.   he has undergone workup for stroke including echocardiogram and carotid angio.  The patient has been monitored on telemetry which has demonstrated sinus rhythm with no arrhythmias.  Inpatient stroke work-up is to be completed with a TEE.   Echocardiogram this admission demonstrated   Study Conclusions - Left ventricle: No mural apical thrombus seen after definity use.   Septal apical and inferior wall hypokinesis. The cavity size was   severely dilated. Wall thickness was normal. Systolic function   was severely reduced. The estimated ejection fraction was in the   range of 25% to 30%. Doppler parameters are consistent with   abnormal left ventricular relaxation (grade 1 diastolic   dysfunction). - Left atrium: The atrium was moderately dilated. - Atrial septum: No defect or patent foramen ovale was identified.   Lab work is reviewed.  Prior to admission, the patient denies chest pain, shortness of breath, dizziness, palpitations, or syncope.  They are recovering from their stroke with disposition pending at discharge.  Ralph Short is a 70 y.o. male who is being seen today for the evaluation of ILR at the request  of Dr. Sethi. .     Past Medical History:  Diagnosis Date  . CAD S/P percutaneous coronary angioplasty - PCI LAD LAD (Xience DES) to prox LAD and mid LAD 07/05/13 07/05/2013  . CKD (chronic kidney disease) stage 3, GFR 30-59 ml/min 07/06/2013  . Diabetes mellitus without complication (HCC)   . Diverticulitis   . Diverticulitis   . DM (diabetes mellitus), type 2, uncontrolled, recently began insulin 07/05/2013  . Gout 07/05/2013  . History of agent Orange exposure 07/05/2013  . Hypertension   . LV dysfunction, s/p MI, 07/05/13 07/05/2013  . Presence of drug coated stent in LAD coronary artery 07/05/2013   2 Xience Xpedition DES to prox & mid LAD -- in STEMI   . STEMI (ST elevation myocardial infarction)of ANT wall-total occ. of LAD  07/05/2013     Surgical History:  Past Surgical History:  Procedure Laterality Date  . COLON SURGERY    . CORONARY ANGIOPLASTY WITH STENT PLACEMENT  07/05/13   emergency PCI and stenting in the setting of anterior STEMI a total proximal LAD with excellent angiographic result and a door to balloon time of 25 minutes.   . KNEE SURGERY    . LEFT HEART CATHETERIZATION WITH CORONARY ANGIOGRAM N/A 07/05/2013   Procedure: LEFT HEART CATHETERIZATION WITH CORONARY ANGIOGRAM;  Surgeon: Jonathan J Berry, MD;  Location: [facility]ATH [facility];  Service: Cardiovascular;  Laterality: N/A;     Prescriptions Prior to Admission  Medication Sig Dispense Refill Last Dose  . allopurinol (ZYLOPRIM) 100 MG tablet Take 100 mg by mouth daily.    03/02/2017 at Unknown time  . ARTIFICIAL TEAR   OP Place 1 drop into both eyes at bedtime.   03/02/2017 at Unknown time  . atorvastatin (LIPITOR) 80 MG tablet Take 1 tablet (80 mg total) by mouth daily at 6 PM. 30 tablet 5 03/02/2017 at Unknown time  . carvedilol (COREG) 6.25 MG tablet Take 6.25 mg by mouth 2 (two) times daily with a meal.   03/02/2017 at 1900  . clopidogrel (PLAVIX) 75 MG tablet Take 1 tablet (75 mg total) by mouth daily. 90 tablet 3 03/02/2017 at 0600    . clotrimazole (LOTRIMIN) 1 % cream Apply 1 application topically as needed (for skin).   Past Week at Unknown time  . glipiZIDE (GLUCOTROL) 10 MG tablet Take 10 mg by mouth 2 (two) times daily before a meal.   03/02/2017 at Unknown time  . insulin glargine (LANTUS) 100 UNIT/ML injection Inject 50 Units into the skin at bedtime.    03/02/2017 at Unknown time  . meclizine (ANTIVERT) 25 MG tablet Take 1 tablet (25 mg total) by mouth 3 (three) times daily as needed for dizziness. 20 tablet 0 PRN  . nitroGLYCERIN (NITROSTAT) 0.4 MG SL tablet Place 1 tablet (0.4 mg total) under the tongue every 5 (five) minutes as needed for chest pain. 25 tablet 2 PRN  . potassium chloride SA (K-DUR,KLOR-CON) 20 MEQ tablet Take 1 tablet (20 mEq total) by mouth daily. 30 tablet 5 03/02/2017 at Unknown time    Inpatient Medications:  . atorvastatin  80 mg Oral q1800  . carvedilol  3.125 mg Oral BID WC  . ibuprofen  400 mg Oral Once  . insulin aspart  0-9 Units Subcutaneous TID WC  . insulin glargine  15 Units Subcutaneous QHS  . potassium chloride SA  20 mEq Oral Daily    Allergies:  Allergies  Allergen Reactions  . Penicillins Hives  . Aspirin Palpitations    Social History   Social History  . Marital status: Single    Spouse name: N/A  . Number of children: N/A  . Years of education: N/A   Occupational History  . Not on file.   Social History Main Topics  . Smoking status: Former Smoker    Quit date: 11/03/1992  . Smokeless tobacco: Never Used  . Alcohol use No  . Drug use: No  . Sexual activity: Not on file   Other Topics Concern  . Not on file   Social History Narrative  . No narrative on file     Family History  Problem Relation Age of Onset  . Alzheimer's disease Mother   . Hyperlipidemia Mother   . Hypertension Mother   . Diabetes type II Mother   . Hypertension Father   . Alzheimer's disease Father   . Diabetes type II Father   . Diabetes type II Sister   . Hypertension  Sister   . Heart disease Sister   . Heart disease Brother   . Hypertension Brother   . Diabetes type II Brother   . Diabetes type II Sister   . Hypertension Sister   . Diabetes type II Sister   . Hypertension Sister   . Heart disease Sister   . Diabetes type II Sister   . Hypertension Sister   . Diabetes type II Sister   . Hypertension Sister   . Diabetes type II Sister   . Hypertension Sister   . Hypertension Brother   . Diabetes type II Brother   . Hypertension Brother   . Diabetes type II Brother   .   Spina bifida Son       Review of Systems: All other systems reviewed and are otherwise negative except as noted above.  Physical Exam: Vitals:   03/05/17 2149 03/06/17 0059 03/06/17 0601 03/06/17 1057  BP: 118/67 128/70 134/67 128/77  Pulse: 91 86 81 87  Resp: 16 17 17 18  Temp: 98.6 F (37 C) 98.8 F (37.1 C) 98.7 F (37.1 C) 98.9 F (37.2 C)  TempSrc: Oral Oral Oral Oral  SpO2: 97% 98% 98% 96%  Weight:      Height:        The patient is examined by Dr. Asaad Gulley GEN- The patient is well appearing, alert and oriented x 3 today.   Head- normocephalic, atraumatic Eyes-  Sclera clear, conjunctiva pink Ears- hearing intact Oropharynx- clear Neck- supple Lungs- CTA b/l, normal work of breathing Heart- RRR, no murmurs, rubs or gallops  GI- soft, NT, ND Extremities- no clubbing, cyanosis, or edema MS- no significant deformity or atrophy Skin- no rash or lesion Psych- euthymic mood, full affect   Labs:   Lab Results  Component Value Date   WBC 6.2 03/06/2017   HGB 13.1 03/06/2017   HCT 40.7 03/06/2017   MCV 94.2 03/06/2017   PLT 173 03/06/2017    Recent Labs Lab 03/03/17 0058  03/06/17 0435  NA 140  < > 138  K 4.2  < > 3.9  CL 110  < > 107  CO2 22  < > 23  BUN 13  < > 17  CREATININE 1.46*  < > 1.61*  CALCIUM 8.9  < > 8.6*  PROT 7.0  --   --   BILITOT 0.5  --   --   ALKPHOS 132*  --   --   ALT 22  --   --   AST 26  --   --   GLUCOSE 286*  < >  138*  < > = values in this interval not displayed. Lab Results  Component Value Date   CKTOTAL 6,829 (H) 07/05/2013   CKMB 239.7 (HH) 07/05/2013   TROPONINI <0.03 03/03/2017   Lab Results  Component Value Date   CHOL 98 03/04/2017   CHOL 137 07/06/2013   Lab Results  Component Value Date   HDL 27 (L) 03/04/2017   HDL 29 (L) 07/06/2013   Lab Results  Component Value Date   LDLCALC 56 03/04/2017   LDLCALC 81 07/06/2013   Lab Results  Component Value Date   TRIG 73 03/04/2017   TRIG 134 07/06/2013   Lab Results  Component Value Date   CHOLHDL 3.6 03/04/2017   CHOLHDL 4.7 07/06/2013   No results found for: LDLDIRECT  No results found for: DDIMER   Radiology/Studies:  Mr Mra Neck W Wo Contrast Result Date: 03/04/2017 CLINICAL DATA:  70 y/o  M; stroke. EXAM: MRA NECK WITHOUT AND WITH CONTRAST TECHNIQUE: Multiplanar and multiecho pulse sequences of the neck were obtained without and with intravenous contrast. Angiographic images of the neck were obtained using MRA technique without and with intravenous contrast. CONTRAST:  20mL MULTIHANCE GADOBENATE DIMEGLUMINE 529 MG/ML IV SOLN COMPARISON:  03/03/2017 MRI and MRA of the head. FINDINGS: Aortic arch: Patent.  Bovine arch, normal variant. Right common carotid artery: Patent. Right internal carotid artery: Patent. Right vertebral artery: Patent.  Right dominant. Left common carotid artery: Patent. Left Internal carotid artery: Patent. Left Vertebral artery: The left vertebral artery origin is poorly visualized, probably artifactual. Visible vessel is widely patent. There is   no evidence of high-grade stenosis, dissection, or aneurysm unless noted above. IMPRESSION: 1. Left vertebral artery origin is poorly visualized, probably artifactual. 2. Carotid and vertebral arteries are otherwise widely patent without evidence for stenosis, dissection, or aneurysm. Electronically Signed   By: Lance  Furusawa-Stratton M.D.   On: 03/04/2017 22:29     Mr Brain Wo Contrast Result Date: 03/03/2017 CLINICAL DATA:  Stroke.  Dysphasia. EXAM: MRI HEAD WITHOUT CONTRAST MRA HEAD WITHOUT CONTRAST TECHNIQUE: Multiplanar, multiecho pulse sequences of the brain and surrounding structures were obtained without intravenous contrast. Angiographic images of the head were obtained using MRA technique without contrast. COMPARISON:  Head CT from earlier today FINDINGS: MRI HEAD FINDINGS Incomplete study, T1 and coronal T2 weighted imaging was not acquired. Brain: Moderate confluent area of acute cortically based infarction in the posterior left frontal lobe, reaching the anterior parietal cortex. No hemorrhage, hydrocephalus, or mass. Gliosis in the right frontal lobe, likely post ischemic. Microvascular ischemic change in the cerebral white matter is minimal. Vascular: Arterial findings below. Normal dural venous sinus flow voids. Skull and upper cervical spine: Negative Sinuses/Orbits: Negative MRA HEAD FINDINGS Symmetric carotid and vertebral arteries. No unusual branching pattern. No major branch occlusion, proximal stenosis, or vessel beading. Negative for aneurysm. Neck MRA was requested but could not be completed due to patient intolerance of the scanner. IMPRESSION: 1. Moderate acute infarct in the posterior left frontal lobe, nonhemorrhagic. 2. Negative intracranial MRA. 3. Moderate gliosis in the right frontal pole, likely post ischemic. 4. Incomplete study, including deferred neck MRA. Electronically Signed   By: Jonathon  Watts M.D.   On: 03/03/2017 20:36   Mr Mra Headm Result Date: 03/03/2017 CLINICAL DATA:  Stroke.  Dysphasia. EXAM: MRI HEAD WITHOUT CONTRAST MRA HEAD WITHOUT CONTRAST TECHNIQUE: Multiplanar, multiecho pulse sequences of the brain and surrounding structures were obtained without intravenous contrast. Angiographic images of the head were obtained using MRA technique without contrast. COMPARISON:  Head CT from earlier today FINDINGS: MRI HEAD  FINDINGS Incomplete study, T1 and coronal T2 weighted imaging was not acquired. Brain: Moderate confluent area of acute cortically based infarction in the posterior left frontal lobe, reaching the anterior parietal cortex. No hemorrhage, hydrocephalus, or mass. Gliosis in the right frontal lobe, likely post ischemic. Microvascular ischemic change in the cerebral white matter is minimal. Vascular: Arterial findings below. Normal dural venous sinus flow voids. Skull and upper cervical spine: Negative Sinuses/Orbits: Negative MRA HEAD FINDINGS Symmetric carotid and vertebral arteries. No unusual branching pattern. No major branch occlusion, proximal stenosis, or vessel beading. Negative for aneurysm. Neck MRA was requested but could not be completed due to patient intolerance of the scanner. IMPRESSION: 1. Moderate acute infarct in the posterior left frontal lobe, nonhemorrhagic. 2. Negative intracranial MRA. 3. Moderate gliosis in the right frontal pole, likely post ischemic. 4. Incomplete study, including deferred neck MRA. Electronically Signed   By: Jonathon  Watts M.D.   On: 03/03/2017 20:36   Ct Head Code Stroke W/o Cm Result Date: 03/03/2017 CLINICAL DATA:  Code stroke. Initial evaluation for acute right-sided weakness. EXAM: CT HEAD WITHOUT CONTRAST TECHNIQUE: Contiguous axial images were obtained from the base of the skull through the vertex without intravenous contrast. COMPARISON:  Prior CT from 03/08/2015. FINDINGS: Brain: Generalized age-related cerebral atrophy. Chronic encephalomalacia within the anterior inferior right frontal lobe, unchanged. Small remote left occipital lobe infarct noted as well. No acute intracranial hemorrhage. No evidence for acute large vessel territory infarct. No mass lesion, midline shift or mass effect. No hydrocephalus.   No extra-axial fluid collection. Vascular: No asymmetric hyperdense vessel. Scattered vascular calcifications noted within the carotid siphons. Skull: Scalp  soft tissues demonstrate no acute abnormality. Calvarium intact. Sinuses/Orbits: Globes and orbital soft tissues within normal limits. Paranasal sinuses are clear. No mastoid effusion. ASPECTS (Alberta Stroke Program Early CT Score) - Ganglionic level infarction (caudate, lentiform nuclei, internal capsule, insula, M1-M3 cortex): 7 - Supraganglionic infarction (M4-M6 cortex): 3 Total score (0-10 with 10 being normal): 10 IMPRESSION: 1. No acute intracranial infarct or other process identified. 2. ASPECTS is 10 3. Encephalomalacia within the anterior right frontal lobe, stable. 4. Small remote left occipital lobe infarct, also unchanged. Critical Value/emergent results were called by telephone at the time of interpretation on 03/03/2017 at 1:37 am to Dr. APRIL PALUMBO , who verbally acknowledged these results. Electronically Signed   By: Benjamin  McClintock M.D.   On: 03/03/2017 01:42    12-lead ECG SR only All prior EKG's in EPIC reviewed with no documented atrial fibrillation Telemetry SR only  Assessment and Plan:  1. Cryptogenic stroke The patient presents with cryptogenic stroke.  The patient has a TEE planned for this AM.  I spoke at length with the patient about monitoring for afib with either a 30 day event monitor or an implantable loop recorder.  Risks, benefits, and alteratives to implantable loop recorder were discussed with the patient today.   At this time, the patient is very clear in his decision that he does not want to pursue any kind of heart monitoring (discussed as well ability to monitor for V arrhythmias given his CM) but he is firm that he Nathaneal Sommers [facility] and make a decision with them.  He does not want us to follow out patient.  Please call with questions.   Renee Lynn Ursuy, PA-C 03/06/2017  I have seen and examined this patient with Renee Ursuy.  Agree with above, note added to reflect my findings.  On exam, RRR, no murmurs, lungs clear.  Patient presented  with speech difficulty found to have a left MCA stroke. TTE shows a low EF which is similar to prior. TEE scheduled. Discussed 30 day monitor with the patient for monitoring of AF prior to possible ICD implant in the future. The patient has declined 30 day monitor. He wishes to follow up with his outpatient cardiologist.  Abiha Lukehart M. Kaisey Huseby MD 03/06/2017 12:59 PM   

## 2017-03-06 NOTE — Progress Notes (Signed)
Occupational Therapy Treatment Patient Details Name: Ralph Short MRN: [id number] DOB: October 10, 1946 Today's Date: 03/06/2017    History of present illness 71 y.o. male with medical history significant of CAD, with STEMI treated with DES,  LV thrombus development post MI - on coumadin therapy, systolic CHF with EF 25-30%, traumatic intracranial hemorrhage, DM type type II, hyperlipidemia, CKD who presented to the [facility]  with c/o right sided weakness and speech deficit.    OT comments  Pt is able to perform ADLs at supervision level.  Cognition continues to be very difficult to accurately assess due to pt's personality, and he's very tangential and talkative.  He becomes very evasive when I attempt to do any semi formal assessment.   Feel he would benefit from OPOT, and feel he shouldn't drive until cleared by OPOT - he self distracts frequently and unsure if this is just his baseline behaviors, or if this is indicative of new cognitive deficit.    Follow Up Recommendations  Outpatient OT;Supervision - Intermittent    Equipment Recommendations  None recommended by OT    Recommendations for Other Services      Precautions / Restrictions Precautions Precautions: Fall       Mobility Bed Mobility Overal bed mobility: Modified Independent                Transfers Overall transfer level: Needs assistance Equipment used: None Transfers: Sit to/from Stand Sit to Stand: Supervision         General transfer comment: supervision for safety    Balance Overall balance assessment: Needs assistance Sitting-balance support: Feet supported Sitting balance-Leahy Scale: Good     Standing balance support: During functional activity;No upper extremity supported Standing balance-Leahy Scale: Good                             ADL either performed or assessed with clinical judgement   ADL                                         General  ADL Comments: requires supervision for ADLs      Vision   Additional Comments: Pt now has his glasses, and insists his vision is at baseline    Perception     Praxis      Cognition Arousal/Alertness: Awake/alert Behavior During Therapy: WFL for tasks assessed/performed Overall Cognitive Status: No family/caregiver present to determine baseline cognitive functioning                                 General Comments: contiues to be very difficult to assess cognition as Pt very talkative, changing topics quickly making it difficult to follow train of thought and thought process.  Pt has a very big personality, an unsure if this is pt baseline, or if there is an attentional and behavioral component due to stroke.  Pt feels he is at baseline cognitively.  He becomse very evasive when therapist attempts to redirect him or attempt to assess cognition more formally         Exercises     Shoulder Instructions       General Comments Pt fixated throughout session on impression that MDs are ordering unneccessary tests in order to make more $.   Jamal Maes  to explain the recommendations made are best practice standards and to assist with determing reason for CVA, but pt immediately cuts therapist off insisting he knows what he needs, and his heart is fine.   He reports that he plans to begin traveling in RV soon (however, at end of session, he asked about location of OP therapy services, and indicated, he planned to follow up with OP at discharge).   Encouraged him to discuss travel plans with MD to ensure his safety, however, he states "I will go when I want to go".  Pt instructed on BEFAST, but unsure he fully comprehended what was discussed as he kept redirecting conversation back to his travel plans.       Pertinent Vitals/ Pain       Pain Assessment: No/denies pain  Home Living                                          Prior Functioning/Environment               Frequency  Min 2X/week        Progress Toward Goals  OT Goals(current goals can now be found in the care plan section)  Progress towards OT goals: Progressing toward goals     Plan Discharge plan remains appropriate    Co-evaluation                 AM-PAC PT "6 Clicks" Daily Activity     Outcome Measure   Help from another person eating meals?: None Help from another person taking care of personal grooming?: A Little Help from another person toileting, which includes using toliet, bedpan, or urinal?: A Little Help from another person bathing (including washing, rinsing, drying)?: A Little Help from another person to put on and taking off regular upper body clothing?: A Little Help from another person to put on and taking off regular lower body clothing?: A Little 6 Click Score: 19    End of Session    OT Visit Diagnosis: Unsteadiness on feet (R26.81);Cognitive communication deficit (R41.841) Symptoms and signs involving cognitive functions: Cerebral infarction   Activity Tolerance Patient tolerated treatment well   Patient Left in bed;with call bell/phone within reach;with bed alarm set   Nurse Communication Mobility status        Time: 0263-7858 OT Time Calculation (min): 34 min  Charges: OT General Charges $OT Visit: 1 Procedure OT Treatments $Therapeutic Activity: 23-37 mins  [employer], OTR/L [phone]    Jeani Hawking M 03/06/2017, 2:04 PM

## 2017-03-06 NOTE — H&P (View-Only) (Signed)
ELECTROPHYSIOLOGY CONSULT NOTE  Patient ID: Ralph Short MRN: [id number], DOB/AGE: 06/12/1946   Admit date: 03/03/2017 Date of Consult: 03/06/2017  Primary Physician: No PCP Per Patient Primary Cardiologist: Dr. Allyson Sabal (2014) Reason for Consultation: Cryptogenic stroke ; recommendations regarding Implantable Loop Recorder  History of Present Illness Ralph Short was admitted on 03/03/2017 with acute CVA.  They first developed symptoms of difficult speech.  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  PMHx includes HTN, DM, CAD, 2014 had STEMI associated with low EF and LV thrombus treated with coumadin (though no longer on it per the patient), appears he was followed going forward at the [place], CRI, and a traumatic ICH.   he has undergone workup for stroke including echocardiogram and carotid angio.  The patient has been monitored on telemetry which has demonstrated sinus rhythm with no arrhythmias.  Inpatient stroke work-up is to be completed with a TEE.   Echocardiogram this admission demonstrated   Study Conclusions - Left ventricle: No mural apical thrombus seen after definity use.   Septal apical and inferior wall hypokinesis. The cavity size was   severely dilated. Wall thickness was normal. Systolic function   was severely reduced. The estimated ejection fraction was in the   range of 25% to 30%. Doppler parameters are consistent with   abnormal left ventricular relaxation (grade 1 diastolic   dysfunction). - Left atrium: The atrium was moderately dilated. - Atrial septum: No defect or patent foramen ovale was identified.   Lab work is reviewed.  Prior to admission, the patient denies chest pain, shortness of breath, dizziness, palpitations, or syncope.  They are recovering from their stroke with disposition pending at discharge.  Ralph Short is a 71 y.o. male who is being seen today for the evaluation of ILR at the request  of Dr. Pearlean Brownie. .     Past Medical History:  Diagnosis Date  . CAD S/P percutaneous coronary angioplasty - PCI LAD LAD (Xience DES) to prox LAD and mid LAD 07/05/13 07/05/2013  . CKD (chronic kidney disease) stage 3, GFR 30-59 ml/min 07/06/2013  . Diabetes mellitus without complication (HCC)   . Diverticulitis   . Diverticulitis   . DM (diabetes mellitus), type 2, uncontrolled, recently began insulin 07/05/2013  . Gout 07/05/2013  . History of agent Orange exposure 07/05/2013  . Hypertension   . LV dysfunction, s/p MI, 07/05/13 07/05/2013  . Presence of drug coated stent in LAD coronary artery 07/05/2013   2 Xience Xpedition DES to prox & mid LAD -- in STEMI   . STEMI (ST elevation myocardial infarction)of ANT wall-total occ. of LAD  07/05/2013     Surgical History:  Past Surgical History:  Procedure Laterality Date  . COLON SURGERY    . CORONARY ANGIOPLASTY WITH STENT PLACEMENT  07/05/13   emergency PCI and stenting in the setting of anterior STEMI a total proximal LAD with excellent angiographic result and a door to balloon time of 25 minutes.   Marland Kitchen KNEE SURGERY    . LEFT HEART CATHETERIZATION WITH CORONARY ANGIOGRAM N/A 07/05/2013   Procedure: LEFT HEART CATHETERIZATION WITH CORONARY ANGIOGRAM;  Surgeon: Runell Gess, MD;  Location: [facility] CATH LAB;  Service: Cardiovascular;  Laterality: N/A;     Prescriptions Prior to Admission  Medication Sig Dispense Refill Last Dose  . allopurinol (ZYLOPRIM) 100 MG tablet Take 100 mg by mouth daily.    03/02/2017 at Unknown time  . ARTIFICIAL TEAR  OP Place 1 drop into both eyes at bedtime.   03/02/2017 at Unknown time  . atorvastatin (LIPITOR) 80 MG tablet Take 1 tablet (80 mg total) by mouth daily at 6 PM. 30 tablet 5 03/02/2017 at Unknown time  . carvedilol (COREG) 6.25 MG tablet Take 6.25 mg by mouth 2 (two) times daily with a meal.   03/02/2017 at 1900  . clopidogrel (PLAVIX) 75 MG tablet Take 1 tablet (75 mg total) by mouth daily. 90 tablet 3 03/02/2017 at 0600    . clotrimazole (LOTRIMIN) 1 % cream Apply 1 application topically as needed (for skin).   Past Week at Unknown time  . glipiZIDE (GLUCOTROL) 10 MG tablet Take 10 mg by mouth 2 (two) times daily before a meal.   03/02/2017 at Unknown time  . insulin glargine (LANTUS) 100 UNIT/ML injection Inject 50 Units into the skin at bedtime.    03/02/2017 at Unknown time  . meclizine (ANTIVERT) 25 MG tablet Take 1 tablet (25 mg total) by mouth 3 (three) times daily as needed for dizziness. 20 tablet 0 PRN  . nitroGLYCERIN (NITROSTAT) 0.4 MG SL tablet Place 1 tablet (0.4 mg total) under the tongue every 5 (five) minutes as needed for chest pain. 25 tablet 2 PRN  . potassium chloride SA (K-DUR,KLOR-CON) 20 MEQ tablet Take 1 tablet (20 mEq total) by mouth daily. 30 tablet 5 03/02/2017 at Unknown time    Inpatient Medications:  . atorvastatin  80 mg Oral q1800  . carvedilol  3.125 mg Oral BID WC  . ibuprofen  400 mg Oral Once  . insulin aspart  0-9 Units Subcutaneous TID WC  . insulin glargine  15 Units Subcutaneous QHS  . potassium chloride SA  20 mEq Oral Daily    Allergies:  Allergies  Allergen Reactions  . Penicillins Hives  . Aspirin Palpitations    Social History   Social History  . Marital status: Single    Spouse name: N/A  . Number of children: N/A  . Years of education: N/A   Occupational History  . Not on file.   Social History Main Topics  . Smoking status: Former Smoker    Quit date: 11/03/1992  . Smokeless tobacco: Never Used  . Alcohol use No  . Drug use: No  . Sexual activity: Not on file   Other Topics Concern  . Not on file   Social History Narrative  . No narrative on file     Family History  Problem Relation Age of Onset  . Alzheimer's disease Mother   . Hyperlipidemia Mother   . Hypertension Mother   . Diabetes type II Mother   . Hypertension Father   . Alzheimer's disease Father   . Diabetes type II Father   . Diabetes type II Sister   . Hypertension  Sister   . Heart disease Sister   . Heart disease Brother   . Hypertension Brother   . Diabetes type II Brother   . Diabetes type II Sister   . Hypertension Sister   . Diabetes type II Sister   . Hypertension Sister   . Heart disease Sister   . Diabetes type II Sister   . Hypertension Sister   . Diabetes type II Sister   . Hypertension Sister   . Diabetes type II Sister   . Hypertension Sister   . Hypertension Brother   . Diabetes type II Brother   . Hypertension Brother   . Diabetes type II Brother   .  Spina bifida Son       Review of Systems: All other systems reviewed and are otherwise negative except as noted above.  Physical Exam: Vitals:   03/05/17 2149 03/06/17 0059 03/06/17 0601 03/06/17 1057  BP: 118/67 128/70 134/67 128/77  Pulse: 91 86 81 87  Resp: 16 17 17 18   Temp: 98.6 F (37 C) 98.8 F (37.1 C) 98.7 F (37.1 C) 98.9 F (37.2 C)  TempSrc: Oral Oral Oral Oral  SpO2: 97% 98% 98% 96%  Weight:      Height:        The patient is examined by Dr. Rance Muir- The patient is well appearing, alert and oriented x 3 today.   Head- normocephalic, atraumatic Eyes-  Sclera clear, conjunctiva pink Ears- hearing intact Oropharynx- clear Neck- supple Lungs- CTA b/l, normal work of breathing Heart- RRR, no murmurs, rubs or gallops  GI- soft, NT, ND Extremities- no clubbing, cyanosis, or edema MS- no significant deformity or atrophy Skin- no rash or lesion Psych- euthymic mood, full affect   Labs:   Lab Results  Component Value Date   WBC 6.2 03/06/2017   HGB 13.1 03/06/2017   HCT 40.7 03/06/2017   MCV 94.2 03/06/2017   PLT 173 03/06/2017    Recent Labs Lab 03/03/17 0058  03/06/17 0435  NA 140  < > 138  K 4.2  < > 3.9  CL 110  < > 107  CO2 22  < > 23  BUN 13  < > 17  CREATININE 1.46*  < > 1.61*  CALCIUM 8.9  < > 8.6*  PROT 7.0  --   --   BILITOT 0.5  --   --   ALKPHOS 132*  --   --   ALT 22  --   --   AST 26  --   --   GLUCOSE 286*  < >  138*  < > = values in this interval not displayed. Lab Results  Component Value Date   CKTOTAL 6,829 (H) 07/05/2013   CKMB 239.7 (HH) 07/05/2013   TROPONINI <0.03 03/03/2017   Lab Results  Component Value Date   CHOL 98 03/04/2017   CHOL 137 07/06/2013   Lab Results  Component Value Date   HDL 27 (L) 03/04/2017   HDL 29 (L) 07/06/2013   Lab Results  Component Value Date   LDLCALC 56 03/04/2017   LDLCALC 81 07/06/2013   Lab Results  Component Value Date   TRIG 73 03/04/2017   TRIG 134 07/06/2013   Lab Results  Component Value Date   CHOLHDL 3.6 03/04/2017   CHOLHDL 4.7 07/06/2013   No results found for: LDLDIRECT  No results found for: DDIMER   Radiology/Studies:  Mr Maxine Glenn Neck W Wo Contrast Result Date: 03/04/2017 CLINICAL DATA:  71 y/o  M; stroke. EXAM: MRA NECK WITHOUT AND WITH CONTRAST TECHNIQUE: Multiplanar and multiecho pulse sequences of the neck were obtained without and with intravenous contrast. Angiographic images of the neck were obtained using MRA technique without and with intravenous contrast. CONTRAST:  20mL MULTIHANCE GADOBENATE DIMEGLUMINE 529 MG/ML IV SOLN COMPARISON:  03/03/2017 MRI and MRA of the head. FINDINGS: Aortic arch: Patent.  Bovine arch, normal variant. Right common carotid artery: Patent. Right internal carotid artery: Patent. Right vertebral artery: Patent.  Right dominant. Left common carotid artery: Patent. Left Internal carotid artery: Patent. Left Vertebral artery: The left vertebral artery origin is poorly visualized, probably artifactual. Visible vessel is widely patent. There is  no evidence of high-grade stenosis, dissection, or aneurysm unless noted above. IMPRESSION: 1. Left vertebral artery origin is poorly visualized, probably artifactual. 2. Carotid and vertebral arteries are otherwise widely patent without evidence for stenosis, dissection, or aneurysm. Electronically Signed   By: Mitzi Hansen M.D.   On: 03/04/2017 22:29     Mr Brain Wo Contrast Result Date: 03/03/2017 CLINICAL DATA:  Stroke.  Dysphasia. EXAM: MRI HEAD WITHOUT CONTRAST MRA HEAD WITHOUT CONTRAST TECHNIQUE: Multiplanar, multiecho pulse sequences of the brain and surrounding structures were obtained without intravenous contrast. Angiographic images of the head were obtained using MRA technique without contrast. COMPARISON:  Head CT from earlier today FINDINGS: MRI HEAD FINDINGS Incomplete study, T1 and coronal T2 weighted imaging was not acquired. Brain: Moderate confluent area of acute cortically based infarction in the posterior left frontal lobe, reaching the anterior parietal cortex. No hemorrhage, hydrocephalus, or mass. Gliosis in the right frontal lobe, likely post ischemic. Microvascular ischemic change in the cerebral white matter is minimal. Vascular: Arterial findings below. Normal dural venous sinus flow voids. Skull and upper cervical spine: Negative Sinuses/Orbits: Negative MRA HEAD FINDINGS Symmetric carotid and vertebral arteries. No unusual branching pattern. No major branch occlusion, proximal stenosis, or vessel beading. Negative for aneurysm. Neck MRA was requested but could not be completed due to patient intolerance of the scanner. IMPRESSION: 1. Moderate acute infarct in the posterior left frontal lobe, nonhemorrhagic. 2. Negative intracranial MRA. 3. Moderate gliosis in the right frontal pole, likely post ischemic. 4. Incomplete study, including deferred neck MRA. Electronically Signed   By: Marnee Spring M.D.   On: 03/03/2017 20:36   Mr Maxine Glenn Headm Result Date: 03/03/2017 CLINICAL DATA:  Stroke.  Dysphasia. EXAM: MRI HEAD WITHOUT CONTRAST MRA HEAD WITHOUT CONTRAST TECHNIQUE: Multiplanar, multiecho pulse sequences of the brain and surrounding structures were obtained without intravenous contrast. Angiographic images of the head were obtained using MRA technique without contrast. COMPARISON:  Head CT from earlier today FINDINGS: MRI HEAD  FINDINGS Incomplete study, T1 and coronal T2 weighted imaging was not acquired. Brain: Moderate confluent area of acute cortically based infarction in the posterior left frontal lobe, reaching the anterior parietal cortex. No hemorrhage, hydrocephalus, or mass. Gliosis in the right frontal lobe, likely post ischemic. Microvascular ischemic change in the cerebral white matter is minimal. Vascular: Arterial findings below. Normal dural venous sinus flow voids. Skull and upper cervical spine: Negative Sinuses/Orbits: Negative MRA HEAD FINDINGS Symmetric carotid and vertebral arteries. No unusual branching pattern. No major branch occlusion, proximal stenosis, or vessel beading. Negative for aneurysm. Neck MRA was requested but could not be completed due to patient intolerance of the scanner. IMPRESSION: 1. Moderate acute infarct in the posterior left frontal lobe, nonhemorrhagic. 2. Negative intracranial MRA. 3. Moderate gliosis in the right frontal pole, likely post ischemic. 4. Incomplete study, including deferred neck MRA. Electronically Signed   By: Marnee Spring M.D.   On: 03/03/2017 20:36   Ct Head Code Stroke W/o Cm Result Date: 03/03/2017 CLINICAL DATA:  Code stroke. Initial evaluation for acute right-sided weakness. EXAM: CT HEAD WITHOUT CONTRAST TECHNIQUE: Contiguous axial images were obtained from the base of the skull through the vertex without intravenous contrast. COMPARISON:  Prior CT from 03/08/2015. FINDINGS: Brain: Generalized age-related cerebral atrophy. Chronic encephalomalacia within the anterior inferior right frontal lobe, unchanged. Small remote left occipital lobe infarct noted as well. No acute intracranial hemorrhage. No evidence for acute large vessel territory infarct. No mass lesion, midline shift or mass effect. No hydrocephalus.  No extra-axial fluid collection. Vascular: No asymmetric hyperdense vessel. Scattered vascular calcifications noted within the carotid siphons. Skull: Scalp  soft tissues demonstrate no acute abnormality. Calvarium intact. Sinuses/Orbits: Globes and orbital soft tissues within normal limits. Paranasal sinuses are clear. No mastoid effusion. ASPECTS [facility] Stroke Program Early CT Score) - Ganglionic level infarction (caudate, lentiform nuclei, internal capsule, insula, M1-M3 cortex): 7 - Supraganglionic infarction (M4-M6 cortex): 3 Total score (0-10 with 10 being normal): 10 IMPRESSION: 1. No acute intracranial infarct or other process identified. 2. ASPECTS is 10 3. Encephalomalacia within the anterior right frontal lobe, stable. 4. Small remote left occipital lobe infarct, also unchanged. Critical Value/emergent results were called by telephone at the time of interpretation on 03/03/2017 at 1:37 am to Dr. Cy Blamer , who verbally acknowledged these results. Electronically Signed   By: Rise Mu M.D.   On: 03/03/2017 01:42    12-lead ECG SR only All prior EKG's in EPIC reviewed with no documented atrial fibrillation Telemetry SR only  Assessment and Plan:  1. Cryptogenic stroke The patient presents with cryptogenic stroke.  The patient has a TEE planned for this AM.  I spoke at length with the patient about monitoring for afib with either a 30 day event monitor or an implantable loop recorder.  Risks, benefits, and alteratives to implantable loop recorder were discussed with the patient today.   At this time, the patient is very clear in his decision that he does not want to pursue any kind of heart monitoring (discussed as well ability to monitor for V arrhythmias given his CM) but he is firm that he Ralph Short see his cardiologist at the [facility] and make a decision with them.  He does not want [place] to follow out patient.  Please call with questions.   Sheilah Pigeon, PA-C 03/06/2017  I have seen and examined this patient with Francis Dowse.  Agree with above, note added to reflect my findings.  On exam, RRR, no murmurs, lungs clear.  Patient presented  with speech difficulty found to have a left MCA stroke. TTE shows a low EF which is similar to prior. TEE scheduled. Discussed 30 day monitor with the patient for monitoring of AF prior to possible ICD implant in the future. The patient has declined 30 day monitor. He wishes to follow up with his outpatient cardiologist.  Athel Merriweather M. Broden Holt MD 03/06/2017 12:59 PM

## 2017-03-06 NOTE — Care Management Note (Signed)
Case Management Note  Patient Details  Name: Ralph Short MRN: [id number] Date of Birth: [birth year]  Subjective/Objective:                    Action/Plan: Plan is for patient to d/c home with self care. CM consulted for outpatient therapy. CM met with the patient and he was interested in [facility]. Orders in EPIC and information on the AVS.   Expected Discharge Date:                  Expected Discharge Plan:  Home/Self Care  In-House Referral:     Discharge planning Services  CM Consult  Post Acute Care Choice:    Choice offered to:     DME Arranged:    DME Agency:     HH Arranged:    [place] Agency:     Status of Service:  Completed, signed off  If discussed at [employer] of Stay Meetings, dates discussed:    Additional Comments:  Pollie Friar, RN 03/06/2017, 4:09 PM

## 2017-03-06 NOTE — Interval H&P Note (Signed)
History and Physical Interval Note:  03/06/2017 1:35 PM  Ralph Short  has presented today for surgery, with the diagnosis of STROKE  The various methods of treatment have been discussed with the patient and family. After consideration of risks, benefits and other options for treatment, the patient has consented to  Procedure(s): TRANSESOPHAGEAL ECHOCARDIOGRAM (TEE) (N/A) as a surgical intervention .  The patient's history has been reviewed, patient examined, no change in status, stable for surgery.  I have reviewed the patient's chart and labs.  Questions were answered to the patient's satisfaction.     Tobias Alexander

## 2017-03-06 NOTE — Progress Notes (Signed)
ANTICOAGULATION CONSULT NOTE - Follow Up Consult  Pharmacy Consult for heparin Indication: LV thrombus, recent CVA  Allergies  Allergen Reactions  . Penicillins Hives  . Aspirin Palpitations    Patient Measurements: Height: 5\' 10"  (177.8 cm) Weight: 257 lb (116.6 kg) IBW/kg (Calculated) : 73 Heparin Dosing Weight: 98.5 kg  Labs:  Recent Labs  03/04/17 0638  03/04/17 2200 03/05/17 0334 03/06/17 0435  HGB 13.4  --   --  13.7 13.1  HCT 41.4  --   --  41.8 40.7  PLT 171  --   --  179 173  LABPROT 14.0  --   --   --   --   INR 1.07  --   --   --   --   HEPARINUNFRC 0.58  < > 0.41 0.34 0.54  CREATININE  --   --   --  1.46* 1.61*  < > = values in this interval not displayed.  Estimated Creatinine Clearance: 54.6 mL/min (A) (by C-G formula based on SCr of 1.61 mg/dL (H)).   Medications:  Infusions:  . heparin 1,150 Units/hr (03/05/17 1010)    Assessment: 70 yom on warfarin PTA (appears to have been d/c'd at some point) for LV thrombus admitted with ischemic stroke. Pharmacy consulted to dose heparin (lower goal, no bolus in setting of stroke). Warfarin d/c'd for now per MD.  Heparin level this AM slightly high at 0.54 for lower goal. CBC wnl. No bleed documented.  Goal of Therapy:  Heparin level 0.3-0.5 units/ml Monitor platelets by anticoagulation protocol: Yes   Plan:  Decrease IV heparin to 1050 units/h 8h heparin level Daily heparin level and CBC Monitor for s/sx bleeding Holding warfarin for now per discussion with Dr. Antony Haste, PharmD, BCPS Clinical Pharmacist 03/06/2017 9:24 AM

## 2017-03-07 LAB — BASIC METABOLIC PANEL
Anion gap: 10 (ref 5–15)
BUN: 17 mg/dL (ref 6–20)
CO2: 25 mmol/L (ref 22–32)
Calcium: 8.6 mg/dL — ABNORMAL LOW (ref 8.9–10.3)
Chloride: 103 mmol/L (ref 101–111)
Creatinine, Ser: 1.54 mg/dL — ABNORMAL HIGH (ref 0.61–1.24)
GFR calc Af Amer: 51 mL/min — ABNORMAL LOW (ref >60)
GFR calc non Af Amer: 44 mL/min — ABNORMAL LOW (ref >60)
Glucose, Bld: 219 mg/dL — ABNORMAL HIGH (ref 65–99)
Potassium: 4.1 mmol/L (ref 3.5–5.1)
Sodium: 138 mmol/L (ref 135–145)

## 2017-03-07 LAB — GLUCOSE, CAPILLARY
GLUCOSE-CAPILLARY: 255 mg/dL — AB (ref 65–99)
Glucose-Capillary: 170 mg/dL — ABNORMAL HIGH (ref 65–99)

## 2017-03-07 LAB — CBC
HEMATOCRIT: 40.9 % (ref 39.0–52.0)
Hemoglobin: 13.2 g/dL (ref 13.0–17.0)
MCH: 30.6 pg (ref 26.0–34.0)
MCHC: 32.3 g/dL (ref 30.0–36.0)
MCV: 94.9 fL (ref 78.0–100.0)
PLATELETS: 157 10*3/uL (ref 150–400)
RBC: 4.31 MIL/uL (ref 4.22–5.81)
RDW: 14.1 % (ref 11.5–15.5)
WBC: 5.8 10*3/uL (ref 4.0–10.5)

## 2017-03-07 NOTE — Progress Notes (Signed)
Pt discharged to home. Left the unit via wc. Discharge education completed. Pt stated understanding of follow up appointment and medication list post discharge.

## 2017-03-07 NOTE — Discharge Summary (Signed)
[facility]  Physician Discharge Summary   Patient ID: Ralph Short MRN: [id number] DOB/AGE: [birth year] 71 y.o.  Admit date: 03/03/2017 Discharge date: 03/07/2017  PCP: Dr. 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 Patient instructed to follow-up with his providers at the [facility] in a week 2. Patient to consider loop recorder placement versus Holter monitor after discussing with his cardiologist at the [facility]  DISCHARGE CONDITION: fair  Diet recommendation: Modified carbohydrate  Filed Weights   03/03/17 0451 03/05/17 1320  Weight: 115.3 kg (254 lb 3.2 oz) 116.6 kg (257 lb)    INITIAL HISTORY: 71 year old male with a past medical history of coronary artery disease, treated previously with a drug-eluting stent to unknown vessels, history of LV thrombus, who was on warfarin treatment, history of systolic CHF with ejection fraction of 25-30%, history of traumatic intracranial hemorrhage, history of diabetes mellitus type 2, hyperlipidemia and chronic disease, presented with the right-sided weakness and speech impairment. Patient was hospitalized. He was seen by neurology. MRI revealed stroke.   Consultants: Neurology. Electrophysiologist  Procedures:   Transthoracic echocardiogram Study Conclusions  - Left ventricle: No mural apical thrombus seen after definity use. Septal apical and inferior wall hypokinesis. The cavity size was severely dilated. Wall  thickness was normal. Systolic function was severely reduced. The estimated ejection fraction was in the range of 25% to 30%. Doppler parameters are consistent with abnormal left ventricular relaxation (grade 1 diastolic dysfunction). - Left atrium: The atrium was moderately dilated. - Atrial septum: No defect or patent foramen ovale was identified.  TEE Left ventricle: No mural apical thrombus seen. The cavity size was severely dilated. Wall thickness was normal. Systolic function was severely reduced. The estimated ejection fraction was in the range of 25% to 30%.  - Left atrium: The atrium was moderately dilated. - Atrial septum: No defect or patent foramen ovale was identified with Doppler and bubble study. - No thrombus in LA or LAA   HOSPITAL COURSE:   Acute ischemic stroke with right sided weakness Acute stroke was noted on MRI brain. Neurology was consulted. Concern was for embolic stroke. Stroke workup was initiated. Patient's symptoms totally started improving. His speech improved. His right-sided weakness resolved. Patient underwent echocardiogram as above. Diminished ejection fraction was noted, which has been known previously. Patient underwent TEE which did not show any clot. Patient was seen by electrophysiology, to discuss placement of loop recorder. Despite discussions with cardiology as well as neurology, patient has declined placement of loop recorder or Holter monitor. He wishes to follow-up with his own cardiologist at the [place]. Patient was kept on IV heparin until the TEE was completed. He does not need to be on anticoagulation per neurology. Patient was on warfarin previously for LV thrombus, but not on it currently. LDL 56. He is on a statin. He may continue taking his Plavix. This was all discussed with neurology, Dr. Pearlean Brownie.  Previous History of LV thrombus Patient has previous history of  same. Patient has not been on warfarin. He was on Plavix. No clot  identified on TEE. No need for anticoagulation at this time.   History of CAD with DES placement in 2014 Seems to be stable from a cardiac perspective. Continue home medications.   Chronic systolic CHF Ejection fraction noted to be 25-30%. Similar to what was seen back in 2014. He is followed by cardiology at the [facility]. Seems to be well compensated at this time. Continue Coreg. Not noted to be on ACE inhibitor, possibly due to CKD.  Essential hypertension  Continue home medications  History of Hyperlipidemia LDL is 56. Continue statin.   Diabetes mellitus type 2  HbA1c is 8.7. Continue home medications  Chronic kidney disease, stage 3 Stable.  History of Gout Stable. Continue allopurinol.   Okay for discharge home. Patient knows that he is to follow-up with his providers at the [place] within 1 week.   PERTINENT LABS:  The results of significant diagnostics from this hospitalization (including imaging, microbiology, ancillary and laboratory) are listed below for reference.     Labs: Basic Metabolic Panel:  Recent Labs Lab 03/03/17 0058 03/05/17 0334 03/06/17 0435 03/06/17 1657 03/07/17 0345  NA 140 138 138 139 138  K 4.2 4.1 3.9 4.4 4.1  CL 110 104 107 105 103  CO2 22 25 23 25 25   GLUCOSE 286* 211* 138* 241* 219*  BUN 13 14 17 17 17   CREATININE 1.46* 1.46* 1.61* 1.56* 1.54*  CALCIUM 8.9 8.8* 8.6* 8.7* 8.6*   Liver Function Tests:  Recent Labs Lab 03/03/17 0058  AST 26  ALT 22  ALKPHOS 132*  BILITOT 0.5  PROT 7.0  ALBUMIN 3.8   CBC:  Recent Labs Lab 03/03/17 0058 03/04/17 0638 03/05/17 0334 03/06/17 0435 03/06/17 1657 03/07/17 0345  WBC 5.4 5.7 6.6 6.2 4.7 5.8  NEUTROABS 2.5  --   --   --   --   --   HGB 13.8 13.4 13.7 13.1 13.4 13.2  HCT 41.8 41.4 41.8 40.7 42.8 40.9  MCV 95.9 94.5 94.6 94.2 96.0 94.9  PLT 167 171 179 173 162 157   Cardiac Enzymes:  Recent Labs Lab 03/03/17 0058  TROPONINI <0.03    CBG:  Recent Labs Lab  03/06/17 1105 03/06/17 1613 03/06/17 2119 03/07/17 0640 03/07/17 1230  GLUCAP 151* 180* 231* 170* 255*     IMAGING STUDIES Mr Maxine Glenn Neck W Wo Contrast  Result Date: 03/04/2017 CLINICAL DATA:  71 y/o  M; stroke. EXAM: MRA NECK WITHOUT AND WITH CONTRAST TECHNIQUE: Multiplanar and multiecho pulse sequences of the neck were obtained without and with intravenous contrast. Angiographic images of the neck were obtained using MRA technique without and with intravenous contrast. CONTRAST:  14mL MULTIHANCE GADOBENATE DIMEGLUMINE 529 MG/ML IV SOLN COMPARISON:  03/03/2017 MRI and MRA of the head. FINDINGS: Aortic arch: Patent.  Bovine arch, normal variant. Right common carotid artery: Patent. Right internal carotid artery: Patent. Right vertebral artery: Patent.  Right dominant. Left common carotid artery: Patent. Left Internal carotid artery: Patent. Left Vertebral artery: The left vertebral artery origin is poorly visualized, probably artifactual. Visible vessel is widely patent. There is no evidence of high-grade stenosis, dissection, or aneurysm unless noted above. IMPRESSION: 1. Left vertebral artery origin is poorly visualized, probably artifactual. 2. Carotid and vertebral arteries are otherwise widely patent without evidence for stenosis, dissection, or aneurysm. Electronically Signed   By: Mitzi Hansen M.D.   On: 03/04/2017 22:29   Mr Brain Wo Contrast  Result Date: 03/03/2017 CLINICAL DATA:  Stroke.  Dysphasia. EXAM: MRI HEAD WITHOUT CONTRAST MRA HEAD WITHOUT CONTRAST TECHNIQUE: Multiplanar, multiecho pulse sequences of the brain and surrounding structures were obtained without intravenous contrast. Angiographic images of the head were obtained using MRA technique without contrast. COMPARISON:  Head CT from earlier today FINDINGS: MRI HEAD FINDINGS Incomplete study, T1 and coronal T2 weighted imaging was not acquired. Brain: Moderate confluent area of acute cortically based infarction in the  posterior left frontal lobe, reaching the anterior parietal cortex. No hemorrhage, hydrocephalus, or mass. Gliosis in the right frontal lobe, likely post ischemic. Microvascular ischemic change in the cerebral white matter is minimal. Vascular: Arterial findings below. Normal dural venous sinus flow voids. Skull and upper cervical spine: Negative Sinuses/Orbits: Negative MRA HEAD FINDINGS Symmetric carotid and vertebral arteries. No unusual branching pattern. No major branch occlusion, proximal stenosis, or vessel beading. Negative for aneurysm. Neck MRA was requested but could not be completed due to patient intolerance of the scanner. IMPRESSION: 1. Moderate acute infarct in the posterior left frontal lobe, nonhemorrhagic. 2. Negative intracranial MRA. 3. Moderate gliosis in the right frontal pole, likely post ischemic. 4. Incomplete study, including deferred neck MRA. Electronically Signed   By: Marnee Spring M.D.   On: 03/03/2017 20:36   Mr Maxine Glenn Headm  Result Date: 03/03/2017 CLINICAL DATA:  Stroke.  Dysphasia. EXAM: MRI HEAD WITHOUT CONTRAST MRA HEAD WITHOUT CONTRAST TECHNIQUE: Multiplanar, multiecho pulse sequences of the brain and surrounding structures were obtained without intravenous contrast. Angiographic images of the head were obtained using MRA technique without contrast. COMPARISON:  Head CT from earlier today FINDINGS: MRI HEAD FINDINGS Incomplete study, T1 and coronal T2 weighted imaging was not acquired. Brain: Moderate confluent area of acute cortically based infarction in the posterior left frontal lobe, reaching the anterior parietal cortex. No hemorrhage, hydrocephalus, or mass. Gliosis in the right frontal lobe, likely post ischemic. Microvascular ischemic change in the cerebral white matter is minimal. Vascular: Arterial findings below. Normal dural venous sinus flow voids. Skull and upper cervical spine: Negative Sinuses/Orbits: Negative MRA HEAD FINDINGS Symmetric carotid and vertebral  arteries. No unusual branching pattern. No major branch occlusion, proximal stenosis, or vessel beading. Negative for aneurysm. Neck MRA was requested but could not be completed due to patient intolerance of the scanner. IMPRESSION: 1. Moderate acute infarct in the posterior left frontal lobe, nonhemorrhagic. 2. Negative intracranial MRA. 3. Moderate gliosis in the right frontal pole, likely post ischemic. 4. Incomplete study, including deferred neck MRA. Electronically Signed   By: Marnee Spring M.D.   On: 03/03/2017 20:36   Ct Head Code Stroke W/o Cm  Result Date: 03/03/2017 CLINICAL DATA:  Code stroke. Initial evaluation for acute right-sided weakness. EXAM: CT HEAD WITHOUT CONTRAST TECHNIQUE: Contiguous axial images were obtained from the base of the skull through the vertex without intravenous contrast. COMPARISON:  Prior CT from 03/08/2015. FINDINGS: Brain: Generalized age-related cerebral atrophy. Chronic encephalomalacia within the anterior inferior right frontal lobe, unchanged. Small remote left occipital lobe infarct noted as well. No acute intracranial hemorrhage. No evidence for acute large vessel territory infarct. No mass lesion, midline shift or mass effect. No hydrocephalus. No extra-axial fluid collection. Vascular: No asymmetric hyperdense vessel. Scattered vascular calcifications noted within the carotid siphons. Skull: Scalp soft tissues demonstrate no acute abnormality. Calvarium intact. Sinuses/Orbits: Globes and orbital soft tissues within normal limits. Paranasal sinuses are clear. No mastoid effusion. ASPECTS [facility] Stroke Program Early CT Score) - Ganglionic level infarction (caudate, lentiform nuclei,  internal capsule, insula, M1-M3 cortex): 7 - Supraganglionic infarction (M4-M6 cortex): 3 Total score (0-10 with 10 being normal): 10 IMPRESSION: 1. No acute intracranial infarct or other process identified. 2. ASPECTS is 10 3. Encephalomalacia within the anterior right frontal lobe,  stable. 4. Small remote left occipital lobe infarct, also unchanged. Critical Value/emergent results were called by telephone at the time of interpretation on 03/03/2017 at 1:37 am to Dr. Cy Blamer , who verbally acknowledged these results. Electronically Signed   By: Rise Mu M.D.   On: 03/03/2017 01:42    DISCHARGE EXAMINATION: Vitals:   03/06/17 2120 03/07/17 0138 03/07/17 0638 03/07/17 1151  BP: 128/74 (!) 96/35 126/69 126/85  Pulse: 95 95 89 91  Resp: 20 20 20 20   Temp: 97.8 F (36.6 C) 98.3 F (36.8 C) 98.5 F (36.9 C) 97.6 F (36.4 C)  TempSrc: Oral Oral Oral Oral  SpO2: 95% 95% 94% 96%  Weight:      Height:       General appearance: alert, cooperative, appears stated age and no distress Resp: clear to auscultation bilaterally Cardio: regular rate and rhythm, S1, S2 normal, no murmur, click, rub or gallop GI: soft, non-tender; bowel sounds normal; no masses,  no organomegaly  DISPOSITION: Home with family  Discharge Instructions    Ambulatory referral to Occupational Therapy    Complete by:  As directed    Ambulatory referral to Physical Therapy    Complete by:  As directed    Ambulatory referral to Speech Therapy    Complete by:  As directed    Call MD for:  extreme fatigue    Complete by:  As directed    Call MD for:  persistant dizziness or light-headedness    Complete by:  As directed    Call MD for:  persistant nausea and vomiting    Complete by:  As directed    Call MD for:  severe uncontrolled pain    Complete by:  As directed    Call MD for:  temperature >100.4    Complete by:  As directed    Diet - low sodium heart healthy    Complete by:  As directed    Diet Carb Modified    Complete by:  As directed    Discharge instructions    Complete by:  As directed    Please follow up with your PCP within 1 week for further evaluation and referral to cardiology for Loop recorder or holter monitoring.  You were cared for by a hospitalist during  your hospital stay. If you have any questions about your discharge medications or the care you received while you were in the hospital after you are discharged, you can call the unit and asked to speak with the hospitalist on call if the hospitalist that took care of you is not available. Once you are discharged, your primary care physician will handle any further medical issues. Please note that NO REFILLS for any discharge medications will be authorized once you are discharged, as it is imperative that you return to your primary care physician (or establish a relationship with a primary care physician if you do not have one) for your aftercare needs so that they can reassess your need for medications and monitor your lab values. If you do not have a primary care physician, you can call [phone] for a physician referral.   Increase activity slowly    Complete by:  As directed  ALLERGIES:  Allergies  Allergen Reactions  . Penicillins Hives  . Aspirin Palpitations     Current Discharge Medication List    CONTINUE these medications which have NOT CHANGED   Details  allopurinol (ZYLOPRIM) 100 MG tablet Take 100 mg by mouth daily.     ARTIFICIAL TEAR OP Place 1 drop into both eyes at bedtime.    atorvastatin (LIPITOR) 80 MG tablet Take 1 tablet (80 mg total) by mouth daily at 6 PM. Qty: 30 tablet, Refills: 5    carvedilol (COREG) 6.25 MG tablet Take 6.25 mg by mouth 2 (two) times daily with a meal.    clopidogrel (PLAVIX) 75 MG tablet Take 1 tablet (75 mg total) by mouth daily. Qty: 90 tablet, Refills: 3    clotrimazole (LOTRIMIN) 1 % cream Apply 1 application topically as needed (for skin).    glipiZIDE (GLUCOTROL) 10 MG tablet Take 10 mg by mouth 2 (two) times daily before a meal.    insulin glargine (LANTUS) 100 UNIT/ML injection Inject 50 Units into the skin at bedtime.     meclizine (ANTIVERT) 25 MG tablet Take 1 tablet (25 mg total) by mouth 3 (three) times daily as needed  for dizziness. Qty: 20 tablet, Refills: 0    nitroGLYCERIN (NITROSTAT) 0.4 MG SL tablet Place 1 tablet (0.4 mg total) under the tongue every 5 (five) minutes as needed for chest pain. Qty: 25 tablet, Refills: 2    potassium chloride SA (K-DUR,KLOR-CON) 20 MEQ tablet Take 1 tablet (20 mEq total) by mouth daily. Qty: 30 tablet, Refills: 5      STOP taking these medications     methocarbamol (ROBAXIN) 750 MG tablet          Follow-up Information    [facility] Follow up.   Specialty:  Rehabilitation Why:  They will contact you for the first appointment. Contact information: [address] [id number] [place] [address] [phone]       Micki Riley, MD. Schedule an appointment as soon as possible for a visit in 4 week(s).   Specialties:  Neurology, Radiology Contact information: [address] [place] [address] [phone]        Genelle Gather, MD. Schedule an appointment as soon as possible for a visit in 1 week(s).   Specialty:  Internal Medicine Contact information: [address] [place] [address] [phone]           TOTAL DISCHARGE TIME: 35 mins  [facility]  [facility] Pager [phone]  03/07/2017, 2:14 PM

## 2017-03-07 NOTE — Discharge Instructions (Signed)
Ischemic Stroke °An ischemic stroke (cerebrovascular accident, or CVA) is the sudden death of brain tissue that occurs when an area of the brain does not get enough oxygen. It is a medical emergency that must be treated right away. An ischemic stroke can cause permanent loss of brain function. This can cause problems with how different parts of your body function. °What are the causes? °This condition is caused by a decrease of oxygen supply to an area of the brain, which may be the result of: °· A small blood clot (embolus) or a buildup of plaque in the blood vessels (atherosclerosis) that blocks blood flow in the brain. °· An abnormal heart rhythm (atrial fibrillation). °· A blocked or damaged artery in the head or neck. °What increases the risk? °Certain factors may make you more likely to develop this condition. Some of these factors are things that you can change, such as: °· Obesity. °· Smoking cigarettes. °· Taking oral birth control, especially if you also use tobacco. °· Physical inactivity. °· Excessive alcohol use. °· Use of illegal drugs, especially cocaine and methamphetamine. °Other risk factors include: °· High blood pressure (hypertension). °· High cholesterol. °· Diabetes mellitus. °· Heart disease. °· Being African American, Native American, Hispanic, or [place] Native. °· Being over age 60. °· Family history of stroke. °· Previous history of blood clots, stroke, or transient ischemic attack (TIA). °· Sickle cell disease. °· Being a woman with a history of preeclampsia. °· Migraine headache. °· Sleep apnea. °· Irregular heartbeats, such as atrial fibrillation. °· Chronic inflammatory diseases, such as rheumatoid arthritis or lupus. °· Blood clotting disorders (hypercoagulable state). °What are the signs or symptoms? °Symptoms of this condition usually develop suddenly, or you may notice them after waking up from sleep. Symptoms may include sudden: °· Weakness or numbness in your face, arm, or leg,  especially on one side of your body. °· Trouble walking or difficulty moving your arms or legs. °· Loss of balance or coordination. °· Confusion. °· Slurred speech (dysarthria). °· Trouble speaking, understanding speech, or both (aphasia). °· Vision changes--such as double vision, blurred vision, or loss of vision--in one or both eyes. °· Dizziness. °· Nausea and vomiting. °· Severe headache with no known cause. The headache is often described as the worst headache ever experienced. °If possible, make note of the exact time that you last felt like your normal self and what time your symptoms started. Tell your health care provider. °If symptoms come and go, this could be a sign of a warning stroke, or TIA. Get help right away, even if you feel better. °How is this diagnosed? °This condition may be diagnosed based on: °· Your symptoms, your medical history, and a physical exam. °· CT scan of the brain. °· MRI. °· CT angiogram. This test uses a computer to take X-rays of your arteries. A dye may be injected into your blood to show the inside of your blood vessels more clearly. °· MRI angiogram. This is a type of MRI that is used to evaluate the blood vessels. °· Cerebral angiogram. This test uses X-rays and a dye to show the blood vessels in the brain and neck. °You may need to see a health care provider who specializes in stroke care. A stroke specialist can be seen in person or through communication using telephone or television technology (telemedicine). °Other tests may also be done to find the cause of the stroke, such as: °· Electrocardiogram (ECG). °· Continuous heart monitoring. °· Echocardiogram. °·   Carotid ultrasound. °· A scan of the brain circulation. °· Blood tests. °· Sleep study to check for sleep apnea. °How is this treated? °Treatment for this condition will depend on the duration, severity, and cause of your symptoms and on the area of the brain affected. It is very important to get treatment at the  first sign of stroke symptoms. Some treatments work better if they are done within 3-6 hours of the onset of stroke symptoms. These initial treatments may include: °· Aspirin. °· Medicines to control blood pressure. °· Medicine given by injection to dissolve the blood clot (thrombolytic). °· Treatments given directly to the affected artery to remove or dissolve the blood clot. °Other treatment options may include: °· Oxygen. °· IV fluids. °· Medicines to thin the blood (anticoagulants or antiplatelets). °· Procedures to increase blood flow. °Medicines and changes to your diet may be used to help treat and manage risk factors for stroke, such as diabetes, high cholesterol, and high blood pressure. °After a stroke, you may work with physical, speech, mental health, or occupational therapists to help you recover. °Follow these instructions at home: °Medicines  °· Take over-the-counter and prescription medicines only as told by your health care provider. °· If you were told to take a medicine to thin your blood, such as aspirin or an anticoagulant, take it exactly as told by your health care provider. °¨ Taking too much blood-thinning medicine can cause bleeding. °¨ If you do not take enough blood-thinning medicine, you will not have the protection that you need against another stroke and other problems. °· Understand the side effects of taking anticoagulant medicine. When taking this type of medicine, make sure you: °¨ Hold pressure over any cuts for longer than usual. °¨ Tell your dentist and other health care providers that you are taking anticoagulants before you have any procedures that may cause bleeding. °¨ Avoid activities that may cause trauma or injury. °Eating and drinking  °· Follow instructions from your health care provider about diet. °· Eat healthy foods. °· If your ability to swallow was affected by the stroke, you may need to take steps to avoid choking, such as: °¨ Taking small bites when  eating. °¨ Eating foods that are soft or pureed. °Safety  °· Follow instructions from your health care team about physical activity. °· Use a walker or cane as told by your health care provider. °· Take steps to create a safe home environment in order to reduce the risk of falls. This may include: °¨ Having your home looked at by specialists. °¨ Installing grab bars in the bedroom and bathroom. °¨ Using safety equipment, such as raised toilets and a seat in the shower. °General instructions  °· Do not use any tobacco products, such as cigarettes, chewing tobacco, and e-cigarettes. If you need help quitting, ask your health care provider. °· Limit alcohol intake to no more than 1 drink a day for nonpregnant women and 2 drinks a day for men. One drink equals 12 oz of beer, 5 oz of wine, or 1½ oz of hard liquor. °· If you need help to stop using drugs or alcohol, ask your health care provider about a referral to a program or specialist. °· Maintain an active and healthy lifestyle. Get regular exercise as told by your health care provider. °· Keep all follow-up visits as told by your health care provider, including visits with all specialists on your health care team. This is important. °How is this prevented? °Your   risk of another stroke can be decreased by managing high blood pressure, high cholesterol, diabetes, heart disease, sleep apnea, and obesity. It can also be decreased by quitting smoking, limiting alcohol, and staying physically active. °Your health care provider will continue to work with you on measures to prevent short-term and long-term complications of stroke. °Get help right away if: °You have: °· Sudden weakness or numbness in your face, arm, or leg, especially on one side of your body. °· Sudden confusion. °· Sudden trouble speaking, understanding, or both (aphasia). °· Sudden trouble seeing with one or both eyes. °· Sudden trouble walking or difficulty moving your arms or legs. °· Sudden  dizziness. °· Sudden loss of balance or coordination. °· Sudden, severe headache with no known cause. °· A partial or total loss of consciousness. °· A seizure. °Any of these symptoms may represent a serious problem that is an emergency. Do not wait to see if the symptoms will go away. Get medical help right away. Call your local emergency services (911 in [place] Do not drive yourself to the hospital. °This information is not intended to replace advice given to you by your health care provider. Make sure you discuss any questions you have with your health care provider. °Document Released: 10/20/2005 Document Revised: 04/01/2016 Document Reviewed: 01/16/2016 °Elsevier Interactive Patient Education © 2017 [employer] ° °

## 2017-03-09 ENCOUNTER — Encounter (HOSPITAL_COMMUNITY): Payer: Self-pay | Admitting: Cardiology

## 2017-03-19 ENCOUNTER — Ambulatory Visit: Payer: Non-veteran care

## 2018-09-30 IMAGING — MR MR MRA NECK WO/W CM
4 of 7 series · 19 of 48 positions shown · IV contrast (multihance)
Comparison: 03/03/2017 MRI and MRA of the head.

CLINICAL DATA: 70 y/o  M; stroke.

EXAM:
MRA NECK WITHOUT AND WITH CONTRAST
TECHNIQUE: Multiplanar and multiecho pulse sequences of the neck were obtained
without and with intravenous contrast. Angiographic images of the
neck were obtained using MRA technique without and with intravenous
contrast.
CONTRAST:  20mL MULTIHANCE GADOBENATE DIMEGLUMINE 529 MG/ML IV SOLN

[Series 3: ax (id) · axial · 2.8mm · 0.47mm/px · z∈[-155,-9]mm · 8 of 108 slices shown (1 of 2)]
[im 1/108]
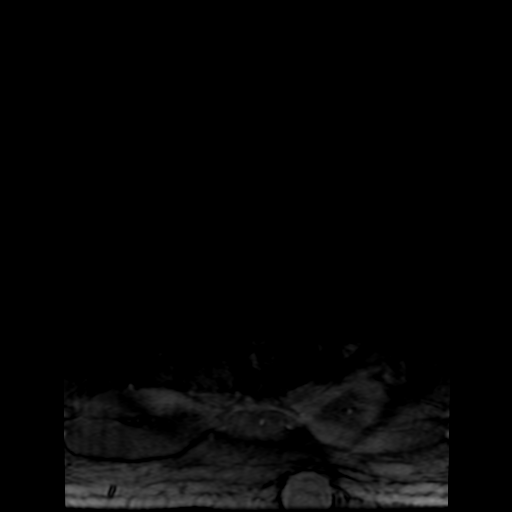
[im 16/108]
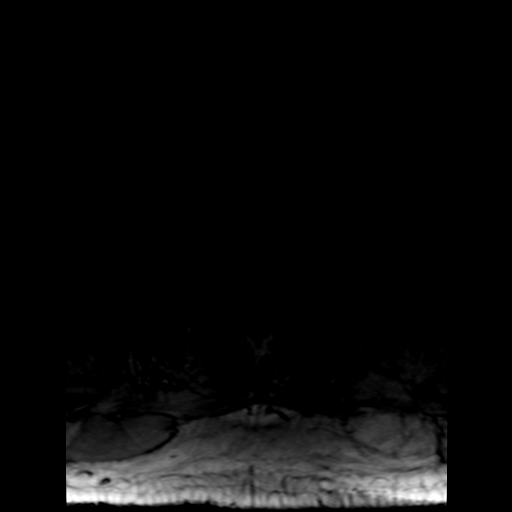
[im 31/108]
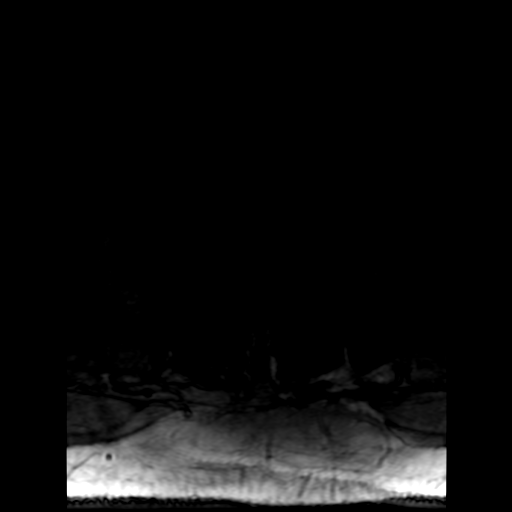
[im 46/108]
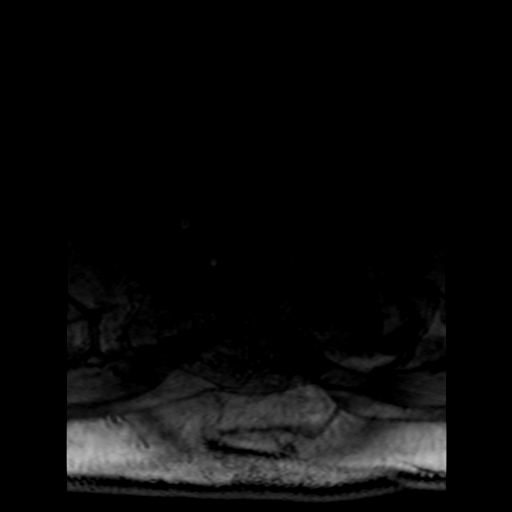
[im 62/108]
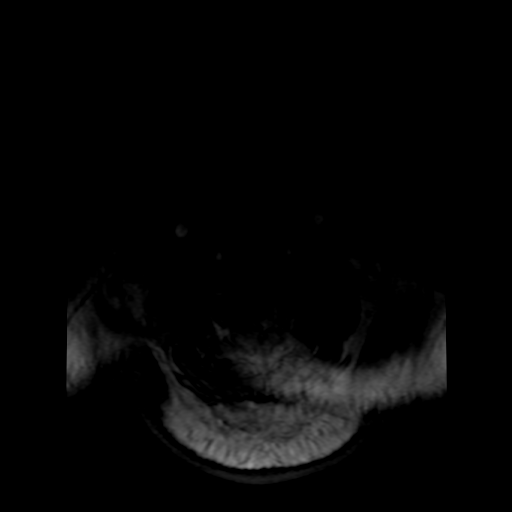
[im 77/108]
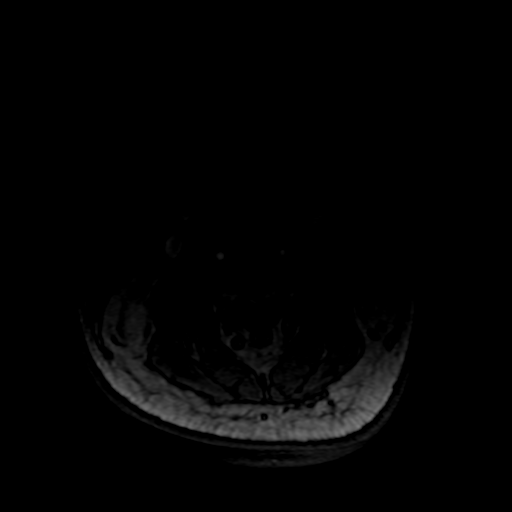
[im 92/108]
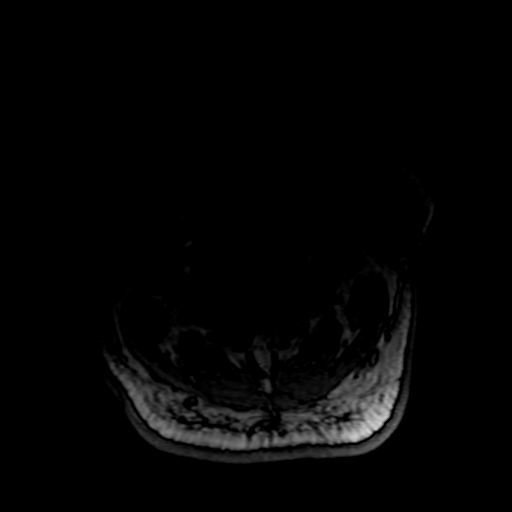
[im 108/108]
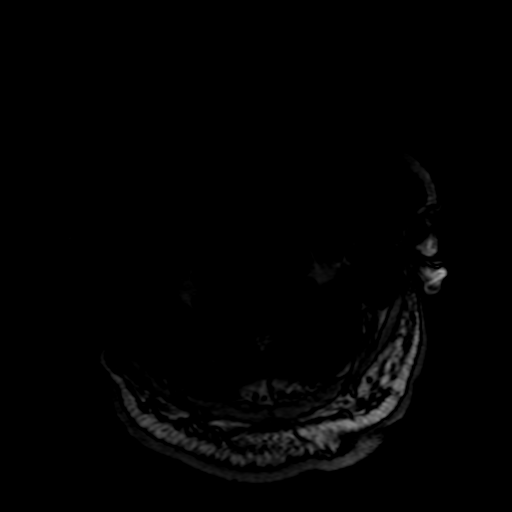

[Series 4: ax (id) · axial · 2.8mm · 0.47mm/px · z∈[-123,-31]mm · 5 of 68 slices shown (2 of 2)]
[im 1/68]
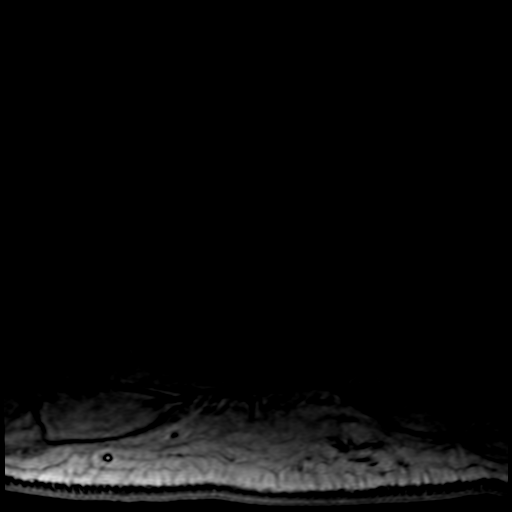
[im 17/68]
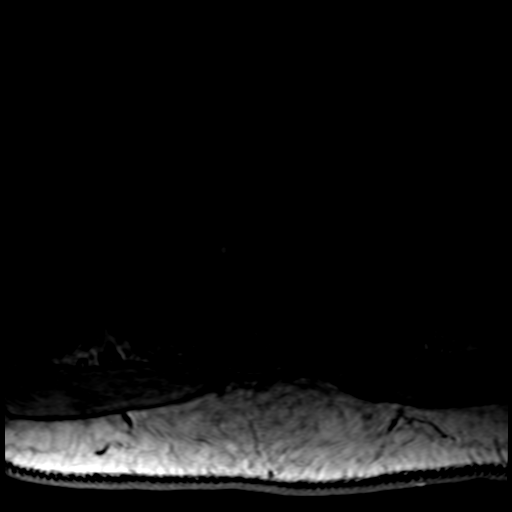
[im 34/68]
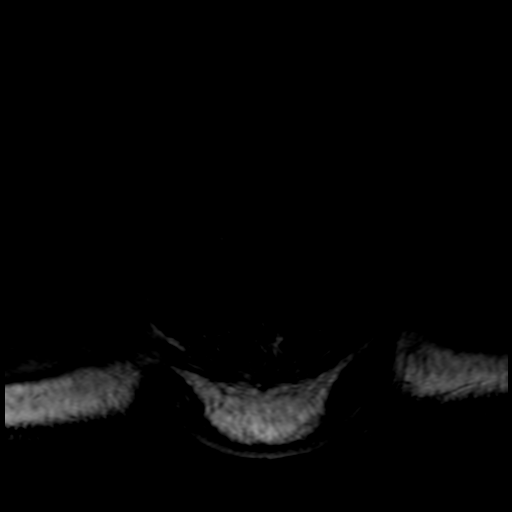
[im 51/68]
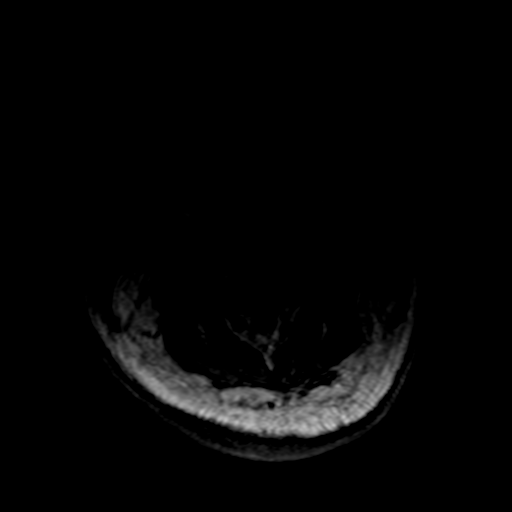
[im 68/68]
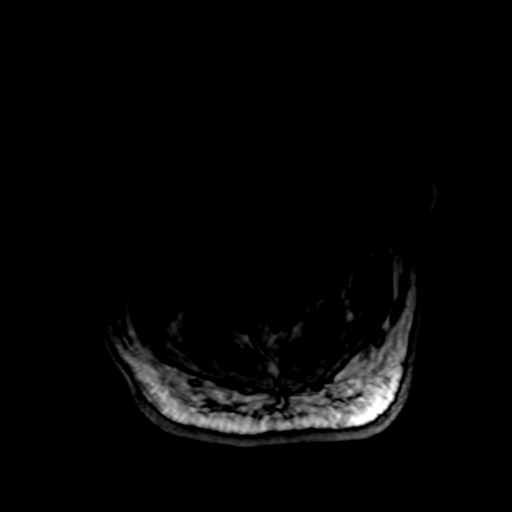

[Series 500: cor cemra ft · coronal · 1.4mm · 0.59mm/px · 3 of 96 slices shown]
[im 16/96]
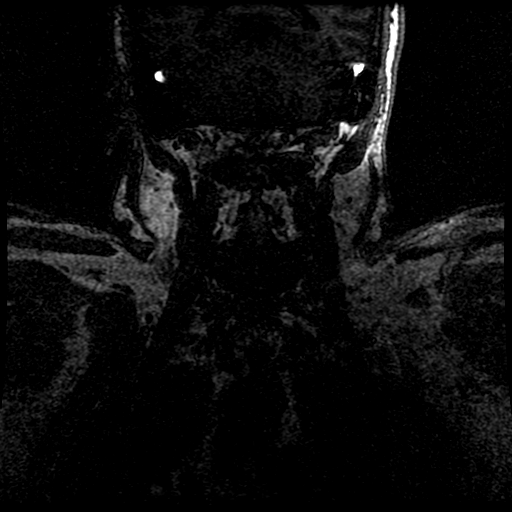
[im 48/96]
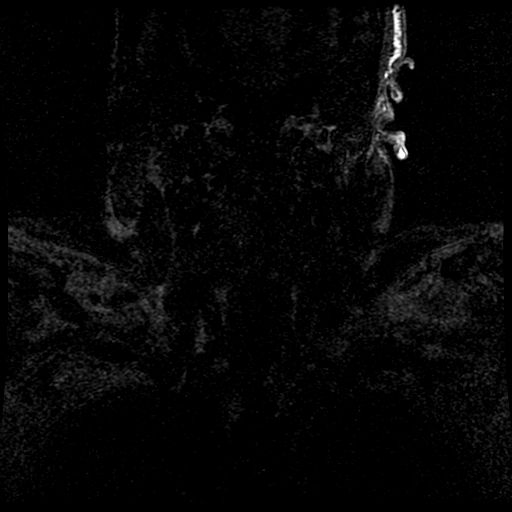
[im 80/96]
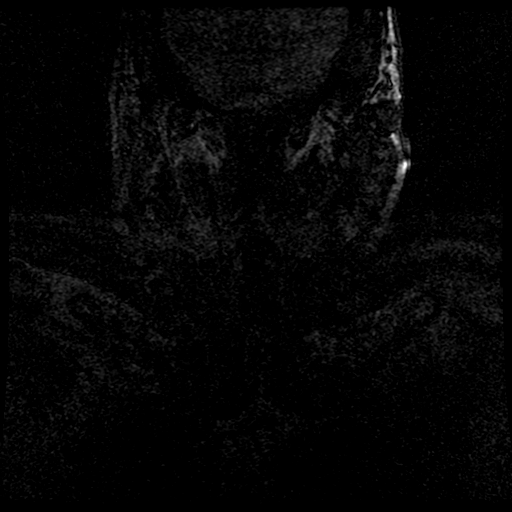

[Series 501: ph1/cor cemra ft · coronal · 1.4mm · 0.59mm/px · 3 of 96 slices shown]
[im 16/96]
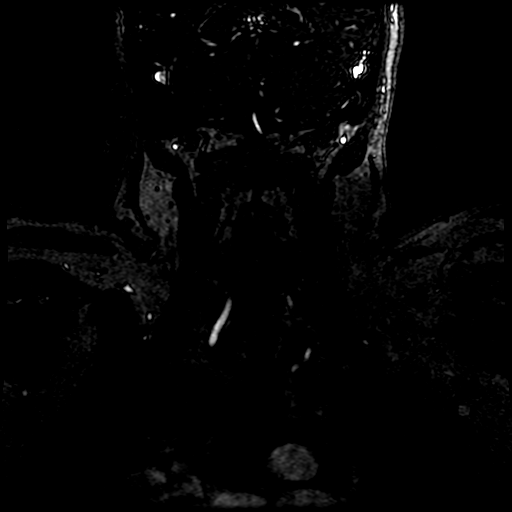
[im 48/96]
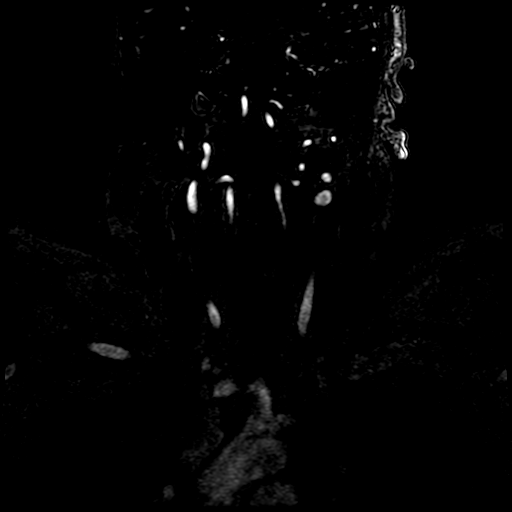
[im 80/96]
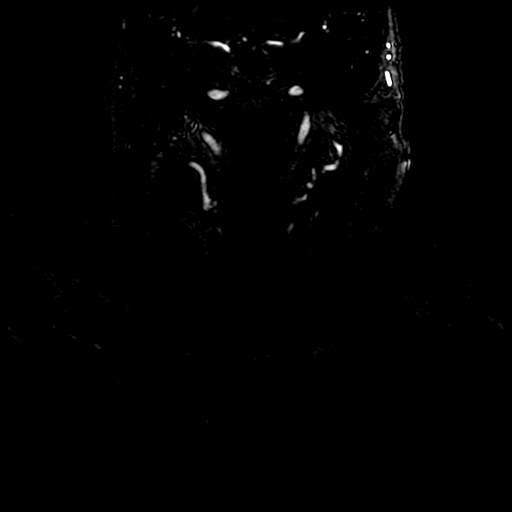

[19 of 48 positions shown; findings below may reference images not displayed]

FINDINGS: Aortic arch: Patent.  Bovine arch, normal variant.

Right common carotid artery: Patent.

Right internal carotid artery: Patent.

Right vertebral artery: Patent.  Right dominant.

Left common carotid artery: Patent.

Left Internal carotid artery: Patent.

Left Vertebral artery: The left vertebral artery origin is poorly
visualized, probably artifactual. Visible vessel is widely patent.

There is no evidence of high-grade stenosis, dissection, or aneurysm
unless noted above.
IMPRESSION: 1. Left vertebral artery origin is poorly visualized, probably
artifactual.
2. Carotid and vertebral arteries are otherwise widely patent
without evidence for stenosis, dissection, or aneurysm.

By: Relindas Auad M.D.

## 2023-01-22 ENCOUNTER — Emergency Department (HOSPITAL_COMMUNITY): Payer: No Typology Code available for payment source

## 2023-01-22 ENCOUNTER — Other Ambulatory Visit: Payer: Self-pay

## 2023-01-22 ENCOUNTER — Inpatient Hospital Stay (HOSPITAL_COMMUNITY): Payer: No Typology Code available for payment source

## 2023-01-22 ENCOUNTER — Encounter (HOSPITAL_COMMUNITY): Payer: Self-pay

## 2023-01-22 ENCOUNTER — Inpatient Hospital Stay (HOSPITAL_COMMUNITY)
Admission: EM | Admit: 2023-01-22 | Discharge: 2023-01-24 | DRG: 065 | Disposition: A | Discharge status: Home / Self Care | Payer: No Typology Code available for payment source | Attending: Internal Medicine | Admitting: Internal Medicine

## 2023-01-22 DIAGNOSIS — R262 Difficulty in walking, not elsewhere classified: Secondary | ICD-10-CM | POA: Diagnosis present

## 2023-01-22 DIAGNOSIS — I5022 Chronic systolic (congestive) heart failure: Secondary | ICD-10-CM | POA: Diagnosis present

## 2023-01-22 DIAGNOSIS — I63512 Cerebral infarction due to unspecified occlusion or stenosis of left middle cerebral artery: Secondary | ICD-10-CM | POA: Diagnosis present

## 2023-01-22 DIAGNOSIS — I639 Cerebral infarction, unspecified: Secondary | ICD-10-CM | POA: Diagnosis not present

## 2023-01-22 DIAGNOSIS — E785 Hyperlipidemia, unspecified: Secondary | ICD-10-CM | POA: Diagnosis present

## 2023-01-22 DIAGNOSIS — I130 Hypertensive heart and chronic kidney disease with heart failure and stage 1 through stage 4 chronic kidney disease, or unspecified chronic kidney disease: Secondary | ICD-10-CM | POA: Diagnosis present

## 2023-01-22 DIAGNOSIS — Z794 Long term (current) use of insulin: Secondary | ICD-10-CM | POA: Diagnosis not present

## 2023-01-22 DIAGNOSIS — G9389 Other specified disorders of brain: Secondary | ICD-10-CM | POA: Diagnosis present

## 2023-01-22 DIAGNOSIS — I513 Intracardiac thrombosis, not elsewhere classified: Secondary | ICD-10-CM | POA: Diagnosis present

## 2023-01-22 DIAGNOSIS — E669 Obesity, unspecified: Secondary | ICD-10-CM | POA: Diagnosis present

## 2023-01-22 DIAGNOSIS — Z7984 Long term (current) use of oral hypoglycemic drugs: Secondary | ICD-10-CM

## 2023-01-22 DIAGNOSIS — R471 Dysarthria and anarthria: Secondary | ICD-10-CM | POA: Diagnosis present

## 2023-01-22 DIAGNOSIS — Z79899 Other long term (current) drug therapy: Secondary | ICD-10-CM | POA: Diagnosis not present

## 2023-01-22 DIAGNOSIS — I428 Other cardiomyopathies: Secondary | ICD-10-CM | POA: Diagnosis present

## 2023-01-22 DIAGNOSIS — N183 Chronic kidney disease, stage 3 unspecified: Secondary | ICD-10-CM | POA: Diagnosis present

## 2023-01-22 DIAGNOSIS — Z955 Presence of coronary angioplasty implant and graft: Secondary | ICD-10-CM

## 2023-01-22 DIAGNOSIS — Z820 Family history of epilepsy and other diseases of the nervous system: Secondary | ICD-10-CM

## 2023-01-22 DIAGNOSIS — Z9181 History of falling: Secondary | ICD-10-CM | POA: Diagnosis not present

## 2023-01-22 DIAGNOSIS — E118 Type 2 diabetes mellitus with unspecified complications: Secondary | ICD-10-CM | POA: Diagnosis present

## 2023-01-22 DIAGNOSIS — I252 Old myocardial infarction: Secondary | ICD-10-CM | POA: Diagnosis not present

## 2023-01-22 DIAGNOSIS — E1122 Type 2 diabetes mellitus with diabetic chronic kidney disease: Secondary | ICD-10-CM | POA: Diagnosis present

## 2023-01-22 DIAGNOSIS — Z8673 Personal history of transient ischemic attack (TIA), and cerebral infarction without residual deficits: Secondary | ICD-10-CM

## 2023-01-22 DIAGNOSIS — Z6836 Body mass index (BMI) 36.0-36.9, adult: Secondary | ICD-10-CM | POA: Diagnosis not present

## 2023-01-22 DIAGNOSIS — Z880 Allergy status to penicillin: Secondary | ICD-10-CM

## 2023-01-22 DIAGNOSIS — R411 Anterograde amnesia: Secondary | ICD-10-CM | POA: Diagnosis present

## 2023-01-22 DIAGNOSIS — N1832 Chronic kidney disease, stage 3b: Secondary | ICD-10-CM | POA: Diagnosis present

## 2023-01-22 DIAGNOSIS — I255 Ischemic cardiomyopathy: Secondary | ICD-10-CM | POA: Diagnosis not present

## 2023-01-22 DIAGNOSIS — Z833 Family history of diabetes mellitus: Secondary | ICD-10-CM

## 2023-01-22 DIAGNOSIS — Z7902 Long term (current) use of antithrombotics/antiplatelets: Secondary | ICD-10-CM | POA: Diagnosis not present

## 2023-01-22 DIAGNOSIS — R29704 NIHSS score 4: Secondary | ICD-10-CM | POA: Diagnosis present

## 2023-01-22 DIAGNOSIS — I2510 Atherosclerotic heart disease of native coronary artery without angina pectoris: Secondary | ICD-10-CM | POA: Diagnosis present

## 2023-01-22 DIAGNOSIS — I6389 Other cerebral infarction: Secondary | ICD-10-CM | POA: Diagnosis not present

## 2023-01-22 DIAGNOSIS — Z87891 Personal history of nicotine dependence: Secondary | ICD-10-CM

## 2023-01-22 DIAGNOSIS — Z8249 Family history of ischemic heart disease and other diseases of the circulatory system: Secondary | ICD-10-CM

## 2023-01-22 DIAGNOSIS — Z886 Allergy status to analgesic agent status: Secondary | ICD-10-CM

## 2023-01-22 DIAGNOSIS — Z9861 Coronary angioplasty status: Secondary | ICD-10-CM

## 2023-01-22 DIAGNOSIS — Z83438 Family history of other disorder of lipoprotein metabolism and other lipidemia: Secondary | ICD-10-CM

## 2023-01-22 DIAGNOSIS — M109 Gout, unspecified: Secondary | ICD-10-CM | POA: Diagnosis present

## 2023-01-22 DIAGNOSIS — I63412 Cerebral infarction due to embolism of left middle cerebral artery: Secondary | ICD-10-CM | POA: Diagnosis present

## 2023-01-22 DIAGNOSIS — I236 Thrombosis of atrium, auricular appendage, and ventricle as current complications following acute myocardial infarction: Secondary | ICD-10-CM | POA: Diagnosis not present

## 2023-01-22 LAB — URINALYSIS, ROUTINE W REFLEX MICROSCOPIC
Bilirubin Urine: NEGATIVE
Glucose, UA: NEGATIVE mg/dL
Hgb urine dipstick: NEGATIVE
Ketones, ur: NEGATIVE mg/dL
Leukocytes,Ua: NEGATIVE
Nitrite: NEGATIVE
Protein, ur: NEGATIVE mg/dL
Specific Gravity, Urine: 1.017 (ref 1.005–1.030)
pH: 5 (ref 5.0–8.0)

## 2023-01-22 LAB — CBC
HCT: 46.5 % (ref 39.0–52.0)
HCT: 44.2 % (ref 39.0–52.0)
Hemoglobin: 14.3 g/dL (ref 13.0–17.0)
Hemoglobin: 14.3 g/dL (ref 13.0–17.0)
MCH: 32.9 pg (ref 26.0–34.0)
MCH: 32.3 pg (ref 26.0–34.0)
MCHC: 30.8 g/dL (ref 30.0–36.0)
MCHC: 32.4 g/dL (ref 30.0–36.0)
MCV: 107.1 fL — ABNORMAL HIGH (ref 80.0–100.0)
MCV: 99.8 fL (ref 80.0–100.0)
Platelets: 137 10*3/uL — ABNORMAL LOW (ref 150–400)
Platelets: 143 10*3/uL — ABNORMAL LOW (ref 150–400)
RBC: 4.34 MIL/uL (ref 4.22–5.81)
RBC: 4.43 MIL/uL (ref 4.22–5.81)
RDW: 13.1 % (ref 11.5–15.5)
RDW: 13.1 % (ref 11.5–15.5)
WBC: 4.9 10*3/uL (ref 4.0–10.5)
WBC: 5.3 10*3/uL (ref 4.0–10.5)
nRBC: 0 % (ref 0.0–0.2)
nRBC: 0 % (ref 0.0–0.2)

## 2023-01-22 LAB — GLUCOSE, CAPILLARY
Glucose-Capillary: 58 mg/dL — ABNORMAL LOW (ref 70–99)
Glucose-Capillary: 74 mg/dL (ref 70–99)
Glucose-Capillary: 148 mg/dL — ABNORMAL HIGH (ref 70–99)

## 2023-01-22 LAB — ETHANOL: Alcohol, Ethyl (B): <10 mg/dL (ref <10)

## 2023-01-22 LAB — CREATININE, SERUM
Creatinine, Ser: 1.25 mg/dL — ABNORMAL HIGH (ref 0.61–1.24)
GFR, Estimated: 60 mL/min — ABNORMAL LOW (ref >60)

## 2023-01-22 LAB — COMPREHENSIVE METABOLIC PANEL
ALT: 30 U/L (ref 0–44)
AST: 28 U/L (ref 15–41)
Albumin: 3.9 g/dL (ref 3.5–5.0)
Alkaline Phosphatase: 88 U/L (ref 38–126)
Anion gap: 7 (ref 5–15)
BUN: 16 mg/dL (ref 8–23)
CO2: 22 mmol/L (ref 22–32)
Calcium: 8.8 mg/dL — ABNORMAL LOW (ref 8.9–10.3)
Chloride: 109 mmol/L (ref 98–111)
Creatinine, Ser: 1.31 mg/dL — ABNORMAL HIGH (ref 0.61–1.24)
GFR, Estimated: 56 mL/min — ABNORMAL LOW (ref >60)
Glucose, Bld: 168 mg/dL — ABNORMAL HIGH (ref 70–99)
Potassium: 4.7 mmol/L (ref 3.5–5.1)
Sodium: 138 mmol/L (ref 135–145)
Total Bilirubin: 0.5 mg/dL (ref 0.3–1.2)
Total Protein: 6.8 g/dL (ref 6.5–8.1)

## 2023-01-22 LAB — DIFFERENTIAL
Abs Immature Granulocytes: 0.01 10*3/uL (ref 0.00–0.07)
Basophils Absolute: 0.1 10*3/uL (ref 0.0–0.1)
Basophils Relative: 1 %
Eosinophils Absolute: 0.1 10*3/uL (ref 0.0–0.5)
Eosinophils Relative: 2 %
Immature Granulocytes: 0 %
Lymphocytes Relative: 41 %
Lymphs Abs: 2 10*3/uL (ref 0.7–4.0)
Monocytes Absolute: 0.4 10*3/uL (ref 0.1–1.0)
Monocytes Relative: 8 %
Neutro Abs: 2.4 10*3/uL (ref 1.7–7.7)
Neutrophils Relative %: 48 %

## 2023-01-22 LAB — RAPID URINE DRUG SCREEN, HOSP PERFORMED
Amphetamines: NOT DETECTED
Barbiturates: NOT DETECTED
Benzodiazepines: NOT DETECTED
Cocaine: NOT DETECTED
Opiates: NOT DETECTED
Tetrahydrocannabinol: NOT DETECTED

## 2023-01-22 LAB — APTT: aPTT: 24 seconds (ref 24–36)

## 2023-01-22 LAB — PROTIME-INR
INR: 1.1 (ref 0.8–1.2)
Prothrombin Time: 13.7 seconds (ref 11.4–15.2)

## 2023-01-22 MED ORDER — ACETAMINOPHEN 325 MG PO TABS: 650.0000 mg | ORAL_TABLET | ORAL | Status: DC | PRN

## 2023-01-22 MED ORDER — INSULIN ASPART 100 UNIT/ML IJ SOLN: 0.0000 [IU] | Freq: Three times a day (TID) | INTRAMUSCULAR | Status: DC

## 2023-01-22 MED ORDER — LORAZEPAM 2 MG/ML IJ SOLN
INTRAMUSCULAR | Status: AC
  Administered 2023-01-22: 1 mg via INTRAVENOUS
  Filled 2023-01-22: qty 1

## 2023-01-22 MED ORDER — ACETAMINOPHEN 160 MG/5ML PO SOLN: 650.0000 mg | ORAL | Status: DC | PRN

## 2023-01-22 MED ORDER — LORAZEPAM 2 MG/ML IJ SOLN: 1.0000 mg | Freq: Once | INTRAMUSCULAR | Status: AC

## 2023-01-22 MED ORDER — IOHEXOL 350 MG/ML SOLN
75.0000 mL | Freq: Once | INTRAVENOUS | Status: AC | PRN
  Administered 2023-01-22: 75 mL via INTRAVENOUS

## 2023-01-22 MED ORDER — STROKE: EARLY STAGES OF RECOVERY BOOK
Freq: Once | Status: AC
  Filled 2023-01-22: qty 1

## 2023-01-22 MED ORDER — ATORVASTATIN CALCIUM 80 MG PO TABS
80.0000 mg | ORAL_TABLET | Freq: Every day | ORAL | Status: DC
  Administered 2023-01-23: 80 mg via ORAL
  Filled 2023-01-22: qty 1

## 2023-01-22 MED ORDER — ACETAMINOPHEN 650 MG RE SUPP: 650.0000 mg | RECTAL | Status: DC | PRN

## 2023-01-22 MED ORDER — INSULIN ASPART 100 UNIT/ML IJ SOLN: 0.0000 [IU] | Freq: Every day | INTRAMUSCULAR | Status: DC

## 2023-01-22 MED ORDER — ENOXAPARIN SODIUM 30 MG/0.3ML IJ SOSY
30.0000 mg | PREFILLED_SYRINGE | INTRAMUSCULAR | Status: DC
  Administered 2023-01-22 – 2023-01-23 (×2): 30 mg via SUBCUTANEOUS
  Filled 2023-01-22 (×2): qty 0.3

## 2023-01-22 NOTE — Progress Notes (Signed)
  Hypoglycemic Event  CBG: 58  Treatment: 8 oz juice/soda  Symptoms: Hungry  Follow-up CBG: [id number] CBG Result:74  Possible Reasons for Event: Inadequate meal intake  Comments/MD notified: notified Dr. Talbert Forest, [place]

## 2023-01-22 NOTE — ED Notes (Signed)
[facility] called for transport. 

## 2023-01-22 NOTE — ED Notes (Signed)
Attempt report to 3W

## 2023-01-22 NOTE — ED Notes (Signed)
Patient transported to MRI 

## 2023-01-22 NOTE — ED Triage Notes (Signed)
Reports loss of balance since yesterday morning. Endorses intermittent memory loss since Monday of this week.

## 2023-01-22 NOTE — Progress Notes (Signed)
Pt admitted to 3W01 at this time.  He is alert and oriented x 4, but slow to respond at times.  Denies pain. Son a bedside. Tele placed on patient, CCMD called and second verification completed.  Bed alarm on, call bell within reach and verbalizes understanding to call before getting out of bed.

## 2023-01-22 NOTE — ED Notes (Signed)
ED Provider at bedside. 

## 2023-01-22 NOTE — ED Notes (Signed)
[facility] arrived to transport patient.  

## 2023-01-22 NOTE — Consult Note (Signed)
NEUROLOGY CONSULTATION NOTE   Date of service: January 22, 2023 Patient Name: Ralph Short MRN:  [id number] DOB:  12/22/1945 Reason for consult: stroke workup Requesting physician: Dr. Tana Coast _ _ _   _ __   _ __ _ _  __ __   _ __   __ _  History of Present Illness   Ralph Short is a 77 y.o. male with PMH significant of CAD, CKD stage III, diabetes mellitus type 2, IDDM, gout, HTN, history of systolic CHF presented to ED with symptoms of headaches, slurred speech which started 1 week ago.  Also has a history of prior stroke in the left hemisphere with no residual deficits that happened in 2018.  According to Ralph Short, patient's son, patient had headaches and word finding difficulty on Thursday 3/14 while he was trying to recite The Lord's Prayer. It was not until yesterday (3/20) that the patient told him he had a stroke but patient did not want to come to the hospital. This morning, the patient was having some difficulty walking and continued to have word-finding difficulty and slurred speech so Ralph Short brought him to the ED.  Patient confirms this story and also reports anterograde amnesia (told himself he was going to get soda, and the next thing he remembers is him sitting on chair without a soda in hand). He also reports difficulty writing as when he was trying to fill out a check.  The patient gave me a bag with all his medications in it. Medications per physical pills: Glipizide 10 mg BID Allopurinol 100 mg daily Atorvastatin 80 mg daily Duloxetine 20 mg Clopidogrel 75 mg daily Carvedilol 6.25 mg BID Acetaminophen 500 mg TID Potassium 20 mEq daily Losartan 25 mg daily Insulin  Patient says these are all the medications he takes and reports taking them daily as prescribed without fail.  Patient had a prior stroke in 2018 where he had significant deficits on the right side. Prior to 01/15/2023, patient and Ralph Short confirm he was fully functional without residual deficits.  He does not  smoke, drink, or use recreational drugs. He does not know if he snores at night.  ROS   Per HPI: all other systems reviewed and are negative  Past History   I have reviewed the following:  Past Medical History:  Diagnosis Date   CAD S/P percutaneous coronary angioplasty - PCI LAD LAD (Xience DES) to prox LAD and mid LAD 07/05/13 07/05/2013   CKD (chronic kidney disease) stage 3, GFR 30-59 ml/min ([place]) 07/06/2013   Diabetes mellitus without complication ([place])    Diverticulitis    Diverticulitis    DM (diabetes mellitus), type 2, uncontrolled, recently began insulin 07/05/2013   Gout 07/05/2013   History of agent Orange exposure 07/05/2013   Hypertension    LV dysfunction, s/p MI, 07/05/13 07/05/2013   Presence of drug coated stent in LAD coronary artery 07/05/2013   2 Xience Xpedition DES to prox & mid LAD -- in STEMI    STEMI (ST elevation myocardial infarction)of ANT wall-total occ. of LAD  07/05/2013   Past Surgical History:  Procedure Laterality Date   COLON SURGERY     CORONARY ANGIOPLASTY WITH STENT PLACEMENT  07/05/13   emergency PCI and stenting in the setting of anterior STEMI a total proximal LAD with excellent angiographic result and a door to balloon time of 25 minutes.    KNEE SURGERY     LEFT HEART CATHETERIZATION WITH CORONARY ANGIOGRAM N/A 07/05/2013   Procedure:  LEFT HEART CATHETERIZATION WITH CORONARY ANGIOGRAM;  Surgeon: Lorretta Harp, MD;  Location: [facility] CATH LAB;  Service: Cardiovascular;  Laterality: N/A;   TEE WITHOUT CARDIOVERSION N/A 03/06/2017   Procedure: TRANSESOPHAGEAL ECHOCARDIOGRAM (TEE);  Surgeon: Dorothy Spark, MD;  Location[facility] ENDOSCOPY;  Service: Cardiovascular;  Laterality: N/A;   Family History  Problem Relation Age of Onset   Alzheimer's disease Mother    Hyperlipidemia Mother    Hypertension Mother    Diabetes type II Mother    Hypertension Father    Alzheimer's disease Father    Diabetes type II Father    Diabetes type II Sister    Hypertension  Sister    Heart disease Sister    Heart disease Brother    Hypertension Brother    Diabetes type II Brother    Diabetes type II Sister    Hypertension Sister    Diabetes type II Sister    Hypertension Sister    Heart disease Sister    Diabetes type II Sister    Hypertension Sister    Diabetes type II Sister    Hypertension Sister    Diabetes type II Sister    Hypertension Sister    Hypertension Brother    Diabetes type II Brother    Hypertension Brother    Diabetes type II Brother    Spina bifida Son    Social History   Socioeconomic History   Marital status: Single    Spouse name: Not on file   Number of children: Not on file   Years of education: Not on file   Highest education level: Not on file  Occupational History   Not on file  Tobacco Use   Smoking status: Former    Types: Cigarettes    Quit date: 11/03/1992    Years since quitting: 30.2   Smokeless tobacco: Never  Substance and Sexual Activity   Alcohol use: No   Drug use: No   Sexual activity: Not on file  Other Topics Concern   Not on file  Social History Narrative   Not on file   Social Determinants of Health   Financial Resource Strain: Not on file  Food Insecurity: Not on file  Transportation Needs: Not on file  Physical Activity: Not on file  Stress: Not on file  Social Connections: Not on file   Allergies  Allergen Reactions   Penicillins Hives   Aspirin Palpitations    Medications   (Not in a hospital admission)    No current facility-administered medications for this encounter.  Current Outpatient Medications:    allopurinol (ZYLOPRIM) 100 MG tablet, Take 100 mg by mouth daily. , Disp: , Rfl:    ARTIFICIAL TEAR OP, Place 1 drop into both eyes at bedtime., Disp: , Rfl:    atorvastatin (LIPITOR) 80 MG tablet, Take 1 tablet (80 mg total) by mouth daily at 6 PM., Disp: 30 tablet, Rfl: 5   carvedilol (COREG) 6.25 MG tablet, Take 6.25 mg by mouth 2 (two) times daily with a meal.,  Disp: , Rfl:    clopidogrel (PLAVIX) 75 MG tablet, Take 1 tablet (75 mg total) by mouth daily., Disp: 90 tablet, Rfl: 3   clotrimazole (LOTRIMIN) 1 % cream, Apply 1 application topically as needed (for skin)., Disp: , Rfl:    glipiZIDE (GLUCOTROL) 10 MG tablet, Take 10 mg by mouth 2 (two) times daily before a meal., Disp: , Rfl:    insulin glargine (LANTUS) 100 UNIT/ML injection, Inject 50 Units  into the skin at bedtime. , Disp: , Rfl:    meclizine (ANTIVERT) 25 MG tablet, Take 1 tablet (25 mg total) by mouth 3 (three) times daily as needed for dizziness., Disp: 20 tablet, Rfl: 0   nitroGLYCERIN (NITROSTAT) 0.4 MG SL tablet, Place 1 tablet (0.4 mg total) under the tongue every 5 (five) minutes as needed for chest pain., Disp: 25 tablet, Rfl: 2   potassium chloride SA (K-DUR,KLOR-CON) 20 MEQ tablet, Take 1 tablet (20 mEq total) by mouth daily., Disp: 30 tablet, Rfl: 5  Vitals   Vitals:   01/22/23 1100 01/22/23 1115 01/22/23 1200 01/22/23 1226  BP: (!) 174/99 (!) 149/101    Pulse: 73 74  72  Resp:      Temp:   97.9 F (36.6 C)   TempSrc:   Oral   SpO2: 98% 98%  98%  Weight:      Height:         Body mass index is 36.88 kg/m.  Physical Exam   Physical Exam General: in no acute distress and well-appearing HEENT: normocephalic and atraumatic Respiratory: non-labored breathing and on RA Extremities: moving all extremities spontaneously  Neuro: Mental Status: Ralph Short is alert; he is oriented to self, place, time, and situation and oriented to other person/s, namely Ralph Short his son . Speech was slightly slurred  with evidence of expressive aphasia (word-finding difficulty and word salad) . He was able to follow 3 step commands without difficulty.  Cranial Nerves: II:  Visual fields grossly normal; pupils equal, round, reactive to light and accommodation III,IV, VI: no ptosis, extra-ocular motions intact bilaterally V,VII: smile asymmetric with trace droop on R side without  flattening of nasolabial fold, facial light touch sensation intact bilaterally VIII: hearing intact to voice IX,X: uvula rises symmetrically XI: shoulder symmetrically elevate bilaterally XII: midline tongue extension without atrophy and without fasciculations  Motor: Upper extremities: Right 5/5 full power Left 5/5 full power  Lower extremities: Right 5/5 full power Left 5/5 full power  Tone and bulk: normal tone throughout; no atrophy noted  Sensory: sensation to light touch intact throughout bilaterally  Cerebellar: Finger-to-nose test normal, heel-to-shin test normal  Gait: not observed during encounter  NIH stroke scale 2 Modified Rankin scale 1  Labs   CBC:  Recent Labs  Lab 01/22/23 0809  WBC 4.9  NEUTROABS 2.4  HGB 14.3  HCT 46.5  MCV 107.1*  PLT 137*    Basic Metabolic Panel:  Lab Results  Component Value Date   NA 138 01/22/2023   K 4.7 01/22/2023   CO2 22 01/22/2023   GLUCOSE 168 (H) 01/22/2023   BUN 16 01/22/2023   CREATININE 1.31 (H) 01/22/2023   CALCIUM 8.8 (L) 01/22/2023   GFRNONAA 56 (L) 01/22/2023   GFRAA 51 (L) 03/07/2017   Lipid Panel:  Lab Results  Component Value Date   LDLCALC 56 03/04/2017   HgbA1c:  Lab Results  Component Value Date   HGBA1C 8.7 (H) 03/04/2017   Urine Drug Screen:     Component Value Date/Time   LABOPIA NONE DETECTED 01/22/2023 1110   COCAINSCRNUR NONE DETECTED 01/22/2023 1110   LABBENZ NONE DETECTED 01/22/2023 1110   AMPHETMU NONE DETECTED 01/22/2023 1110   THCU NONE DETECTED 01/22/2023 1110   LABBARB NONE DETECTED 01/22/2023 1110    Alcohol Level     Component Value Date/Time   ETH <10 01/22/2023 0809   CT Head without contrast (3/21) IMPRESSION: 1. Moderate-sized left MCA territory cortical/subcortical infarct within  the left temporal and parietal lobes, new from the prior brain MRI of 03/03/2017 and favored acute/subacute. Consider a brain MRI for further evaluation. 2. Background  parenchymal atrophy, chronic small vessel ischemic disease and chronic infarcts as described. 3. Redemonstrated focus of chronic encephalomalacia/gliosis within the anterior right frontal lobe, likely posttraumatic in etiology.  MRI Brain w/o contrast (3/21) IMPRESSION: 1. Acute left posterior MCA territory infarct. Mild associated petechial hemorrhage. 2. Adjacent/anterior remote left MCA territory and remote right frontal encephalomalacia.  CT angio Head and Neck with contrast: Ordered, pending imaging  Echocardiogram Ordered, pending imaging  Impression  77 year old man who has a past medical history of CAD, CKD 3, diabetes, gout, hypertension, systolic CHF, prior stroke in the left hemisphere with no residual deficits presenting for over a weeks worth of word finding difficulty and on exam has expressive aphasia with imaging showing new left hemispheric infarcts posterior to the prior known stroke.  He needs admission for stroke risk factor workup.  He is outside the window for IV thrombolysis and endovascular thrombectomy.  Recommendations  -Telemetry -Frequent neurochecks - F/u HbA1c and lipid panel - ordered, results pending.  Goal A1c less than 7, goal LDL less than 70. - F/u CT angio head and neck, 2D echo - ordered, imaging pending  - Plavix at home - change to ASA 81 - strict glucose control - ok to normalize BP given > 1 week since last known well - PT/OT/SLP eval and treat - outpatient sleep study for OSA (multiple risk factors)  Signed, Camelia Phenes, MD PGY-1 Resident  Attending Neurohospitalist Addendum Patient seen and examined with APP/Resident. Agree with the history and physical as documented above. Agree with the plan as documented, which I helped formulate. I have independently reviewed the chart, obtained history, review of systems and examined the patient.I have personally reviewed pertinent head/neck/spine imaging (CT/MRI). Please feel free to call  with any questions.  -- Amie Portland, MD Neurologist [facility] Pager: [phone]

## 2023-01-22 NOTE — ED Provider Notes (Signed)
[facility]MENT AT [facility] Provider Note   CSN: [id number] Arrival date & time: 01/22/23  0732     History  Chief Complaint  Patient presents with   Weakness    Reports loss of balance since he woke up yesterday morning.  States he fell 1 month ago, was evaluated and d/c.   Aphasia    DONTEL HENDRIKS is a 77 y.o. male.  SRICHARAN TREADAWAY is a 77 y.o. male with a history of prior stroke, hypertension, diabetes, CAD s/p PCI, CKD, who presents to the ED for evaluation of dysarthria and concern for possible stroke.  Patient reports he started having difficulty with his speech 1 week ago.  He reports symptoms came on suddenly last Thursday when he noted he was having trouble saying the Lord's prayer which he typically says every day.  His son is at bedside whom he lives with and states that typically he has no difficulty having a normal conversation but he has had a lot of difficulty with word finding over the past week.  Son reports that his father kept insisting he was okay and did not want to seek medical evaluation.  He reports that he has had some persistent left-sided headache as well and to have some intermittent vertigo.  This morning when he got up he felt more unsteady and had continued to have persistent dysarthria and so agreed to come to the hospital.  No associated numbness, weakness, vision changes, facial droop.  He denies any associated chest pain, shortness of breath or palpitations.  No abdominal pain, vomiting or diarrhea.  The history is provided by the patient.  Weakness Associated symptoms: dizziness and headaches   Associated symptoms: no abdominal pain, no chest pain, no fever, no nausea, no shortness of breath and no vomiting        Home Medications Prior to Admission medications   Medication Sig Start Date End Date Taking? Authorizing Provider  allopurinol (ZYLOPRIM) 100 MG tablet Take 100 mg by mouth daily.     [provider]   ARTIFICIAL TEAR OP Place 1 drop into both eyes at bedtime.    [provider]  atorvastatin (LIPITOR) 80 MG tablet Take 1 tablet (80 mg total) by mouth daily at 6 PM. 07/14/13   Rosita Fire, Wilmer Floor, PA-C  carvedilol (COREG) 6.25 MG tablet Take 6.25 mg by mouth 2 (two) times daily with a meal.    [provider]  clopidogrel (PLAVIX) 75 MG tablet Take 1 tablet (75 mg total) by mouth daily. 09/13/13   Brett Canales, PA-C  clotrimazole (LOTRIMIN) 1 % cream Apply 1 application topically as needed (for skin).    [provider]  glipiZIDE (GLUCOTROL) 10 MG tablet Take 10 mg by mouth 2 (two) times daily before a meal.    [provider]  insulin glargine (LANTUS) 100 UNIT/ML injection Inject 50 Units into the skin at bedtime.     [provider]  meclizine (ANTIVERT) 25 MG tablet Take 1 tablet (25 mg total) by mouth 3 (three) times daily as needed for dizziness. 03/08/15   Orpah Greek, MD  nitroGLYCERIN (NITROSTAT) 0.4 MG SL tablet Place 1 tablet (0.4 mg total) under the tongue every 5 (five) minutes as needed for chest pain. 07/14/13   Lyda Jester M, PA-C  potassium chloride SA (K-DUR,KLOR-CON) 20 MEQ tablet Take 1 tablet (20 mEq total) by mouth daily. 07/14/13   Consuelo Pandy, PA-C  Allergies    Penicillins and Aspirin    Review of Systems   Review of Systems  Constitutional:  Negative for chills and fever.  HENT: Negative.    Eyes:  Negative for visual disturbance.  Respiratory:  Negative for shortness of breath.   Cardiovascular:  Negative for chest pain.  Gastrointestinal:  Negative for abdominal pain, nausea and vomiting.  Neurological:  Positive for dizziness, speech difficulty and headaches. Negative for syncope, weakness, light-headedness and numbness.  All other systems reviewed and are negative.   Physical Exam Updated Vital Signs BP (!) 165/102   Pulse 82   Temp 98.3 F (36.8 C) (Oral)   Resp 18   Ht 5'  10" (1.778 m)   Wt 116.6 kg   SpO2 99%   BMI 36.88 kg/m  Physical Exam Vitals and nursing note reviewed.  Constitutional:      General: He is not in acute distress.    Appearance: Normal appearance. He is well-developed. He is not ill-appearing or diaphoretic.  HENT:     Head: Normocephalic and atraumatic.     Mouth/Throat:     Mouth: Mucous membranes are moist.     Pharynx: Oropharynx is clear.  Eyes:     General:        Right eye: No discharge.        Left eye: No discharge.     Extraocular Movements: Extraocular movements intact.     Pupils: Pupils are equal, round, and reactive to light.  Cardiovascular:     Rate and Rhythm: Normal rate and regular rhythm.     Pulses: Normal pulses.     Heart sounds: Normal heart sounds.  Pulmonary:     Effort: Pulmonary effort is normal. No respiratory distress.     Breath sounds: Normal breath sounds.  Abdominal:     General: Bowel sounds are normal. There is no distension.     Palpations: Abdomen is soft. There is no mass.     Tenderness: There is no abdominal tenderness. There is no guarding.  Musculoskeletal:     Cervical back: Neck supple. No tenderness.  Skin:    General: Skin is warm and dry.  Neurological:     Mental Status: He is alert.     Comments: Patient is alert and oriented, seems to be somewhat dysarthric with difficulty with word finding.  No facial droop, cranial nerves II through XII intact.  5/5 strength in bilateral upper and lower extremities with normal sensation intact in all extremities. No dysmetria noted with finger-nose testing, no pronator drift.  Psychiatric:        Mood and Affect: Mood normal.        Behavior: Behavior normal.     ED Results / Procedures / Treatments   Labs (all labs ordered are listed, but only abnormal results are displayed) Labs Reviewed  CBC - Abnormal; Notable for the following components:      Result Value   MCV 107.1 (*)    Platelets 137 (*)    All other components  within normal limits  COMPREHENSIVE METABOLIC PANEL - Abnormal; Notable for the following components:   Glucose, Bld 168 (*)    Creatinine, Ser 1.31 (*)    Calcium 8.8 (*)    GFR, Estimated 56 (*)    All other components within normal limits  ETHANOL  PROTIME-INR  APTT  DIFFERENTIAL  RAPID URINE DRUG SCREEN, HOSP PERFORMED  URINALYSIS, ROUTINE W REFLEX MICROSCOPIC    EKG None  Radiology MR BRAIN WO CONTRAST  Result Date: 01/22/2023 CLINICAL DATA:  Neuro deficit, acute, stroke suspected EXAM: MRI HEAD WITHOUT CONTRAST TECHNIQUE: Multiplanar, multiecho pulse sequences of the brain and surrounding structures were obtained without intravenous contrast. COMPARISON:  CT head from the same day FINDINGS: Brain: Acute left posterior MCA territory infarct. Associated edema without significant mass effect. No midline shift. Mild associated petechial hemorrhage. No mass occupying acute hemorrhage. Adjacent/anterior remote left MCA territory infarct. No hydrocephalus. Remote right frontal encephalomalacia. Cerebral atrophy. Vascular: Major arterial flow voids are maintained at the skull base. Skull and upper cervical spine: Normal marrow signal. Sinuses/Orbits: Clear sinuses.  No acute orbital findings. Other: No mastoid effusions. IMPRESSION: 1. Acute left posterior MCA territory infarct. Mild associated petechial hemorrhage. 2. Adjacent/anterior remote left MCA territory and remote right frontal encephalomalacia. Electronically Signed   By: Margaretha Sheffield M.D.   On: 01/22/2023 10:09   CT Head Wo Contrast  Addendum Date: 01/22/2023   ADDENDUM REPORT: 01/22/2023 09:50 ADDENDUM: Impression #1 called by telephone at the time of interpretation on 01/22/2023 at 9:50 am to provider [facility] , who verbally acknowledged these results. Electronically Signed   By: Kellie Simmering D.O.   On: 01/22/2023 09:50   Result Date: 01/22/2023 CLINICAL DATA:  Provided history: Neuro deficit, acute, stroke suspected.  Weakness. Confusion. Severe headache. Disorientation. EXAM: CT HEAD WITHOUT CONTRAST TECHNIQUE: Contiguous axial images were obtained from the base of the skull through the vertex without intravenous contrast. RADIATION DOSE REDUCTION: This exam was performed according to the departmental dose-optimization program which includes automated exposure control, adjustment of the mA and/or kV according to patient size and/or use of iterative reconstruction technique. COMPARISON:  Brain MRI 03/03/2017. Head CT 03/08/2015 FINDINGS: Brain: Mild generalized parenchymal atrophy. Redemonstrated small chronic cortical/subcortical infarcts within the right frontoparietal lobes and bilateral occipital lobes. Known moderate-sized chronic cortical/subcortical left MCA vascular territory (within the left frontal and parietal lobes). New from the prior brain MRI of 03/03/2017, there is a moderate-sized left MCA territory cortical/subcortical infarct within the left temporal and parietal lobes (for instance as seen on series 2, image 20). This is favored acute/subacute. No significant mass effect. No evidence of hemorrhagic conversion. Redemonstrated focus of chronic encephalomalacia/gliosis within the anterior right frontal lobe, likely posttraumatic in etiology. Background mild patchy and ill-defined hypoattenuation within the cerebral white matter, nonspecific but compatible with chronic small vessel ischemic disease. No extra-axial fluid collection. No evidence of an intracranial mass. No midline shift. Vascular: No hyperdense vessel.  Atherosclerotic calcifications. Skull: No fracture or aggressive osseous lesion. Sinuses/Orbits: No mass or acute finding within the imaged orbits. No significant paranasal sinus disease at the imaged levels. Attempts are being made to reach the ordering provider at this time. IMPRESSION: 1. Moderate-sized left MCA territory cortical/subcortical infarct within the left temporal and parietal lobes,  new from the prior brain MRI of 03/03/2017 and favored acute/subacute. Consider a brain MRI for further evaluation. 2. Background parenchymal atrophy, chronic small vessel ischemic disease and chronic infarcts as described. 3. Redemonstrated focus of chronic encephalomalacia/gliosis within the anterior right frontal lobe, likely posttraumatic in etiology. Electronically Signed: By: Kellie Simmering D.O. On: 01/22/2023 09:43    Procedures Procedures    Medications Ordered in ED Medications  LORazepam (ATIVAN) injection 1 mg (1 mg Intravenous Given 01/22/23 1013)    ED Course/ Medical Decision Making/ A&P  Medical Decision Making Amount and/or Complexity of Data Reviewed Labs: ordered. Radiology: ordered.  Risk Prescription drug management.   77 y.o. male presents to the ED with complaints of dysarthria, this involves an extensive number of treatment options, and is a complaint that carries with it a high risk of complications and morbidity.  The differential diagnosis includes stroke, TIA, recrudescence of prior stroke, brain mass  On arrival pt is nontoxic, vitals mildly elevated blood pressure  of 165/87.  Dysarthria present on exam but no other focal neurologic deficits noted.  Additional history obtained from son at bedside. Previous records obtained and reviewed   Lab Tests:  I Ordered, reviewed, and interpreted labs, which included: No leukocytosis, normal hemoglobin, glucose 168, creatinine 1.31, no other electrolyte derangements, normal LFTs, UA and UDS negative.  Ethanol negative.  Coags normal.  Globin is 6  Imaging Studies ordered:  I ordered imaging studies which included CT head and MRI of the brain, I independently visualized and interpreted imaging which showed moderate-sized left MCA infarct with associated petechial hemorrhage noted on MRI  ED Course:   Discussed results of head imaging with patient and son at bedside with diagnosis of new  left MCA stroke.  Patient will require admission for further evaluation and rehab.  Case discussed with Dr. Rory Percy with neurology who requests that the patient be admitted at [facility] so neuro can follow and make appropriate recommendations.  Case discussed with Dr. Tana Coast with [facility] hospitalist who will see and admit the patient.   Portions of this note were generated with [employer]. Dictation errors may occur despite best attempts at proofreading.         Final Clinical Impression(s) / ED Diagnoses Final diagnoses:  Acute ischemic left MCA stroke [facility])    Rx / DC Orders ED Discharge Orders     None         Janet Berlin 01/22/23 [place], [place], DO 01/23/23 [phone]

## 2023-01-22 NOTE — ED Notes (Signed)
Back from mri

## 2023-01-22 NOTE — ED Triage Notes (Addendum)
Hx of a stroke. Feeling weak, increasing confusion, disoriented and severe headache 1 week ago. On thinners

## 2023-01-22 NOTE — H&P (Signed)
History and Physical  Patient: Ralph Short [id number] DOB: 1945-12-12 DOA: 01/22/2023 DOS: the patient was seen and examined on 01/22/2023 Patient coming from: Home  Chief Complaint:  Chief Complaint  Patient presents with   Weakness    Reports loss of balance since he woke up yesterday morning.  States he fell 1 month ago, was evaluated and d/c.   Aphasia   HPI: Ralph Short is a 77 y.o. male with PMH significant of CAD, CKD stage III, diabetes mellitus type 2, IDDM, gout, HTN, history of systolic CHF presented to ED with symptoms of headaches, slurred speech since Thursday, 01/22/2023.  History was obtained from patient and son at the bedside.  Per patient, he had been having headaches since last month when he fell and hit his head.  He was [facility] ED on 12/09/2022, CT head showed no acute intracranial abnormality.  On Thursday, 3/21, patient noted that he was having headaches and noticed some slurred speech .  No focal weakness or facial drooping at the time.  Patient did not have any significant improvement.  Yesterday, on Wednesday, 01/21/2023, patient had vague symptoms of numbness and tingling on both sides, loss of balance and intermittent memory loss.  He felt weak and disoriented and felt that he probably had a stroke which alerted him to seek medical care. Otherwise no chest pain, shortness of breath, nausea vomiting, abdominal pain.   ED course:  In ED, temp 98.3 F, RR 20, pulse 82, BP 164/90, O2 sats 98% on room air Patient has passed a bedside swallow screen. Labs showed sodium 138, BUN 16, creatinine 1.3. CBC showed WBCs 4.9, hemoglobin 14.3, hematocrit 46.5, MCV 107.1  MRI brain showed acute left posterior MCA territory infarct, mild associated petechial hemorrhage.  Adjacent/anterior remote left MCA territory and remote right frontal encephalomalacia.  Review of Systems: As mentioned in the history of present illness. All other systems reviewed and are  negative. Past Medical History:  Diagnosis Date   CAD S/P percutaneous coronary angioplasty - PCI LAD LAD (Xience DES) to prox LAD and mid LAD 07/05/13 07/05/2013   CKD (chronic kidney disease) stage 3, GFR 30-59 ml/min ([place]) 07/06/2013   Diabetes mellitus without complication ([place])    Diverticulitis    Diverticulitis    DM (diabetes mellitus), type 2, uncontrolled, recently began insulin 07/05/2013   Gout 07/05/2013   History of agent Orange exposure 07/05/2013   Hypertension    LV dysfunction, s/p MI, 07/05/13 07/05/2013   Presence of drug coated stent in LAD coronary artery 07/05/2013   2 Xience Xpedition DES to prox & mid LAD -- in STEMI    STEMI (ST elevation myocardial infarction)of ANT wall-total occ. of LAD  07/05/2013   Past Surgical History:  Procedure Laterality Date   COLON SURGERY     CORONARY ANGIOPLASTY WITH STENT PLACEMENT  07/05/13   emergency PCI and stenting in the setting of anterior STEMI a total proximal LAD with excellent angiographic result and a door to balloon time of 25 minutes.    KNEE SURGERY     LEFT HEART CATHETERIZATION WITH CORONARY ANGIOGRAM N/A 07/05/2013   Procedure: LEFT HEART CATHETERIZATION WITH CORONARY ANGIOGRAM;  Surgeon: Lorretta Harp, MD;  Location: [facility] CATH LAB;  Service: Cardiovascular;  Laterality: N/A;   TEE WITHOUT CARDIOVERSION N/A 03/06/2017   Procedure: TRANSESOPHAGEAL ECHOCARDIOGRAM (TEE);  Surgeon: Dorothy Spark, MD;  Location: [facility] ENDOSCOPY;  Service: Cardiovascular;  Laterality: N/A;   Social History:  reports that  he quit smoking about 30 years ago. He has never used smokeless tobacco. He reports that he does not drink alcohol and does not use drugs. Allergies  Allergen Reactions   Penicillins Hives   Aspirin Palpitations   Family History  Problem Relation Age of Onset   Alzheimer's disease Mother    Hyperlipidemia Mother    Hypertension Mother    Diabetes type II Mother    Hypertension Father    Alzheimer's disease Father    Diabetes  type II Father    Diabetes type II Sister    Hypertension Sister    Heart disease Sister    Heart disease Brother    Hypertension Brother    Diabetes type II Brother    Diabetes type II Sister    Hypertension Sister    Diabetes type II Sister    Hypertension Sister    Heart disease Sister    Diabetes type II Sister    Hypertension Sister    Diabetes type II Sister    Hypertension Sister    Diabetes type II Sister    Hypertension Sister    Hypertension Brother    Diabetes type II Brother    Hypertension Brother    Diabetes type II Brother    Spina bifida Son    Prior to Admission medications   Medication Sig Start Date End Date Taking? Authorizing Provider  allopurinol (ZYLOPRIM) 100 MG tablet Take 100 mg by mouth daily.     [provider]  ARTIFICIAL TEAR OP Place 1 drop into both eyes at bedtime.    [provider]  atorvastatin (LIPITOR) 80 MG tablet Take 1 tablet (80 mg total) by mouth daily at 6 PM. 07/14/13   Rosita Fire, Wilmer Floor, PA-C  carvedilol (COREG) 6.25 MG tablet Take 6.25 mg by mouth 2 (two) times daily with a meal.    [provider]  clopidogrel (PLAVIX) 75 MG tablet Take 1 tablet (75 mg total) by mouth daily. 09/13/13   Brett Canales, PA-C  clotrimazole (LOTRIMIN) 1 % cream Apply 1 application topically as needed (for skin).    [provider]  glipiZIDE (GLUCOTROL) 10 MG tablet Take 10 mg by mouth 2 (two) times daily before a meal.    [provider]  insulin glargine (LANTUS) 100 UNIT/ML injection Inject 50 Units into the skin at bedtime.     [provider]  meclizine (ANTIVERT) 25 MG tablet Take 1 tablet (25 mg total) by mouth 3 (three) times daily as needed for dizziness. 03/08/15   Orpah Greek, MD  nitroGLYCERIN (NITROSTAT) 0.4 MG SL tablet Place 1 tablet (0.4 mg total) under the tongue every 5 (five) minutes as needed for chest pain. 07/14/13   Lyda Jester M, PA-C  potassium chloride SA  (K-DUR,KLOR-CON) 20 MEQ tablet Take 1 tablet (20 mEq total) by mouth daily. 07/14/13   Consuelo Pandy, PA-C   Physical Exam: Vitals:   01/22/23 0745 01/22/23 0800 01/22/23 0820 01/22/23 0830  BP: (!) 156/87 (!) 164/90 (!) 161/88 (!) 165/102  Pulse:  82 74 82  Resp:  20 20 18   Temp:      TempSrc:      SpO2:  98% 98% 99%  Weight:      Height:         General: Alert, awake, oriented x3, NAD, mild dysarthria Eyes: pink conjunctiva, anicteric sclera, PERLA HEENT: normocephalic, atraumatic, oropharynx clear Neck: supple, no masses or lymphadenopathy, no JVD CVS: Regular rate  and rhythm, no murmurs, rubs or gallops. Resp : Clear to auscultation bilaterally, no wheezing, rales or rhonchi. GI : Soft, nontender, nondistended, positive bowel sounds. No hepatomegaly.  Ext: No lower extremity edema  Musculoskeletal: No clubbing or cyanosis, positive pedal pulses. No contracture. ROM intact  Neuro: no acute focal neurological deficits, strength 5/5 upper and lower extremities bilaterally Psych: alert and oriented x 3, normal mood and affect Skin: no rashes or lesions, warm and dry   Data Reviewed: I have reviewed ED notes, Vitals, Lab results and outpatient records.   Recent Labs  Lab 01/22/23 0809  NA 138  K 4.7  CL 109  CO2 22  GLUCOSE 168*  BUN 16  CREATININE 1.31*  CALCIUM 8.8*   Recent Labs  Lab 01/22/23 0809  WBC 4.9  NEUTROABS 2.4  HGB 14.3  HCT 46.5  MCV 107.1*  PLT 137*    Assessment and Plan Principal Problem:   Acute CVA (cerebrovascular accident) ([place]) -Symptoms ongoing for almost a week, patient on Plavix and statin prior to admission -MRI brain showed acute left posterior MCA territory infarct with mild associated petechial hemorrhage -Admit to [facility] with stroke protocol, obtain 2D echo -Neurology consulted by EDP, CTA head and neck versus MRA.  Neurology -Obtain hemoglobin A1c, lipid panel -Telemetry, serial neurochecks, permissive  hypertension -Patient has passed a bed side swallow screen  Active Problems:   CKD (chronic kidney disease) stage 3b, GFR 30-59 ml/min ([facility]) -Creatinine appears close to baseline, continue to follow    CAD S/P- PCI  (Xience DES) to prox LAD and mid LAD 07/05/13 -Obtain lipid panel, continue Plavix, statin -Obtain 2D echo  Diabetes mellitus type 2 with long-term insulin, CKD 3B ([place]) -Start on carb modified diet, obtain hemoglobin A1c -Placed on moderate sliding scale insulin while inpatient    Advance Care Planning:   Code Status: Full Code, discussed with the patient Consults: Neurology Family Communication: Patient and son at the bedside Severity of Illness:      The appropriate patient status for this patient is INPATIENT. Inpatient status is judged to be reasonable and necessary in order to provide the required intensity of service to ensure the patient's safety. The patient's presenting symptoms, physical exam findings, and initial radiographic and laboratory data in the context of their chronic comorbidities is felt to place them at high risk for further clinical deterioration. Furthermore, it is not anticipated that the patient will be medically stable for discharge from the hospital within 2 midnights of admission.   * I certify that at the point of admission it is my clinical judgment that the patient will require inpatient hospital care spanning beyond 2 midnights from the point of admission due to high intensity of service, high risk for further deterioration and high frequency of surveillance required.*    Author: Estill Cotta, MD 01/22/2023 11:39 AM For on call review [email or web address].

## 2023-01-23 ENCOUNTER — Inpatient Hospital Stay (HOSPITAL_COMMUNITY): Payer: No Typology Code available for payment source

## 2023-01-23 DIAGNOSIS — I6389 Other cerebral infarction: Secondary | ICD-10-CM

## 2023-01-23 DIAGNOSIS — Z8673 Personal history of transient ischemic attack (TIA), and cerebral infarction without residual deficits: Secondary | ICD-10-CM | POA: Diagnosis not present

## 2023-01-23 DIAGNOSIS — I255 Ischemic cardiomyopathy: Secondary | ICD-10-CM

## 2023-01-23 DIAGNOSIS — I639 Cerebral infarction, unspecified: Secondary | ICD-10-CM | POA: Diagnosis not present

## 2023-01-23 LAB — ECHOCARDIOGRAM COMPLETE
AR max vel: 2.08 cm2
AV Area VTI: 2.05 cm2
AV Area mean vel: 2 cm2
AV Mean grad: 2 mmHg
AV Peak grad: 3.5 mmHg
Ao pk vel: 0.93 m/s
Area-P 1/2: 4.74 cm2
Calc EF: 33.3 %
Height: 70 in
S' Lateral: 3.8 cm
Single Plane A2C EF: 34.4 %
Single Plane A4C EF: 32.6 %
Weight: 4112.9 oz

## 2023-01-23 LAB — LIPID PANEL
Cholesterol: 87 mg/dL (ref 0–200)
HDL: 28 mg/dL — ABNORMAL LOW (ref >40)
LDL Cholesterol: 44 mg/dL (ref 0–99)
Total CHOL/HDL Ratio: 3.1 RATIO
Triglycerides: 75 mg/dL (ref <150)
VLDL: 15 mg/dL (ref 0–40)

## 2023-01-23 LAB — GLUCOSE, CAPILLARY
Glucose-Capillary: 70 mg/dL (ref 70–99)
Glucose-Capillary: 119 mg/dL — ABNORMAL HIGH (ref 70–99)
Glucose-Capillary: 99 mg/dL (ref 70–99)
Glucose-Capillary: 149 mg/dL — ABNORMAL HIGH (ref 70–99)

## 2023-01-23 MED ORDER — CLOPIDOGREL BISULFATE 75 MG PO TABS
75.0000 mg | ORAL_TABLET | Freq: Every day | ORAL | Status: DC
  Administered 2023-01-23 – 2023-01-24 (×2): 75 mg via ORAL
  Filled 2023-01-23 (×2): qty 1

## 2023-01-23 NOTE — Evaluation (Signed)
Occupational Therapy Evaluation Patient Details Name: Ralph Short MRN: [id number] DOB: 03/11/1946 Today's Date: 01/23/2023   History of Present Illness Pt is a 77 y/o male presenting on 3/21 with headache, slurred speech for 1 week. MRI brain with acute L posterior MCA territory infarct, adjacent/anterior remote L MCA territory and remote R frontal encephalomalacia. PMH includes: CAD, CKD, DM2, gout, HTN, CVA.   Clinical Impression   PTA patient independent and driving. Admitted for above and limited by problem list below. He demonstrates ability to complete ADLs, transfers and functional mobility with supervision.  Cognitively, he presents with decreased awareness, problem solving, safety awareness, recall, and following multiple step commands.  He is tangential and requires redirection throughout session, his awareness to his deficits are emerging but pt reports his automatic tasks will not be affected (although he cannot complete simple counting backwards without errors, recalling only 1/3 words after delay, and requires min cueing to follow multiple step task in hallway with minimal distractions).  Believe he will best benefit from pill box test to further assess cognition.   Based on performance today, recommend assist with driving, med mgmt, finances and cooking.  Will follow acutely and recommend further rehab after dc to address higher level cognition, safety.      Recommendations for follow up therapy are one component of a multi-disciplinary discharge planning process, led by the attending physician.  Recommendations may be updated based on patient status, additional functional criteria and insurance authorization.   Follow Up Recommendations  Outpatient OT (neuro- higher level cognition)     Assistance Recommended at Discharge Intermittent Supervision/Assistance  Patient can return home with the following A little help with bathing/dressing/bathroom;Assistance with  cooking/housework;Direct supervision/assist for medications management;Direct supervision/assist for financial management;Assist for transportation    Functional Status Assessment  Patient has had a recent decline in their functional status and demonstrates the ability to make significant improvements in function in a reasonable and predictable amount of time.  Equipment Recommendations  None recommended by OT    Recommendations for Other Services       Precautions / Restrictions Precautions Precautions: Fall Precaution Comments: reports fall at home approx 1 month ago Restrictions Weight Bearing Restrictions: No      Mobility Bed Mobility               General bed mobility comments: OOB in recliner    Transfers Overall transfer level: Needs assistance Equipment used: None Transfers: Sit to/from Stand Sit to Stand: Supervision           General transfer comment: supervision for safety      Balance Overall balance assessment: Mild deficits observed, not formally tested                                         ADL either performed or assessed with clinical judgement   ADL Overall ADL's : Needs assistance/impaired     Grooming: Supervision/safety;Standing;Oral care           Upper Body Dressing : Set up;Sitting   Lower Body Dressing: Supervision/safety;Sit to/from stand   Toilet Transfer: Supervision/safety;Ambulation           Functional mobility during ADLs: Supervision/safety       Vision Baseline Vision/History: 1 Wears glasses Patient Visual Report:  (he needs his glasses) Vision Assessment?: No apparent visual deficits Additional Comments: functionally appears [facility]  Perception     Praxis      Pertinent Vitals/Pain Pain Assessment Pain Assessment: No/denies pain     Hand Dominance Right   Extremity/Trunk Assessment Upper Extremity Assessment Upper Extremity Assessment: RUE deficits/detail;LUE  deficits/detail RUE Deficits / Details: grossly WFL MMT, slight decreased coordiation with finger to nose testing but functional RUE Coordination: decreased gross motor LUE Deficits / Details: grossly WFL MMT, slight decreased coordiation with finger to nose testing but functional. Pt able to write name, but decreased legability with last name- pt reports still different than normal. LUE Coordination: decreased gross motor   Lower Extremity Assessment Lower Extremity Assessment: Defer to PT evaluation   Cervical / Trunk Assessment Cervical / Trunk Assessment: Normal   Communication Communication Communication: Expressive difficulties   Cognition Arousal/Alertness: Awake/alert Behavior During Therapy: WFL for tasks assessed/performed Overall Cognitive Status: No family/caregiver present to determine baseline cognitive functioning Area of Impairment: Attention, Memory, Following commands, Safety/judgement, Awareness, Problem solving                   Current Attention Level: Sustained Memory: Decreased short-term memory Following Commands: Follows one step commands consistently, Follows one step commands with increased time, Follows multi-step commands inconsistently Safety/Judgement: Decreased awareness of safety, Decreased awareness of deficits Awareness: Emergent Problem Solving: Slow processing, Difficulty sequencing, Requires verbal cues General Comments: Pt tangential, requires redirection with decreased recall, sequencing, difficulty with mulitple step commands and dual cognitive tasks. Pt unable to sequence months backwards, makes 1 error when counting backwards, several errors when counting backwards while walking, slows down and has to stop with no awareness to deficits.  He is only able to recall 1/3 words provided. He reports no concerns with returning to his job, as it is automatic.  He is agreeable to not drive at this time, and he is demonstrating some emergent  awareness to deficits and safety.     General Comments  lengthy discussion on safety, pt agreeable to have assistance with driving    Exercises     Shoulder Instructions      Home Living Family/patient expects to be discharged to:: Private residence Living Arrangements: Children (sons) Available Help at Discharge: Family;Available 24 hours/day Type of Home: House Home Access: Ramped entrance     Home Layout: Multi-level;Able to live on main level with bedroom/bathroom     Bathroom Shower/Tub: Hospital doctor Toilet: Handicapped height     Home Equipment: Conservation officer, nature (2 wheels);Shower seat;Wheelchair - manual          Prior Functioning/Environment Prior Level of Function : Independent/Modified Independent;Driving;Working/employed                        OT Problem List: Decreased activity tolerance;Decreased coordination;Decreased cognition;Decreased safety awareness;Decreased knowledge of use of DME or AE;Decreased knowledge of precautions      OT Treatment/Interventions: Self-care/ADL training;Neuromuscular education;DME and/or AE instruction;Therapeutic activities;Cognitive remediation/compensation;Patient/family education;Balance training    OT Goals(Current goals can be found in the care plan section) Acute Rehab OT Goals Patient Stated Goal: home OT Goal Formulation: With patient Time For Goal Achievement: 02/06/23 Potential to Achieve Goals: Good  OT Frequency: Min 2X/week    Co-evaluation              AM-PAC OT "6 Clicks" Daily Activity     Outcome Measure Help from another person eating meals?: None Help from another person taking care of personal grooming?: A Little Help from another person toileting, which includes  using toliet, bedpan, or urinal?: A Little Help from another person bathing (including washing, rinsing, drying)?: A Little Help from another person to put on and taking off regular upper body clothing?: A  Little Help from another person to put on and taking off regular lower body clothing?: A Little 6 Click Score: 19   End of Session Nurse Communication: Mobility status  Activity Tolerance: Patient tolerated treatment well Patient left: in chair;with call bell/phone within reach;with chair alarm set  OT Visit Diagnosis: Other symptoms and signs involving cognitive function;Other abnormalities of gait and mobility (R26.89)                Time: [id number] OT Time Calculation (min): 29 min Charges:  OT General Charges $OT Visit: 1 Visit OT Evaluation $OT Eval Moderate Complexity: 1 Mod OT Treatments $Self Care/Home Management : 8-22 mins  Jolaine Artist, OT Acute Rehabilitation Services Office [phone]   Delight Stare 01/23/2023, 12:04 PM

## 2023-01-23 NOTE — Evaluation (Signed)
Speech Language Pathology Evaluation Patient Details Name: Ralph Short MRN: [id number] DOB: Sep 06, 1946 Today's Date: 01/23/2023 Time: [id number] SLP Time Calculation (min) (ACUTE ONLY): 27 min  Problem List:  Patient Active Problem List   Diagnosis Date Noted   Acute CVA (cerebrovascular accident) ([place]) 01/22/2023   Right sided weakness 03/03/2017   Cerebral embolism with cerebral infarction 03/03/2017   Acute ischemic stroke ([place]) 03/03/2017   Diabetes mellitus with complication (HCC)    Intracranial hemorrhage following injury, anterior right frontal lobe 09/07/13 09/13/2013   Long term (current) use of anticoagulants 07/15/2013   Anticoagulated on Coumadin for LVT 07/08/2013   Cardiomyopathy, ischemic - s/p Anterior STEMI, EF 25-30% [id number]   Acute systolic HF (heart failure) - s/p Anterior STEMI 08-03-2013   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 of LAD  07/05/2013   CAD S/P- PCI  (Xience DES) to prox LAD and mid LAD 07/05/13 07/05/2013   Gout 07/05/2013   DM (diabetes mellitus), type 2, uncontrolled, recently began insulin 07/05/2013   HTN (hypertension) 07/05/2013   History of agent Orange exposure 07/05/2013   Past Medical History:  Past Medical History:  Diagnosis Date   CAD S/P percutaneous coronary angioplasty - PCI LAD LAD (Xience DES) to prox LAD and mid LAD 07/05/13 07/05/2013   CKD (chronic kidney disease) stage 3, GFR 30-59 ml/min ([place]) 07/06/2013   Diabetes mellitus without complication ([place])    Diverticulitis    Diverticulitis    DM (diabetes mellitus), type 2, uncontrolled, recently began insulin 07/05/2013   Gout 07/05/2013   History of agent Orange exposure 07/05/2013   Hypertension    LV dysfunction, s/p MI, 07/05/13 07/05/2013   Presence of drug coated  stent in LAD coronary artery 07/05/2013   2 Xience Xpedition DES to prox & mid LAD -- in STEMI    STEMI (ST elevation myocardial infarction)of ANT wall-total occ. of LAD  07/05/2013   Past Surgical History:  Past Surgical History:  Procedure Laterality Date   COLON SURGERY     CORONARY ANGIOPLASTY WITH STENT PLACEMENT  07/05/13   emergency PCI and stenting in the setting of anterior STEMI a total proximal LAD with excellent angiographic result and a door to balloon time of 25 minutes.    KNEE SURGERY     LEFT HEART CATHETERIZATION WITH CORONARY ANGIOGRAM N/A 07/05/2013   Procedure: LEFT HEART CATHETERIZATION WITH CORONARY ANGIOGRAM;  Surgeon: Lorretta Harp, MD;  Location[facility] CATH LAB;  Service: Cardiovascular;  Laterality: N/A;   TEE WITHOUT CARDIOVERSION N/A 03/06/2017   Procedure: TRANSESOPHAGEAL ECHOCARDIOGRAM (TEE);  Surgeon: Dorothy Spark, MD;  Location: [facility] ENDOSCOPY;  Service: Cardiovascular;  Laterality: N/A;   HPI:  Pt is a 77 y/o male presenting on 3/21 with headache, slurred speech for 1 week. MRI brain with acute L posterior MCA territory infarct, adjacent/anterior remote L MCA territory and remote R frontal encephalomalacia. PMH includes: CAD, CKD, DM2, gout, HTN, CVA.   Assessment / Plan / Recommendation Clinical Impression  Pt's language and speech are within functional limits. Pt lives with his sons but is independent and works for [employer]. Baseline cognitive deficits are suspected as pt states he had a stroke in 2018 and had to "learn to do a lot" and pt stated his doctor perscribed medicine for memory which he declined  to take.He scored a 15/30 on the SLUMS signifying significant cognitive impairments. He has decreased awareness of deficits and feels he has no impairments in cognition. He was impulsive with his responses and also demnstrated decreased problem solving, word recall (storage and retrieval). Results were discussed with pt and son and recomendation for further ST  however pt declined stating that he has his trust in God. He feels that he has improved from his stroke in 2018 and from several days ago when he had trouble talking. Pt described that what we are testing does not seem important to him and that he will "be able to work it out". Recommended to son to supervise pt with management of medication and finances who agreed, Given he has declined further services, ST will sign off.    SLP Assessment  SLP Recommendation/Assessment:  (needs further ST but declines services) SLP Visit Diagnosis: Cognitive communication deficit (R41.841)    Recommendations for follow up therapy are one component of a multi-disciplinary discharge planning process, led by the attending physician.  Recommendations may be updated based on patient status, additional functional criteria and insurance authorization.    Follow Up Recommendations  Home health SLP (pt declines services)    Assistance Recommended at Discharge     Functional Status Assessment Patient has had a recent decline in their functional status and demonstrates the ability to make significant improvements in function in a reasonable and predictable amount of time.  Frequency and Duration           SLP Evaluation Cognition  Overall Cognitive Status: Difficult to assess (son did not state whether cognition is changed but agrees he is better than several days ago) Arousal/Alertness: Awake/alert Orientation Level: Oriented to person;Oriented to place;Oriented to time;Oriented to situation Year: 2024 Day of Week: Correct Attention: Sustained Sustained Attention: Impaired Sustained Attention Impairment: Verbal basic Memory: Impaired Memory Impairment: Retrieval deficit;Storage deficit (recalled one of 5 words) Awareness: Impaired Awareness Impairment: Intellectual impairment;Emergent impairment;Anticipatory impairment Problem Solving: Impaired Problem Solving Impairment: Verbal basic;Functional  basic Behaviors: Impulsive Safety/Judgment: Impaired       Comprehension  Auditory Comprehension Overall Auditory Comprehension: Appears within functional limits for tasks assessed Visual Recognition/Discrimination Discrimination: Not tested Reading Comprehension Reading Status: Not tested    Expression Expression Primary Mode of Expression: Verbal Verbal Expression Overall Verbal Expression: Appears within functional limits for tasks assessed Pragmatics: No impairment Written Expression Dominant Hand: Right Written Expression: Not tested   Oral / Motor  Oral Motor/Sensory Function Overall Oral Motor/Sensory Function: Within functional limits Motor Speech Overall Motor Speech: Appears within functional limits for tasks assessed Respiration: Within functional limits Phonation: Normal Resonance: Within functional limits Articulation: Within functional limitis Intelligibility: Intelligible Motor Planning: Witnin functional limits            Houston Siren 01/23/2023, 12:50 PM

## 2023-01-23 NOTE — Hospital Course (Signed)
Ralph Short is a 77 y.o. male with PMH significant of CAD, CKD stage III, diabetes mellitus type 2, IDDM, gout, HTN, history of systolic CHF presented to ED with symptoms of headaches, slurred speech since Thursday, 01/22/2023.  Found to have an acute CVA.  Neurology was consulted.  Currently awaiting echocardiogram with contrast to rule out apical thrombus.

## 2023-01-23 NOTE — Progress Notes (Signed)
[facility] Progress Note Patient: Ralph Short [id number] DOB: 1946/02/04 DOA: 01/22/2023  DOS: the patient was seen and examined on 01/23/2023  Brief hospital course: Ralph Short is a 78 y.o. male with PMH significant of CAD, CKD stage III, diabetes mellitus type 2, IDDM, gout, HTN, history of systolic CHF presented to ED with symptoms of headaches, slurred speech since Thursday, 01/22/2023.  Found to have an acute CVA.  Neurology was consulted.  Currently awaiting echocardiogram with contrast to rule out apical thrombus. Assessment and Plan: Acute left MCA infarct. History of apical mural thrombus. Presents with complaints of headache and slurred speech. Speech appears to have improved. CT head shows evidence of acute left MCA infarct. MRI brain confirms acute left MCA infarct without any hemorrhage. CTA head and neck without any large vessel occlusion. Echocardiogram shows improving EF of 35% but unable to rule out an apical thrombus. LDL 44.  Hemoglobin A1c currently pending.  Patient is not agreeable to take aspirin right now and would like to talk with his PCP before starting that medicine. Agreeable for Plavix for now. Initially was not agreeable for further workup or therapy changes but now agree to further workup as well as any change in his medications. PT OT recommends outpatient OT.  HTN. Blood pressure improving. On losartan and Coreg at home. Monitor for now.  History of apical mural thrombus. History of nonischemic cardiomyopathy. Chronic systolic CHF. No volume overload right now. Will resume Coreg and losartan at the time of the discharge. EF appears to be stable or improving. Was on Coumadin in the past but currently off of anticoagulation. Will need to rule out apical thrombus.  HLD. On statin at home.  Continue.  Type 2 diabetes mellitus, well-controlled without any hypoglycemia with long-term insulin use. Home diabetes regimen include insulin  and glipizide. Hemoglobin A1c pending. Monitor.  Obesity. Body mass index is 36.88 kg/m.  Placing the patient at high risk for poor outcome.  CAD. On Plavix. Monitor.  CKD stage IIIa Baseline serum creatinine appears to be around 1.5. Current serum creatinine 1.3. Monitor.  Subjective: Denies any acute complaint. Symptoms appear to have improved significantly.  No nausea or vomiting.  Physical Exam: General: in Mild distress, No Rash Cardiovascular: S1 and S2 Present, No Murmur Respiratory: Good respiratory effort, Bilateral Air entry present. No Crackles, No wheezes Abdomen: Bowel Sound present, No tenderness Extremities: No edema Neuro: Alert and oriented x3, no new focal deficit  Data Reviewed: I have Reviewed nursing notes, Vitals, and Lab results. Since last encounter, pertinent lab results CBC and BMP   . I have ordered test including CBC and BMP  . I have discussed pt's care plan and test results with neurology  . I have ordered imaging echocardiogram with contrast  .   Disposition: Status is: Inpatient Remains inpatient appropriate because: Further stroke workup needed  enoxaparin (LOVENOX) injection 30 mg Start: 01/22/23 2200   Family Communication: No one at bedside Level of care: Telemetry Cardiac   Vitals:   01/23/23 0305 01/23/23 0855 01/23/23 1220 01/23/23 1506  BP: (!) 144/88 (!) 168/77 (!) 157/90 (!) 153/87  Pulse: 80 84 95 87  Resp: 17 18 18 20   Temp: 98.7 F (37.1 C) 98.6 F (37 C) 98.2 F (36.8 C) 98.4 F (36.9 C)  TempSrc: Oral Oral Oral Oral  SpO2: 98% 98% 98% 98%  Weight:      Height:         Author: Berle Mull, MD 01/23/2023  8:09 PM  Please look on [email or web address] to find out who is on call.

## 2023-01-23 NOTE — Evaluation (Signed)
Physical Therapy Evaluation Patient Details Name: Ralph Short MRN: [id number] DOB: 23-Apr-1946 Today's Date: 01/23/2023  History of Present Illness  Pt is a 77 y/o male presenting on 3/21 with headache, slurred speech for 1 week. MRI brain with acute L posterior MCA territory infarct, adjacent/anterior remote L MCA territory and remote R frontal encephalomalacia. PMH includes: CAD, CKD, DM2, gout, HTN, CVA.   Clinical Impression  Pt admitted with above diagnosis. PTA pt lived at home with sons, mod I/I mobility and ADLs, driving.  Pt currently with functional limitations due to the deficits listed below (see PT Problem List). ON eval, pt mod I bed mobility, supervision transfers, supervision amb 350' without AD, and supervision ascend/descend 5 steps with bilat rails. BLE strength intact and symmetrical. Pt will benefit from acute skilled PT to increase their independence and safety with mobility to allow discharge.  PT to follow acutely. No follow up services indicated.        Recommendations for follow up therapy are one component of a multi-disciplinary discharge planning process, led by the attending physician.  Recommendations may be updated based on patient status, additional functional criteria and insurance authorization.  Follow Up Recommendations No PT follow up      Assistance Recommended at Discharge PRN  Patient can return home with the following       Equipment Recommendations None recommended by PT  Recommendations for Other Services       Functional Status Assessment Patient has had a recent decline in their functional status and demonstrates the ability to make significant improvements in function in a reasonable and predictable amount of time.     Precautions / Restrictions Precautions Precautions: Fall Precaution Comments: reports fall at home approx 1 month ago      Mobility  Bed Mobility Overal bed mobility: Modified Independent                   Transfers Overall transfer level: Needs assistance Equipment used: None Transfers: Sit to/from Stand Sit to Stand: Supervision           General transfer comment: supervision for safety    Ambulation/Gait Ambulation/Gait assistance: Supervision Gait Distance (Feet): 350 Feet Assistive device: None Gait Pattern/deviations: Step-through pattern, Decreased stride length Gait velocity: decreased Gait velocity interpretation: 1.31 - 2.62 ft/sec, indicative of limited community ambulator   General Gait Details: supervision for safety, no LOB noted  Stairs Stairs: Yes Stairs assistance: Supervision Stair Management: Two rails, Step to pattern, Forwards Number of Stairs: 5    Wheelchair Mobility    Modified Rankin (Stroke Patients Only) Modified Rankin (Stroke Patients Only) Pre-Morbid Rankin Score: No symptoms Modified Rankin: Moderate disability     Balance Overall balance assessment: Mild deficits observed, not formally tested, History of Falls                                           Pertinent Vitals/Pain Pain Assessment Pain Assessment: No/denies pain    Home Living Family/patient expects to be discharged to:: Private residence Living Arrangements: Children (sons) Available Help at Discharge: Family;Available 24 hours/day Type of Home: House Home Access: Ramped entrance       Home Layout: Multi-level;Able to live on main level with bedroom/bathroom Home Equipment: Rolling Walker (2 wheels);Shower seat;Wheelchair - manual      Prior Function Prior Level of Function : Independent/Modified Independent;Driving  Hand Dominance   Dominant Hand: Right    Extremity/Trunk Assessment   Upper Extremity Assessment Upper Extremity Assessment: Defer to OT evaluation    Lower Extremity Assessment Lower Extremity Assessment: Overall WFL for tasks assessed    Cervical / Trunk Assessment Cervical / Trunk  Assessment: Normal  Communication   Communication: Expressive difficulties  Cognition Arousal/Alertness: Awake/alert Behavior During Therapy: WFL for tasks assessed/performed Overall Cognitive Status: Within Functional Limits for tasks assessed                                 General Comments: Tangential. Reports recent episode at home when he cooked dinner at 7:30am thinking it was 7:30pm. Basic cognition intact during PT eval.        General Comments General comments (skin integrity, edema, etc.): VSS on RA    Exercises     Assessment/Plan    PT Assessment Patient needs continued PT services  PT Problem List Decreased balance;Decreased activity tolerance;Decreased mobility       PT Treatment Interventions Functional mobility training;Balance training;Patient/family education;Gait training;Therapeutic activities;Stair training;Therapeutic exercise    PT Goals (Current goals can be found in the Care Plan section)  Acute Rehab PT Goals Patient Stated Goal: home. Continue going to nascar races. PT Goal Formulation: With patient Time For Goal Achievement: 02/06/23 Potential to Achieve Goals: Good    Frequency Min 4X/week     Co-evaluation               AM-PAC PT "6 Clicks" Mobility  Outcome Measure Help needed turning from your back to your side while in a flat bed without using bedrails?: None Help needed moving from lying on your back to sitting on the side of a flat bed without using bedrails?: None Help needed moving to and from a bed to a chair (including a wheelchair)?: A Little Help needed standing up from a chair using your arms (e.g., wheelchair or bedside chair)?: A Little Help needed to walk in hospital room?: A Little Help needed climbing 3-5 steps with a railing? : A Little 6 Click Score: 20    End of Session Equipment Utilized During Treatment: Gait belt Activity Tolerance: Patient tolerated treatment well Patient left: in chair;with  call bell/phone within reach;with chair alarm set Nurse Communication: Mobility status PT Visit Diagnosis: Difficulty in walking, not elsewhere classified (R26.2)    Time: [id number] PT Time Calculation (min) (ACUTE ONLY): 23 min   Charges:   PT Evaluation $PT Eval Moderate Complexity: 1 Mod PT Treatments $Gait Training: 8-22 mins        Gloriann Loan., PT  Office # [phone]   Lorriane Shire 01/23/2023, 8:44 AM

## 2023-01-23 NOTE — TOC Initial Note (Signed)
Transition of Care [facility]) - Initial/Assessment Note    Patient Details  Name: Ralph Short MRN: [id number] Date of Birth: 21-Mar-1946  [facility] CM/SW Contact:    Marilu Favre, RN Phone Number: 01/23/2023, 1:04 PM  Clinical Narrative:                  Spoke to patient and son he lives with at bedside.   OT recommending OP OT . Patient states he has already talked to [facility] and he can receive OP OT there. [facility] Dr Floria Raveling fax [phone]   OT secure chatted NCM following message : Please reinforce with family that he needs assistance with med mgmt, fiances, cooking and driving   NCM passed message onto patient's son who lives with patient.  Expected Discharge Plan: Home/Self Care Barriers to Discharge: Continued Medical Work up   Patient Goals and CMS Choice Patient states their goals for this hospitalization and ongoing recovery are:: to return to home     [place] ownership interest in [facility].provided to:: Patient    Expected Discharge Plan and Services   Discharge Planning Services: CM Consult   Living arrangements for the past 2 months: Single Family Home                 DME Arranged: N/A DME Agency: NA       HH Arranged: NA          Prior Living Arrangements/Services Living arrangements for the past 2 months: Single Family Home Lives with:: Adult Children Patient language and need for interpreter reviewed:: Yes        Need for Family Participation in Patient Care: Yes (Comment) Care giver support system in place?: Yes (comment)   Criminal Activity/Legal Involvement Pertinent to Current Situation/Hospitalization: No - Comment as needed  Activities of Daily Living Home Assistive Devices/Equipment: Eyeglasses, Walker (specify type), Dentures (specify type) ADL Screening (condition at time of admission) Patient's cognitive ability adequate to  safely complete daily activities?: Yes Is the patient deaf or have difficulty hearing?: No Does the patient have difficulty seeing, even when wearing glasses/contacts?: No Does the patient have difficulty concentrating, remembering, or making decisions?: No Patient able to express need for assistance with ADLs?: Yes Does the patient have difficulty dressing or bathing?: No Independently performs ADLs?: Yes (appropriate for developmental age) Does the patient have difficulty walking or climbing stairs?: Yes Weakness of Legs: Both Weakness of Arms/Hands: Both  Permission Sought/Granted   Permission granted to share information with : Yes, Verbal Permission Granted  Share Information with NAME: so at bedside           Emotional Assessment Appearance:: Appears stated age Attitude/Demeanor/Rapport: Engaged Affect (typically observed): Accepting Orientation: : Oriented to Self, Oriented to Place, Oriented to  Time, Oriented to Situation Alcohol / Substance Use: Not Applicable Psych Involvement: No (comment)  Admission diagnosis:  Acute ischemic left MCA stroke (HCC) [I63.512] Acute CVA (cerebrovascular accident) [facility]) [I63.9] Patient Active Problem List   Diagnosis Date Noted   Acute CVA (cerebrovascular accident) ([place]) 01/22/2023   Right sided weakness 03/03/2017   Cerebral embolism with cerebral infarction 03/03/2017   Acute ischemic stroke ([place]) 03/03/2017   Diabetes mellitus with complication ([place])    Intracranial hemorrhage following injury, anterior right frontal lobe 09/07/13 09/13/2013   Long term (current) use of anticoagulants 07/15/2013   Anticoagulated on Coumadin for  LVT 07/08/2013   Cardiomyopathy, ischemic - s/p Anterior STEMI, EF 25-30% [id number]   Acute systolic HF (heart failure) - s/p Anterior STEMI 07-31-13   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 of LAD  07/05/2013   CAD S/P- PCI  (Xience DES) to prox LAD and mid LAD 07/05/13 07/05/2013   Gout 07/05/2013   DM (diabetes mellitus), type 2, uncontrolled, recently began insulin 07/05/2013   HTN (hypertension) 07/05/2013   History of agent Orange exposure 07/05/2013   PCP:  Clinic, [place]:   [place], [place] [facility] [place] [address] [place] [address] Phone: [phone] Fax: [phone]     Social Determinants of Health (SDOH) Social History: SDOH Screenings   Food Insecurity: No Food Insecurity (01/23/2023)  Housing: Low Risk  (01/23/2023)  Transportation Needs: No Transportation Needs (01/23/2023)  Utilities: Not At Risk (01/23/2023)  Tobacco Use: Medium Risk (01/22/2023)   SDOH Interventions:     Readmission Risk Interventions     No data to display

## 2023-01-23 NOTE — Progress Notes (Signed)
  Echocardiogram 2D Echocardiogram has been performed.  Aleesa Sweigert Renold Don 01/23/2023, 4:22 PM

## 2023-01-23 NOTE — Progress Notes (Addendum)
STROKE TEAM PROGRESS NOTE   INTERVAL HISTORY Patient is sitting in the chair in no apparent distress.  No family at the bedside he states most of his symptoms have resolved he sometimes has some trouble with word finding but his slurred speech and unstable gait has improved  Vitals:   01/22/23 2315 01/23/23 0305 01/23/23 0855 01/23/23 1220  BP: 135/79 (!) 144/88 (!) 168/77 (!) 157/90  Pulse: 89 80 84 95  Resp: 17 17 18 18   Temp: 98.1 F (36.7 C) 98.7 F (37.1 C) 98.6 F (37 C) 98.2 F (36.8 C)  TempSrc: Oral Oral Oral Oral  SpO2: 97% 98% 98% 98%  Weight:      Height:       CBC:  Recent Labs  Lab 01/22/23 0809 01/22/23 1455  WBC 4.9 5.3  NEUTROABS 2.4  --   HGB 14.3 14.3  HCT 46.5 44.2  MCV 107.1* 99.8  PLT 137* [id number]*   Basic Metabolic Panel:  Recent Labs  Lab 01/22/23 0809 01/22/23 1455  NA 138  --   K 4.7  --   CL 109  --   CO2 22  --   GLUCOSE 168*  --   BUN 16  --   CREATININE 1.31* 1.25*  CALCIUM 8.8*  --    Lipid Panel:  Recent Labs  Lab 01/23/23 0339  CHOL 87  TRIG 75  HDL 28*  CHOLHDL 3.1  VLDL 15  LDLCALC 44   HgbA1c: No results for input(s): "HGBA1C" in the last 168 hours. Urine Drug Screen:  Recent Labs  Lab 01/22/23 1110  LABOPIA NONE DETECTED  COCAINSCRNUR NONE DETECTED  LABBENZ NONE DETECTED  AMPHETMU NONE DETECTED  THCU NONE DETECTED  LABBARB NONE DETECTED    Alcohol Level  Recent Labs  Lab 01/22/23 0809  ETH <10    IMAGING past 24 hours CT ANGIO HEAD NECK W WO CM  Result Date: 01/22/2023 CLINICAL DATA:  Follow-up examination for stroke. EXAM: CT ANGIOGRAPHY HEAD AND NECK TECHNIQUE: Multidetector CT imaging of the head and neck was performed using the standard protocol during bolus administration of intravenous contrast. Multiplanar CT image reconstructions and MIPs were obtained to evaluate the vascular anatomy. Carotid stenosis measurements (when applicable) are obtained utilizing NASCET criteria, using the distal internal  carotid diameter as the denominator. RADIATION DOSE REDUCTION: This exam was performed according to the departmental dose-optimization program which includes automated exposure control, adjustment of the mA and/or kV according to patient size and/or use of iterative reconstruction technique. CONTRAST:  1mL OMNIPAQUE IOHEXOL 350 MG/ML SOLN COMPARISON:  Prior studies from earlier the same day. FINDINGS: CT HEAD FINDINGS Brain: Age-related cerebral atrophy. Multifocal chronic cortical infarcts noted, better seen on prior brain MRI. Evolving acute left posterior MCA distribution infarct with associated petechial blood products again noted, stable from prior MRI. No significant mass effect. No other acute intracranial hemorrhage or large vessel territory infarct. No mass lesion or midline shift. No hydrocephalus or extra-axial fluid collection. Vascular: No abnormal hyperdense vessel. Skull: Scalp soft tissues and calvarium demonstrate no new finding. Sinuses/Orbits: Globes and orbital soft tissues within normal limits. Paranasal sinuses and mastoid air cells remain clear. Other: None. Review of the MIP images confirms the above findings CTA NECK FINDINGS Aortic arch: Visualized aortic arch normal in caliber. Bovine branching pattern noted. No stenosis about the origin of the great vessels. Right carotid system: Right common and internal carotid arteries are patent without stenosis, dissection or occlusion. Left carotid system: Left common  and internal carotid arteries are patent without dissection. Minor atheromatous change about the left carotid bulb without stenosis. Vertebral arteries: Both vertebral arteries arise from the subclavian arteries. No proximal subclavian artery stenosis. Both vertebral arteries widely patent without stenosis, dissection or occlusion. Skeleton: No discrete or worrisome osseous lesions. 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 Other neck: Small amount of secretions noted within  the subglottic airway. No other acute soft tissue abnormality within the neck. Upper chest: Visualized upper chest demonstrates no acute finding. Review of the MIP images confirms the above findings CTA HEAD FINDINGS Anterior circulation: Both internal carotid arteries are widely patent to the termini without stenosis. A1 segments, anterior communicating complex common anterior cerebral arteries are widely patent without stenosis. No M1 stenosis or occlusion. No proximal MCA branch occlusion or high-grade stenosis. Distal MCA branches are perfused and symmetric. Posterior circulation: Both V4 segments widely patent without stenosis. Left PICA patent. Right PICA origin not well seen. Basilar widely patent without stenosis. Superior cerebellar and posterior cerebral arteries patent bilaterally. Venous sinuses: Grossly patent allowing for timing the contrast bolus. Anatomic variants: None significant.  No aneurysm. Review of the MIP images confirms the above findings IMPRESSION: CT HEAD IMPRESSION: 1. Evolving acute left posterior MCA distribution infarct with associated petechial blood products, stable from prior MRI. No significant mass effect. 2. No other new acute intracranial abnormality. 3. Underlying atrophy with multiple chronic ischemic infarcts, better seen on prior brain MRI. CTA HEAD AND NECK IMPRESSION: 1. Negative CTA for large vessel occlusion or other emergent finding. 2. Minimal atheromatous change about the left carotid bulb without stenosis. Otherwise wide patency of the major arterial vasculature of the head and neck. No other significant atheromatous disease. No hemodynamically significant or correctable stenosis. Electronically Signed   By: Jeannine Boga M.D.   On: 01/22/2023 21:09    PHYSICAL EXAM  Temp:  [98.1 F (36.7 C)-98.7 F (37.1 C)] 98.2 F (36.8 C) (03/22 1220) Pulse Rate:  [80-95] 95 (03/22 1220) Resp:  [17-18] 18 (03/22 1220) BP: (118-168)/(70-90) 157/90 (03/22  1220) SpO2:  [97 %-98 %] 98 % (03/22 1220)  General - Well nourished, well developed, in no apparent distress. Cardiovascular - Regular rhythm and rate.  Mental Status -  Level of arousal and orientation to time, place, and person were intact. Language including expression, naming, repetition, comprehension was assessed and found intact. Attention span and concentration were normal. Recent and remote memory were intact. Fund of Knowledge was assessed and was intact.  Cranial Nerves II - XII - II - Visual field intact OU. III, IV, VI - Extraocular movements intact. V - Facial sensation intact bilaterally. VII -slight right facial droop however does not have his top teeth and VIII - Hearing & vestibular intact bilaterally. X - Palate elevates symmetrically. XI - Chin turning & shoulder shrug intact bilaterally. XII - Tongue protrusion intact.  Motor Strength - The patient's strength was normal in all extremities and pronator drift was absent.  Bulk was normal and fasciculations were absent.   Motor Tone - Muscle tone was assessed at the neck and appendages and was normal. Sensory - Light touch, temperature/pinprick were assessed and were symmetrical.    Coordination - The patient had normal movements in the hands and feet with no ataxia or dysmetria.  Tremor was absent.  Gait and Station - deferred.  ASSESSMENT/PLAN Mr. COLBEN DENNO is a 77 y.o. male with history of  CAD, CKD stage III, diabetes mellitus type 2,  IDDM, gout, HTN, history of systolic CHF presented to ED with symptoms of headaches, slurred speech which started 1 week ago.  Also has a history of prior stroke in the left hemisphere with no residual deficits that happened in 2018.  Presented to [facility] after 1 week of word finding difficulties and then continued to have some slurred speech and some difficulty walking  Stroke: Acute left MCA ischemic infarct, likely due to cardiomyopathy with low EF, need to rule out LV  thrombus Code Stroke  CT head acute left MCA ischemic infarct parenchymal atrophy, chronic small vessel ischemia disease and chronic infarcts CTA head & neck no LVO MRI acute left MCA posterior ischemic infarct 2D Echo EF 30 to 35% 2D echo with contrast pending LDL 44 HgbA1c pending UDS negative VTE prophylaxis -Lovenox Plavix prior to admission, now on Plavix.  Has an allergy to aspirin so we will continue Plavix as monotherapy.  However if LV thrombus found, he needs to be on anticoagulation. Therapy recommendations: Outpatient OT Disposition: Pending  History of stroke  History of cardiomyopathy with apical mural thrombus EF of 25 to 30% in 2014 after MRI status post stenting, put on Coumadin 03/2017 admitted for right-sided weakness and aphasia.  CT no acute abnormality but old right frontal and left occipital infarcts.  MRI showed left frontal infarct.  MRI head and neck negative.  EF 25 to 30%.  LDL 56, A1c 8.7.  IR 0.97.  However repeat TEE showed no LV thrombus.  Coumadin discontinued and put on aspirin. Later on patient not tolerating aspirin, changed to Plavix.  Hypertension Home meds: Coreg 6.25 mg, losartan 25 mg Stable Long-term BP goal normotensive  Hyperlipidemia Home meds: Atorvastatin 80, resumed in hospital LDL 44, goal < 70 Continue statin at discharge  Diabetes type II Controlled Home meds: Glipizide, insulin HgbA1c pending, goal < 7.0 CBGs SSI Close PCP follow-up at the [place]  Other Stroke Risk Factors Advanced Age >/= 94  Obesity, Body mass index is 36.88 kg/m., BMI >/= 30 associated with increased stroke risk, recommend weight loss, diet and exercise as appropriate  Coronary artery disease/MI status post stenting  Other Active Problems CKD stage III, creatinine 1.31-1.25 Gout History of traumatic [facility] day # [place] DNP, ACNPC-AG  [facility]  ATTENDING NOTE: I reviewed above note and agree with the assessment and  plan. Pt was seen and examined.   No family at bedside.  Patient lying bed, currently neuro intact, no focal deficit.  However, patient does have left MCA infarct on CT and MRI.  CTA neck negative.  EF showed 30 to 35%, however difficult evaluating LV thrombus without contrast.  Ordered 2D echo with contrast in AM.  If LV thrombus found, may need anticoagulation.  Currently continue Plavix and statin.  Aggressive stroke factor modification.  [facility]  For detailed assessment and plan, please refer to above/below as I have made changes wherever appropriate.   Rosalin Hawking, MD PhD Stroke Neurology 01/23/2023 9:20 PM    To contact Stroke Continuity provider, please refer to [email or web address] After hours, contact General Neurology

## 2023-01-24 ENCOUNTER — Inpatient Hospital Stay (HOSPITAL_COMMUNITY): Payer: No Typology Code available for payment source

## 2023-01-24 DIAGNOSIS — I236 Thrombosis of atrium, auricular appendage, and ventricle as current complications following acute myocardial infarction: Secondary | ICD-10-CM

## 2023-01-24 DIAGNOSIS — I639 Cerebral infarction, unspecified: Secondary | ICD-10-CM | POA: Diagnosis not present

## 2023-01-24 LAB — CBC
HCT: 42 % (ref 39.0–52.0)
Hemoglobin: 14.4 g/dL (ref 13.0–17.0)
MCH: 34 pg (ref 26.0–34.0)
MCHC: 34.3 g/dL (ref 30.0–36.0)
MCV: 99.3 fL (ref 80.0–100.0)
Platelets: 137 10*3/uL — ABNORMAL LOW (ref 150–400)
RBC: 4.23 MIL/uL (ref 4.22–5.81)
RDW: 13.1 % (ref 11.5–15.5)
WBC: 4.6 10*3/uL (ref 4.0–10.5)
nRBC: 0 % (ref 0.0–0.2)

## 2023-01-24 LAB — BASIC METABOLIC PANEL
Anion gap: 9 (ref 5–15)
BUN: 17 mg/dL (ref 8–23)
CO2: 24 mmol/L (ref 22–32)
Calcium: 8.9 mg/dL (ref 8.9–10.3)
Chloride: 104 mmol/L (ref 98–111)
Creatinine, Ser: 1.26 mg/dL — ABNORMAL HIGH (ref 0.61–1.24)
GFR, Estimated: 59 mL/min — ABNORMAL LOW (ref >60)
Glucose, Bld: 134 mg/dL — ABNORMAL HIGH (ref 70–99)
Potassium: 4.3 mmol/L (ref 3.5–5.1)
Sodium: 137 mmol/L (ref 135–145)

## 2023-01-24 LAB — ECHOCARDIOGRAM LIMITED
Height: 70 in
Weight: 4112.9 oz

## 2023-01-24 LAB — GLUCOSE, CAPILLARY
Glucose-Capillary: 119 mg/dL — ABNORMAL HIGH (ref 70–99)
Glucose-Capillary: 106 mg/dL — ABNORMAL HIGH (ref 70–99)

## 2023-01-24 LAB — HEMOGLOBIN A1C
Hgb A1c MFr Bld: 6 % — ABNORMAL HIGH (ref 4.8–5.6)
Mean Plasma Glucose: 126 mg/dL

## 2023-01-24 MED ORDER — APIXABAN 5 MG PO TABS: 5.0000 mg | ORAL_TABLET | Freq: Two times a day (BID) | ORAL | 0 refills | Status: DC

## 2023-01-24 MED ORDER — APIXABAN 5 MG PO TABS: 5.0000 mg | ORAL_TABLET | Freq: Two times a day (BID) | ORAL | Status: DC

## 2023-01-24 MED ORDER — PERFLUTREN LIPID MICROSPHERE
1.0000 mL | INTRAVENOUS | Status: AC | PRN
  Administered 2023-01-24: 4 mL via INTRAVENOUS

## 2023-01-24 MED ORDER — APIXABAN 5 MG PO TABS: 5.0000 mg | ORAL_TABLET | Freq: Two times a day (BID) | ORAL | 0 refills | Status: AC

## 2023-01-24 NOTE — Progress Notes (Signed)
Physical Therapy Treatment Patient Details Name: Ralph Short MRN: [id number] DOB: February 11, 1946 Today's Date: 01/24/2023   History of Present Illness Pt is a 77 y/o male presenting on 3/21 with headache, slurred speech for 1 week. MRI brain with acute L posterior MCA territory infarct, adjacent/anterior remote L MCA territory and remote R frontal encephalomalacia. PMH includes: CAD, CKD, DM2, gout, HTN, CVA.    PT Comments    Pt continues to be able to ambulate and navigate stairs at a supervision level with no LOB, but he does display some mild balance deficits, evident by his intermittent lateral sway/drift. Attempted to improve pt's dual tasking by having pt count backwards by 2s from 20 while ambulating, but pt slowing his gait and making mistakes quickly, eventually declining to attempt further. Pt's primary deficit remains his cognition. Will continue to follow acutely. Current recommendations remain appropriate.    Recommendations for follow up therapy are one component of a multi-disciplinary discharge planning process, led by the attending physician.  Recommendations may be updated based on patient status, additional functional criteria and insurance authorization.  Follow Up Recommendations  No PT follow up     Assistance Recommended at Discharge Intermittent Supervision/Assistance  Patient can return home with the following A little help with bathing/dressing/bathroom;Assistance with cooking/housework;Direct supervision/assist for medications management;Direct supervision/assist for financial management;Assist for transportation;Help with stairs or ramp for entrance   Equipment Recommendations  None recommended by PT    Recommendations for Other Services       Precautions / Restrictions Precautions Precautions: Fall Precaution Comments: reports fall at home approx 1 month ago Restrictions Weight Bearing Restrictions: No     Mobility  Bed Mobility Overal bed mobility:  Modified Independent             General bed mobility comments: HOB elevated, no assistance needed    Transfers Overall transfer level: Needs assistance Equipment used: None Transfers: Sit to/from Stand Sit to Stand: Supervision           General transfer comment: supervision for safety    Ambulation/Gait Ambulation/Gait assistance: Supervision Gait Distance (Feet): 450 Feet Assistive device: None Gait Pattern/deviations: Step-through pattern, Decreased stride length, Wide base of support Gait velocity: decreased Gait velocity interpretation: 1.31 - 2.62 ft/sec, indicative of limited community ambulator   General Gait Details: Pt with slightly widened stance and mild lateral sway/drift, reporting some difficulty with walking due to not having shoes and a hx of his R foot being shorter than his L. No LOB, supervision for safety   Stairs Stairs: Yes Stairs assistance: Supervision Stair Management: Two rails, Step to pattern, Forwards, Alternating pattern, One rail Right Number of Stairs: 5 (x2 standard, x3 short) General stair comments: Ascends and descends with reciprocal stepping with bil rails initially. Repeated x5 steps with only R handrail use and pt slowing down and transitioning to step-to pattern when descending. No LOB, supervision for safety   Wheelchair Mobility    Modified Rankin (Stroke Patients Only) Modified Rankin (Stroke Patients Only) Pre-Morbid Rankin Score: No symptoms Modified Rankin: Moderate disability     Balance Overall balance assessment: Mild deficits observed, not formally tested                                          Cognition Arousal/Alertness: Awake/alert Behavior During Therapy: WFL for tasks assessed/performed Overall Cognitive Status: Impaired/Different from baseline Area of Impairment: Attention,  Memory, Following commands, Safety/judgement, Awareness, Problem solving                   Current  Attention Level: Selective Memory: Decreased short-term memory Following Commands: Follows one step commands consistently, Follows one step commands with increased time, Follows multi-step commands with increased time Safety/Judgement: Decreased awareness of safety, Decreased awareness of deficits Awareness: Emergent Problem Solving: Slow processing, Requires verbal cues General Comments: Pt tangential, requires redirection. Difficulty with dual cognitive tasks. Pt unable to count backwards by 2s, stating "20, 20, 17, oh 20, 18, 17, 15, 12" then declined to attempt further, slowing gait while doing so. Pt did not know his room number initially but was able to find his room without assistance due to his knowledge that his room is at the end of the hall.        Exercises      General Comments        Pertinent Vitals/Pain Pain Assessment Pain Assessment: Faces Faces Pain Scale: No hurt Pain Intervention(s): Monitored during session    Home Living                          Prior Function            PT Goals (current goals can now be found in the care plan section) Acute Rehab PT Goals Patient Stated Goal: home. Continue going to nascar races. PT Goal Formulation: With patient Time For Goal Achievement: 02/06/23 Potential to Achieve Goals: Good Progress towards PT goals: Progressing toward goals    Frequency    Min 4X/week      PT Plan Current plan remains appropriate    Co-evaluation              AM-PAC PT "6 Clicks" Mobility   Outcome Measure  Help needed turning from your back to your side while in a flat bed without using bedrails?: None Help needed moving from lying on your back to sitting on the side of a flat bed without using bedrails?: None Help needed moving to and from a bed to a chair (including a wheelchair)?: A Little Help needed standing up from a chair using your arms (e.g., wheelchair or bedside chair)?: A Little Help needed to walk  in hospital room?: A Little Help needed climbing 3-5 steps with a railing? : A Little 6 Click Score: 20    End of Session Equipment Utilized During Treatment: Gait belt Activity Tolerance: Patient tolerated treatment well Patient left: in chair;with call bell/phone within reach;with chair alarm set   PT Visit Diagnosis: Difficulty in walking, not elsewhere classified (R26.2);Unsteadiness on feet (R26.81)     Time: [id number] PT Time Calculation (min) (ACUTE ONLY): 16 min  Charges:  $Gait Training: 8-22 mins                     Moishe Spice, PT, DPT Acute Rehabilitation Services  Office: [place] 01/24/2023, 10:17 AM

## 2023-01-24 NOTE — Progress Notes (Signed)
STROKE TEAM PROGRESS NOTE   INTERVAL HISTORY Patient is sitting up in the bed in no apparent distress.  No family at the bedside he states most of his symptoms have resolved he sometimes has some trouble with word finding but his slurred speech and unstable gait has improved.  2D echo done today shows normal left thrombus.  Recommend switch to Eliquis and have outpatient follow-up with cardiology  Vitals:   01/23/23 2326 01/24/23 0322 01/24/23 0719 01/24/23 1155  BP: 138/80 125/84 (!) 149/84 (!) 143/87  Pulse: 85 83 78 94  Resp:   20 18  Temp: 98.1 F (36.7 C) 98 F (36.7 C) 98.4 F (36.9 C) 98.9 F (37.2 C)  TempSrc: Oral Oral Oral Oral  SpO2: 99% 97% 98% 100%  Weight:      Height:       CBC:  Recent Labs  Lab 01/22/23 0809 01/22/23 1455 01/24/23 0153  WBC 4.9 5.3 4.6  NEUTROABS 2.4  --   --   HGB 14.3 14.3 14.4  HCT 46.5 44.2 42.0  MCV 107.1* 99.8 99.3  PLT 137* 143* 0000000*   Basic Metabolic Panel:  Recent Labs  Lab 01/22/23 0809 01/22/23 1455 01/24/23 0153  NA 138  --  137  K 4.7  --  4.3  CL 109  --  104  CO2 22  --  24  GLUCOSE 168*  --  134*  BUN 16  --  17  CREATININE 1.31* 1.25* 1.26*  CALCIUM 8.8*  --  8.9   Lipid Panel:  Recent Labs  Lab 01/23/23 0339  CHOL 87  TRIG 75  HDL 28*  CHOLHDL 3.1  VLDL 15  LDLCALC 44   HgbA1c:  Recent Labs  Lab 01/23/23 0339  HGBA1C 6.0*   Urine Drug Screen:  Recent Labs  Lab 01/22/23 1110  LABOPIA NONE DETECTED  COCAINSCRNUR NONE DETECTED  LABBENZ NONE DETECTED  AMPHETMU NONE DETECTED  THCU NONE DETECTED  LABBARB NONE DETECTED    Alcohol Level  Recent Labs  Lab 01/22/23 0809  ETH <10    IMAGING past 24 hours ECHOCARDIOGRAM LIMITED  Result Date: 01/24/2023    ECHOCARDIOGRAM LIMITED REPORT   Patient Name:   Ralph Short Date of Exam: 01/24/2023 Medical Rec #:  [id number]      Height:       70.0 in Accession #:    [id number]     Weight:       257.1 lb Date of Birth:  [birth year]      BSA:           2.322 m Patient Age:    41 years       BP:           149/84 mmHg Patient Gender: M              HR:           84 bpm. Exam Location:  Inpatient Procedure: Limited Echo and Intracardiac Opacification Agent Indications:    left ventricular apical thrombus  History:        Patient has prior history of Echocardiogram examinations, most                 recent 01/23/2023. Cardiomyopathy, CAD, Stroke and chronic kidney                 disease; Risk Factors:Diabetes and Hypertension.  Sonographer:    Johny Chess RDCS Referring Phys: [id number] [place]  1. Definity contrast utilized. Nonmobile apical LV mural thrombus present measuring 1.6 x 0.7 cm.. Left ventricular ejection fraction, by estimation, is 30 to 35%. The left ventricle has moderately decreased function. The left ventricle demonstrates regional wall motion abnormalities (see scoring diagram/findings for description). FINDINGS  Left Ventricle: Definity contrast utilized. Nonmobile apical LV mural thrombus present measuring 1.6 x 0.7 cm. Left ventricular ejection fraction, by estimation, is 30 to 35%. The left ventricle has moderately decreased function. The left ventricle demonstrates regional wall motion abnormalities. Definity contrast agent was given IV to delineate the left ventricular endocardial borders.  LV Wall Scoring: The apical lateral segment, apical septal segment, apical inferior segment, and apex are akinetic. The entire anterior wall, antero-lateral wall, anterior septum, inferior wall, posterior wall, mid inferoseptal segment, and basal inferoseptal segment are hypokinetic. Rozann Lesches MD Electronically signed by Rozann Lesches MD Signature Date/Time: 01/24/2023/12:43:35 PM    Final    ECHOCARDIOGRAM COMPLETE  Result Date: 01/23/2023    ECHOCARDIOGRAM REPORT   Patient Name:   Ralph Short Date of Exam: 01/23/2023 Medical Rec #:  [id number]      Height:       70.0 in Accession #:    [id number]     Weight:       257.1  lb Date of Birth:  September 05, 1946      BSA:          2.322 m Patient Age:    77 years       BP:           153/87 mmHg Patient Gender: M              HR:           84 bpm. Exam Location:  Inpatient Procedure: 2D Echo, Cardiac Doppler and Color Doppler Indications:    Stroke  History:        Patient has prior history of Echocardiogram examinations, most                 recent 03/04/2017. CAD; Risk Factors:Diabetes.  Sonographer:    Marella Chimes Referring Phys: [id number] RIPUDEEP K RAI  Sonographer Comments: Suboptimal parasternal window and patient is obese. Image acquisition challenging due to patient body habitus. IMPRESSIONS  1. Limited echo windows- Definity contrast would be helpful for better endocardial definition.  2. Apex could not be adequately evaluated to r/o thrombus. Left ventricular ejection fraction, by estimation, is 30 to 35%. Left ventricular ejection fraction by 2D MOD biplane is 33.3 %. The left ventricle has moderately decreased function. The left ventricle demonstrates global hypokinesis. Left ventricular diastolic parameters are consistent with Grade I diastolic dysfunction (impaired relaxation).  3. Right ventricular systolic function was not well visualized. The right ventricular size is not well visualized.  4. The mitral valve was not well visualized. No evidence of mitral valve regurgitation.  5. The aortic valve was not well visualized. Aortic valve regurgitation is not visualized.  6. The inferior vena cava is normal in size with greater than 50% respiratory variability, suggesting right atrial pressure of 3 mmHg. Conclusion(s)/Recommendation(s): Unable to exclude left ventricular thrombus, would recommend a repeat transthoracic echocardiogram with contrast. FINDINGS  Left Ventricle: Apex could not be adequately evaluated to r/o thrombus. Left ventricular ejection fraction, by estimation, is 30 to 35%. Left ventricular ejection fraction by 2D MOD biplane is 33.3 %. The left ventricle has moderately  decreased function. The left ventricle demonstrates global hypokinesis. The left ventricular internal cavity size was  normal in size. There is no left ventricular hypertrophy. Left ventricular diastolic parameters are consistent with Grade I diastolic dysfunction (impaired relaxation). Indeterminate filling pressures. Right Ventricle: The right ventricular size is not well visualized. Right vetricular wall thickness was not well visualized. Right ventricular systolic function was not well visualized. Left Atrium: Left atrial size was normal in size. Right Atrium: Right atrial size was normal in size. Pericardium: There is no evidence of pericardial effusion. Mitral Valve: The mitral valve was not well visualized. No evidence of mitral valve regurgitation. Tricuspid Valve: The tricuspid valve is not well visualized. Tricuspid valve regurgitation is not demonstrated. Aortic Valve: The aortic valve was not well visualized. Aortic valve regurgitation is not visualized. Aortic valve mean gradient measures 2.0 mmHg. Aortic valve peak gradient measures 3.5 mmHg. Aortic valve area, by VTI measures 2.05 cm. Pulmonic Valve: The pulmonic valve was not well visualized. Pulmonic valve regurgitation is not visualized. Aorta: The aortic root and ascending aorta are structurally normal, with no evidence of dilitation. Venous: The inferior vena cava is normal in size with greater than 50% respiratory variability, suggesting right atrial pressure of 3 mmHg. IAS/Shunts: No atrial level shunt detected by color flow Doppler.  LEFT VENTRICLE PLAX 2D                        Biplane EF (MOD) LVIDd:         5.00 cm         LV Biplane EF:   Left LVIDs:         3.80 cm                          ventricular LV PW:         1.10 cm                          ejection LV IVS:        0.90 cm                          fraction by LVOT diam:     1.90 cm                          2D MOD LV SV:         37                               biplane is LV SV  Index:   16                               33.3 %. LVOT Area:     2.84 cm                                Diastology                                LV e' medial:    23.10 cm/s LV Volumes (MOD)               LV E/e' medial:  1.9 LV vol d, MOD  131.0 ml      LV e' lateral:   4.79 cm/s A2C:                           LV E/e' lateral: 9.3 LV vol d, MOD    141.0 ml A4C: LV vol s, MOD    85.9 ml A2C: LV vol s, MOD    95.1 ml A4C: LV SV MOD A2C:   45.1 ml LV SV MOD A4C:   141.0 ml LV SV MOD BP:    45.2 ml LEFT ATRIUM             Index        RIGHT ATRIUM           Index LA Vol (A2C):   47.9 ml 20.63 ml/m  RA Area:     11.40 cm LA Vol (A4C):   34.5 ml 14.86 ml/m  RA Volume:   20.20 ml  8.70 ml/m LA Biplane Vol: 44.6 ml 19.21 ml/m  AORTIC VALVE AV Area (Vmax):    2.08 cm AV Area (Vmean):   2.00 cm AV Area (VTI):     2.05 cm AV Vmax:           93.30 cm/s AV Vmean:          62.300 cm/s AV VTI:            0.181 m AV Peak Grad:      3.5 mmHg AV Mean Grad:      2.0 mmHg LVOT Vmax:         68.50 cm/s LVOT Vmean:        43.900 cm/s LVOT VTI:          0.131 m LVOT/AV VTI ratio: 0.72  AORTA Ao Root diam: 3.35 cm Ao Asc diam:  3.50 cm MITRAL VALVE MV Area (PHT): 4.74 cm     SHUNTS MV Decel Time: 160 msec     Systemic VTI:  0.13 m MV E velocity: 44.60 cm/s   Systemic Diam: 1.90 cm MV A velocity: 108.00 cm/s MV E/A ratio:  0.41 Lyman Bishop MD Electronically signed by Lyman Bishop MD Signature Date/Time: 01/23/2023/5:57:29 PM    Final     PHYSICAL EXAM  Temp:  [98 F (36.7 C)-98.9 F (37.2 C)] 98.9 F (37.2 C) (03/23 1155) Pulse Rate:  [78-94] 94 (03/23 1155) Resp:  [18-20] 18 (03/23 1155) BP: (112-153)/(77-87) 143/87 (03/23 1155) SpO2:  [95 %-100 %] 100 % (03/23 1155)  General - Well nourished, well developed, in no apparent distress. Cardiovascular - Regular rhythm and rate.  Mental Status -  Level of arousal and orientation to time, place, and person were intact. Language including expression, naming,  repetition, comprehension was assessed and found intact. Attention span and concentration were normal. Recent and remote memory were intact. Fund of Knowledge was assessed and was intact.  Cranial Nerves II - XII - II - Visual field intact OU. III, IV, VI - Extraocular movements intact. V - Facial sensation intact bilaterally. VII -slight right facial droop however does not have his top teeth and VIII - Hearing & vestibular intact bilaterally. X - Palate elevates symmetrically. XI - Chin turning & shoulder shrug intact bilaterally. XII - Tongue protrusion intact.  Motor Strength - The patient's strength was normal in all extremities and pronator drift was absent.  Bulk was normal and fasciculations were absent.   Motor Tone - Muscle tone was assessed at  the neck and appendages and was normal. Sensory - Light touch, temperature/pinprick were assessed and were symmetrical.    Coordination - The patient had normal movements in the hands and feet with no ataxia or dysmetria.  Tremor was absent.  Gait and Station - deferred.  ASSESSMENT/PLAN Mr. Ralph Short is a 77 y.o. male with history of  CAD, CKD stage III, diabetes mellitus type 2, IDDM, gout, HTN, history of systolic CHF presented to ED with symptoms of headaches, slurred speech which started 1 week ago.  Also has a history of prior stroke in the left hemisphere with no residual deficits that happened in 2018.  Presented to [facility] after 1 week of word finding difficulties and then continued to have some slurred speech and some difficulty walking  Stroke: Acute left MCA ischemic infarct, likely due to cardiomyopathy with low EF, and  LV thrombus Code Stroke  CT head acute left MCA ischemic infarct parenchymal atrophy, chronic small vessel ischemia disease and chronic infarcts CTA head & neck no LVO MRI acute left MCA posterior ischemic infarct 2D Echo EF 30 to 35% 2D echo with contrast 1.6 x .7 cm none mobile apical left  ventricular mural thrombus LDL 44 HgbA1c 6.0 UDS negative VTE prophylaxis -Lovenox Plavix prior to admission, now recommend Eliquis due to LV thrombus found,   Therapy recommendations: Outpatient OT Disposition: Pending  History of stroke  History of cardiomyopathy with apical mural thrombus EF of 25 to 30% in 2014 after MRI status post stenting, put on Coumadin 03/2017 admitted for right-sided weakness and aphasia.  CT no acute abnormality but old right frontal and left occipital infarcts.  MRI showed left frontal infarct.  MRI head and neck negative.  EF 25 to 30%.  LDL 56, A1c 8.7.  IR 0.97.  However repeat TEE showed no LV thrombus.  Coumadin discontinued and put on aspirin. Later on patient not tolerating aspirin, changed to Plavix.  Hypertension Home meds: Coreg 6.25 mg, losartan 25 mg Stable Long-term BP goal normotensive  Hyperlipidemia Home meds: Atorvastatin 80, resumed in hospital LDL 44, goal < 70 Continue statin at discharge  Diabetes type II Controlled Home meds: Glipizide, insulin HgbA1c pending, goal < 7.0 CBGs SSI Close PCP follow-up at the [place]  Other Stroke Risk Factors Advanced Age >/= 50  Obesity, Body mass index is 36.88 kg/m., BMI >/= 30 associated with increased stroke risk, recommend weight loss, diet and exercise as appropriate  Coronary artery disease/MI status post stenting  Other Active Problems CKD stage III, creatinine 1.31-1.25 Gout History of traumatic [facility] day # 2     No family at bedside.  Patient lying bed, currently neuro intact, no focal deficit.  However, patient does have left MCA infarct on CT and MRI.  CTA neck negative.  EF showed 30 to 35%, and repeat echo with and without contrast confirms LV thrombus   recommend Eliquis and statin.  Aggressive stroke factor modification.  Discussed with Dr. Posey Pronto and patient and answered questions.  Greater than 50% time during this 35-minute visit were spent in counseling and  coordination of care about his cardioembolic stroke and LV thrombus and answering questions.  Outpatient follow-up with cardiology [facility].      Antony Contras, MD Stroke Neurology 01/24/2023 1:43 PM    To contact Stroke Continuity provider, please refer to [email or web address] After hours, contact General Neurology

## 2023-01-24 NOTE — TOC Transition Note (Signed)
Transition of Care [facility]) - CM/SW Discharge Note   Patient Details  Name: Ralph Short MRN: [id number] Date of Birth: Jul 08, 1946  [facility] CM/SW Contact:  Carles Collet, RN Phone Number: 01/24/2023, 2:49 PM   Clinical Narrative:     Provided with Eliquis card, instructed on how to use and emphasized doses may not be skipped and he needs tofill it with the card today after discharge, not wait until Monday.   Final next level of care: Home/Self Care Barriers to Discharge: No Barriers Identified   Patient Goals and CMS Choice      Discharge Placement                         Discharge Plan and Services Additional resources added to the After Visit Summary for     Discharge Planning Services: CM Consult            DME Arranged: N/A DME Agency: NA       HH Arranged: NA          Social Determinants of Health (SDOH) Interventions SDOH Screenings   Food Insecurity: No Food Insecurity (01/23/2023)  Housing: Low Risk  (01/23/2023)  Transportation Needs: No Transportation Needs (01/23/2023)  Utilities: Not At Risk (01/23/2023)  Tobacco Use: Medium Risk (01/22/2023)     Readmission Risk Interventions     No data to display

## 2023-01-24 NOTE — Progress Notes (Signed)
Patient discharged out via wheelchair; accompanied home by his son.

## 2023-01-24 NOTE — Progress Notes (Signed)
ANTICOAGULATION CONSULT NOTE - Initial Consult  Pharmacy Consult for apixaban Indication: stroke  Allergies  Allergen Reactions   Penicillins Hives   Aspirin Palpitations    Patient Measurements: Height: 5\' 10"  (177.8 cm) Weight: 116.6 kg (257 lb 0.9 oz) IBW/kg (Calculated) : 73  Vital Signs: Temp: 98.9 F (37.2 C) (03/23 1155) Temp Source: Oral (03/23 1155) BP: 143/87 (03/23 1155) Pulse Rate: 94 (03/23 1155)  Labs: Recent Labs    01/22/23 0809 01/22/23 1455 01/24/23 0153  HGB 14.3 14.3 14.4  HCT 46.5 44.2 42.0  PLT 137* 143* 137*  APTT 24  --   --   LABPROT 13.7  --   --   INR 1.1  --   --   CREATININE 1.31* 1.25* 1.26*    Estimated Creatinine Clearance: 63.8 mL/min (A) (by C-G formula based on SCr of 1.26 mg/dL (H)).   Medical History: Past Medical History:  Diagnosis Date   CAD S/P percutaneous coronary angioplasty - PCI LAD LAD (Xience DES) to prox LAD and mid LAD 07/05/13 07/05/2013   CKD (chronic kidney disease) stage 3, GFR 30-59 ml/min ([place]) 07/06/2013   Diabetes mellitus without complication ([place])    Diverticulitis    Diverticulitis    DM (diabetes mellitus), type 2, uncontrolled, recently began insulin 07/05/2013   Gout 07/05/2013   History of agent Orange exposure 07/05/2013   Hypertension    LV dysfunction, s/p MI, 07/05/13 07/05/2013   Presence of drug coated stent in LAD coronary artery 07/05/2013   2 Xience Xpedition DES to prox & mid LAD -- in STEMI    STEMI (ST elevation myocardial infarction)of ANT wall-total occ. of LAD  07/05/2013    Medications:  Scheduled:   atorvastatin  80 mg Oral q1800   clopidogrel  75 mg Oral Daily   enoxaparin (LOVENOX) injection  30 mg Subcutaneous Q24H   insulin aspart  0-15 Units Subcutaneous TID WC   insulin aspart  0-5 Units Subcutaneous QHS   Infusions:  PRN: acetaminophen **OR** acetaminophen (TYLENOL) oral liquid 160 mg/5 mL **OR** acetaminophen  Assessment: Patient had acute left MCA ischemic infarct likely due  to cardiomyopathy with low EF and normal LV thrombus. Patient was on warfarin PTA but was discontinued when a repeat TEE showed no LV thrombus and was put on aspirin. Plan is to give aspirin, plavix, and apixaban.  Goal of Therapy:  Monitor platelets by anticoagulation protocol: Yes   Plan:  Initiate apixaban 5 mg BID Follow up CBC and LVT in the outpatient setting.  Jerilynn Birkenhead 01/24/2023,2:40 PM

## 2023-01-24 NOTE — Progress Notes (Signed)
Patient ready for discharge to home; discharge instructions given and reviewed; Rx given to patient for apixaban. Son is coming to the hospital to accompany patient home; will review coupon for the medication and f/u instructions.

## 2023-01-24 NOTE — Progress Notes (Signed)
  Echocardiogram 2D Echocardiogram has been performed.  Ralph Short 01/24/2023, 12:38 PM

## 2023-01-27 ENCOUNTER — Other Ambulatory Visit: Payer: Self-pay

## 2023-01-27 NOTE — Discharge Summary (Signed)
Physician Discharge Summary   Patient: Ralph Short MRN: [id number] DOB: 1946-03-18  Admit date:     01/22/2023  Discharge date: 01/24/2023  Discharge Physician: Berle Mull  PCP: [facility]  Recommendations at discharge[facility] Follow-up with neurology.   Follow-up Information     Cl[facility], [place]. Schedule an appointment as soon as possible for a visit in 1 week(s).   Contact information: [place] [address] [id number]         Garvin Fila, MD. Call.   Specialties: Neurology, Radiology Why: As needed Contact information: [address] [place] [address] [phone]                Discharge Diagnoses: Principal Problem:   Acute CVA (cerebrovascular accident) [facility]) Active Problems:   CKD (chronic kidney disease) stage 3, GFR 30-59 ml/min (HCC)   CAD S/P- PCI  (Xience DES) to prox LAD and mid LAD 07/05/13   Diabetes mellitus with complication [facility])  Hospital Course: Ralph Short is a 77 y.o. male with PMH significant of CAD, CKD stage III, diabetes mellitus type 2, IDDM, gout, HTN, history of systolic CHF presented to ED with symptoms of headaches, slurred speech since Thursday, 01/22/2023.  Found to have an acute CVA.  Neurology was consulted.  Currently awaiting echocardiogram with contrast to rule out apical thrombus. 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 Presents with complaints of headache and slurred speech. Speech appears to have improved. CT head shows evidence of acute left MCA infarct. MRI brain confirms acute left MCA infarct without any hemorrhage. CTA head and neck without any large vessel occlusion. Echocardiogram shows improving EF of 35% but unable to rule out an apical thrombus. LDL 44.  Hemoglobin A1c 6.0. Initially was not agreeable for further workup or therapy changes but now agree to further workup as well  as any change in his medications. Neurology recommended to initiate Eliquis for apical thrombus. PT OT recommends outpatient OT.   HTN. Blood pressure improving. On losartan and Coreg at home. Monitor for now.   History of apical mural thrombus. Chronic systolic CHF. No volume overload right now. Will resume Coreg and losartan at the time of the discharge. EF appears to be stable or improving. Was on Coumadin in the past but currently off of anticoagulation. Repeat echo card with contrast shows evidence of apical thrombus still present. Neurology recommended Eliquis. Given history of CAD will continue Plavix as well.   HLD. On statin at home.  Continue.   Type 2 diabetes mellitus, well-controlled without any hypoglycemia with long-term insulin use. Home diabetes regimen include insulin and glipizide. Hemoglobin A1c 6.0. Monitor.   Obesity. Body mass index is 36.88 kg/m.  Placing the patient at high risk for poor outcome.   CAD. On Plavix. Monitor.   CKD stage IIIa Baseline serum creatinine appears to be around 1.5. Current serum creatinine 1.3. Monitor.   Consultants:  Neurology  Procedures performed:  Echocardiogram  DISCHARGE MEDICATION: Allergies as of 01/24/2023       Reactions   Penicillins Hives   Aspirin Palpitations        Medication List     TAKE these medications    acetaminophen 500 MG tablet Commonly known as: TYLENOL Take 500 mg by mouth 4 (four) times daily.   allopurinol 100 MG tablet Commonly known as: ZYLOPRIM Take 100 mg by mouth daily.   apixaban 5 MG  Tabs tablet Commonly known as: ELIQUIS Take 1 tablet (5 mg total) by mouth 2 (two) times daily.   ARTIFICIAL TEAR OP Place 1 drop into both eyes daily as needed (for dry eyes).   atorvastatin 80 MG tablet Commonly known as: LIPITOR Take 1 tablet (80 mg total) by mouth daily at 6 PM.   carvedilol 6.25 MG tablet Commonly known as: COREG Take 6.25 mg by mouth 2 (two) times  daily with a meal.   clopidogrel 75 MG tablet Commonly known as: PLAVIX Take 1 tablet (75 mg total) by mouth daily.   DULoxetine 20 MG capsule Commonly known as: CYMBALTA Take 20 mg by mouth daily.   glipiZIDE 10 MG tablet Commonly known as: GLUCOTROL Take 10 mg by mouth 2 (two) times daily before a meal.   insulin glargine 100 UNIT/ML injection Commonly known as: LANTUS Inject 50 Units into the skin at bedtime.   losartan 25 MG tablet Commonly known as: COZAAR Take 25 mg by mouth daily.   meclizine 25 MG tablet Commonly known as: ANTIVERT Take 1 tablet (25 mg total) by mouth 3 (three) times daily as needed for dizziness.   nitroGLYCERIN 0.4 MG SL tablet Commonly known as: NITROSTAT Place 1 tablet (0.4 mg total) under the tongue every 5 (five) minutes as needed for chest pain.   potassium chloride SA 20 MEQ tablet Commonly known as: KLOR-CON M Take 1 tablet (20 mEq total) by mouth daily.       Disposition: Home Diet recommendation: Cardiac diet  Discharge Exam: Vitals:   01/24/23 0322 01/24/23 0719 01/24/23 1155 01/24/23 1500  BP: 125/84 (!) 149/84 (!) 143/87 (!) 151/80  Pulse: 83 78 94 85  Resp:  20 18 18   Temp: 98 F (36.7 C) 98.4 F (36.9 C) 98.9 F (37.2 C) 99.3 F (37.4 C)  TempSrc: Oral Oral Oral Oral  SpO2: 97% 98% 100% 96%  Weight:      Height:       General: Appear in no distress; no visible Abnormal Neck Mass Or lumps, Conjunctiva normal Cardiovascular: S1 and S2 Present, no Murmur, Respiratory: good respiratory effort, Bilateral Air entry present and CTA,  Crackles, no wheezes Abdomen: Bowel Sound present, Non tender  Extremities: no Pedal edema Neurology: alert and oriented to time, place, and person  [facility] Weights   01/22/23 0744  Weight: 116.6 kg   Condition at discharge: stable  The results of significant diagnostics from this hospitalization (including imaging, microbiology, ancillary and laboratory) are listed below for reference.    Imaging Studies: ECHOCARDIOGRAM LIMITED  Result Date: 01/24/2023    ECHOCARDIOGRAM LIMITED REPORT   Patient Name:   Ralph Short Date of Exam: 01/24/2023 Medical Rec #:  [id number]      Height:       70.0 in Accession #:    [id number]     Weight:       257.1 lb Date of Birth:  1946/10/24      BSA:          2.322 m Patient Age:    1 years       BP:           149/84 mmHg Patient Gender: M              HR:           84 bpm. Exam Location:  Inpatient Procedure: Limited Echo and Intracardiac Opacification Agent Indications:    left ventricular apical thrombus  History:  Patient has prior history of Echocardiogram examinations, most                 recent 01/23/2023. Cardiomyopathy, CAD, Stroke and chronic kidney                 disease; Risk Factors:Diabetes and Hypertension.  Sonographer:    Johny Chess RDCS Referring Phys: [id number] [place]  1. Definity contrast utilized. Nonmobile apical LV mural thrombus present measuring 1.6 x 0.7 cm.. Left ventricular ejection fraction, by estimation, is 30 to 35%. The left ventricle has moderately decreased function. The left ventricle demonstrates regional wall motion abnormalities (see scoring diagram/findings for description). FINDINGS  Left Ventricle: Definity contrast utilized. Nonmobile apical LV mural thrombus present measuring 1.6 x 0.7 cm. Left ventricular ejection fraction, by estimation, is 30 to 35%. The left ventricle has moderately decreased function. The left ventricle demonstrates regional wall motion abnormalities. Definity contrast agent was given IV to delineate the left ventricular endocardial borders.  LV Wall Scoring: The apical lateral segment, apical septal segment, apical inferior segment, and apex are akinetic. The entire anterior wall, antero-lateral wall, anterior septum, inferior wall, posterior wall, mid inferoseptal segment, and basal inferoseptal segment are hypokinetic. Rozann Lesches MD Electronically signed by  Rozann Lesches MD Signature Date/Time: 01/24/2023/12:43:35 PM    Final    ECHOCARDIOGRAM COMPLETE  Result Date: 01/23/2023    ECHOCARDIOGRAM REPORT   Patient Name:   Ferd Hibbs Date of Exam: 01/23/2023 Medical Rec #:  [id number]      Height:       70.0 in Accession #:    [id number]     Weight:       257.1 lb Date of Birth:  04-27-1946      BSA:          2.322 m Patient Age:    35 years       BP:           153/87 mmHg Patient Gender: M              HR:           84 bpm. Exam Location:  Inpatient Procedure: 2D Echo, Cardiac Doppler and Color Doppler Indications:    Stroke  History:        Patient has prior history of Echocardiogram examinations, most                 recent 03/04/2017. CAD; Risk Factors:Diabetes.  Sonographer:    Marella Chimes Referring Phys: [id number] RIPUDEEP K RAI  Sonographer Comments: Suboptimal parasternal window and patient is obese. Image acquisition challenging due to patient body habitus. IMPRESSIONS  1. Limited echo windows- Definity contrast would be helpful for better endocardial definition.  2. Apex could not be adequately evaluated to r/o thrombus. Left ventricular ejection fraction, by estimation, is 30 to 35%. Left ventricular ejection fraction by 2D MOD biplane is 33.3 %. The left ventricle has moderately decreased function. The left ventricle demonstrates global hypokinesis. Left ventricular diastolic parameters are consistent with Grade I diastolic dysfunction (impaired relaxation).  3. Right ventricular systolic function was not well visualized. The right ventricular size is not well visualized.  4. The mitral valve was not well visualized. No evidence of mitral valve regurgitation.  5. The aortic valve was not well visualized. Aortic valve regurgitation is not visualized.  6. The inferior vena cava is normal in size with greater than 50% respiratory variability, suggesting right atrial pressure of 3 mmHg. Conclusion(s)/Recommendation(s):  Unable to exclude left ventricular thrombus,  would recommend a repeat transthoracic echocardiogram with contrast. FINDINGS  Left Ventricle: Apex could not be adequately evaluated to r/o thrombus. Left ventricular ejection fraction, by estimation, is 30 to 35%. Left ventricular ejection fraction by 2D MOD biplane is 33.3 %. The left ventricle has moderately decreased function. The left ventricle demonstrates global hypokinesis. The left ventricular internal cavity size was normal in size. There is no left ventricular hypertrophy. Left ventricular diastolic parameters are consistent with Grade I diastolic dysfunction (impaired relaxation). Indeterminate filling pressures. Right Ventricle: The right ventricular size is not well visualized. Right vetricular wall thickness was not well visualized. Right ventricular systolic function was not well visualized. Left Atrium: Left atrial size was normal in size. Right Atrium: Right atrial size was normal in size. Pericardium: There is no evidence of pericardial effusion. Mitral Valve: The mitral valve was not well visualized. No evidence of mitral valve regurgitation. Tricuspid Valve: The tricuspid valve is not well visualized. Tricuspid valve regurgitation is not demonstrated. Aortic Valve: The aortic valve was not well visualized. Aortic valve regurgitation is not visualized. Aortic valve mean gradient measures 2.0 mmHg. Aortic valve peak gradient measures 3.5 mmHg. Aortic valve area, by VTI measures 2.05 cm. Pulmonic Valve: The pulmonic valve was not well visualized. Pulmonic valve regurgitation is not visualized. Aorta: The aortic root and ascending aorta are structurally normal, with no evidence of dilitation. Venous: The inferior vena cava is normal in size with greater than 50% respiratory variability, suggesting right atrial pressure of 3 mmHg. IAS/Shunts: No atrial level shunt detected by color flow Doppler.  LEFT VENTRICLE PLAX 2D                        Biplane EF (MOD) LVIDd:         5.00 cm         LV  Biplane EF:   Left LVIDs:         3.80 cm                          ventricular LV PW:         1.10 cm                          ejection LV IVS:        0.90 cm                          fraction by LVOT diam:     1.90 cm                          2D MOD LV SV:         37                               biplane is LV SV Index:   16                               33.3 %. LVOT Area:     2.84 cm  Diastology                                LV e' medial:    23.10 cm/s LV Volumes (MOD)               LV E/e' medial:  1.9 LV vol d, MOD    131.0 ml      LV e' lateral:   4.79 cm/s A2C:                           LV E/e' lateral: 9.3 LV vol d, MOD    141.0 ml A4C: LV vol s, MOD    85.9 ml A2C: LV vol s, MOD    95.1 ml A4C: LV SV MOD A2C:   45.1 ml LV SV MOD A4C:   141.0 ml LV SV MOD BP:    45.2 ml LEFT ATRIUM             Index        RIGHT ATRIUM           Index LA Vol (A2C):   47.9 ml 20.63 ml/m  RA Area:     11.40 cm LA Vol (A4C):   34.5 ml 14.86 ml/m  RA Volume:   20.20 ml  8.70 ml/m LA Biplane Vol: 44.6 ml 19.21 ml/m  AORTIC VALVE AV Area (Vmax):    2.08 cm AV Area (Vmean):   2.00 cm AV Area (VTI):     2.05 cm AV Vmax:           93.30 cm/s AV Vmean:          62.300 cm/s AV VTI:            0.181 m AV Peak Grad:      3.5 mmHg AV Mean Grad:      2.0 mmHg LVOT Vmax:         68.50 cm/s LVOT Vmean:        43.900 cm/s LVOT VTI:          0.131 m LVOT/AV VTI ratio: 0.72  AORTA Ao Root diam: 3.35 cm Ao Asc diam:  3.50 cm MITRAL VALVE MV Area (PHT): 4.74 cm     SHUNTS MV Decel Time: 160 msec     Systemic VTI:  0.13 m MV E velocity: 44.60 cm/s   Systemic Diam: 1.90 cm MV A velocity: 108.00 cm/s MV E/A ratio:  0.41 Lyman Bishop MD Electronically signed by Lyman Bishop MD Signature Date/Time: 01/23/2023/5:57:29 PM    Final    CT ANGIO HEAD NECK W WO CM  Result Date: 01/22/2023 CLINICAL DATA:  Follow-up examination for stroke. EXAM: CT ANGIOGRAPHY HEAD AND NECK TECHNIQUE: Multidetector CT imaging of  the head and neck was performed using the standard protocol during bolus administration of intravenous contrast. Multiplanar CT image reconstructions and MIPs were obtained to evaluate the vascular anatomy. Carotid stenosis measurements (when applicable) are obtained utilizing NASCET criteria, using the distal internal carotid diameter as the denominator. RADIATION DOSE REDUCTION: This exam was performed according to the departmental dose-optimization program which includes automated exposure control, adjustment of the mA and/or kV according to patient size and/or use of iterative reconstruction technique. CONTRAST:  74mL OMNIPAQUE IOHEXOL 350 MG/ML SOLN COMPARISON:  Prior studies from earlier the same day. FINDINGS: CT HEAD FINDINGS Brain: Age-related cerebral atrophy. Multifocal chronic cortical infarcts noted, better  seen on prior brain MRI. Evolving acute left posterior MCA distribution infarct with associated petechial blood products again noted, stable from prior MRI. No significant mass effect. No other acute intracranial hemorrhage or large vessel territory infarct. No mass lesion or midline shift. No hydrocephalus or extra-axial fluid collection. Vascular: No abnormal hyperdense vessel. Skull: Scalp soft tissues and calvarium demonstrate no new finding. Sinuses/Orbits: Globes and orbital soft tissues within normal limits. Paranasal sinuses and mastoid air cells remain clear. Other: None. Review of the MIP images confirms the above findings CTA NECK FINDINGS Aortic arch: Visualized aortic arch normal in caliber. Bovine branching pattern noted. No stenosis about the origin of the great vessels. Right carotid system: Right common and internal carotid arteries are patent without stenosis, dissection or occlusion. Left carotid system: Left common and internal carotid arteries are patent without dissection. Minor atheromatous change about the left carotid bulb without stenosis. Vertebral arteries: Both vertebral  arteries arise from the subclavian arteries. No proximal subclavian artery stenosis. Both vertebral arteries widely patent without stenosis, dissection or occlusion. Skeleton: No discrete or worrisome osseous lesions. 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 Other neck: Small amount of secretions noted within the subglottic airway. No other acute soft tissue abnormality within the neck. Upper chest: Visualized upper chest demonstrates no acute finding. Review of the MIP images confirms the above findings CTA HEAD FINDINGS Anterior circulation: Both internal carotid arteries are widely patent to the termini without stenosis. A1 segments, anterior communicating complex common anterior cerebral arteries are widely patent without stenosis. No M1 stenosis or occlusion. No proximal MCA branch occlusion or high-grade stenosis. Distal MCA branches are perfused and symmetric. Posterior circulation: Both V4 segments widely patent without stenosis. Left PICA patent. Right PICA origin not well seen. Basilar widely patent without stenosis. Superior cerebellar and posterior cerebral arteries patent bilaterally. Venous sinuses: Grossly patent allowing for timing the contrast bolus. Anatomic variants: None significant.  No aneurysm. Review of the MIP images confirms the above findings IMPRESSION: CT HEAD IMPRESSION: 1. Evolving acute left posterior MCA distribution infarct with associated petechial blood products, stable from prior MRI. No significant mass effect. 2. No other new acute intracranial abnormality. 3. Underlying atrophy with multiple chronic ischemic infarcts, better seen on prior brain MRI. CTA HEAD AND NECK IMPRESSION: 1. Negative CTA for large vessel occlusion or other emergent finding. 2. Minimal atheromatous change about the left carotid bulb without stenosis. Otherwise wide patency of the major arterial vasculature of the head and neck. No other significant atheromatous disease. No hemodynamically  significant or correctable stenosis. Electronically Signed   By: Jeannine Boga M.D.   On: 01/22/2023 21:09   MR BRAIN WO CONTRAST  Result Date: 01/22/2023 CLINICAL DATA:  Neuro deficit, acute, stroke suspected EXAM: MRI HEAD WITHOUT CONTRAST TECHNIQUE: Multiplanar, multiecho pulse sequences of the brain and surrounding structures were obtained without intravenous contrast. COMPARISON:  CT head from the same day FINDINGS: Brain: Acute left posterior MCA territory infarct. Associated edema without significant mass effect. No midline shift. Mild associated petechial hemorrhage. No mass occupying acute hemorrhage. Adjacent/anterior remote left MCA territory infarct. No hydrocephalus. Remote right frontal encephalomalacia. Cerebral atrophy. Vascular: Major arterial flow voids are maintained at the skull base. Skull and upper cervical spine: Normal marrow signal. Sinuses/Orbits: Clear sinuses.  No acute orbital findings. Other: No mastoid effusions. IMPRESSION: 1. Acute left posterior MCA territory infarct. Mild associated petechial hemorrhage. 2. Adjacent/anterior remote left MCA territory and remote right frontal encephalomalacia. Electronically Signed   By:  Margaretha Sheffield M.D.   On: 01/22/2023 10:09   CT Head Wo Contrast  Addendum Date: 01/22/2023   ADDENDUM REPORT: 01/22/2023 09:50 ADDENDUM: Impression #1 called by telephone at the time of interpretation on 01/22/2023 at 9:50 am to provider [facility] , who verbally acknowledged these results. Electronically Signed   By: Kellie Simmering D.O.   On: 01/22/2023 09:50   Result Date: 01/22/2023 CLINICAL DATA:  Provided history: Neuro deficit, acute, stroke suspected. Weakness. Confusion. Severe headache. Disorientation. EXAM: CT HEAD WITHOUT CONTRAST TECHNIQUE: Contiguous axial images were obtained from the base of the skull through the vertex without intravenous contrast. RADIATION DOSE REDUCTION: This exam was performed according to the departmental  dose-optimization program which includes automated exposure control, adjustment of the mA and/or kV according to patient size and/or use of iterative reconstruction technique. COMPARISON:  Brain MRI 03/03/2017. Head CT 03/08/2015 FINDINGS: Brain: Mild generalized parenchymal atrophy. Redemonstrated small chronic cortical/subcortical infarcts within the right frontoparietal lobes and bilateral occipital lobes. Known moderate-sized chronic cortical/subcortical left MCA vascular territory (within the left frontal and parietal lobes). New from the prior brain MRI of 03/03/2017, there is a moderate-sized left MCA territory cortical/subcortical infarct within the left temporal and parietal lobes (for instance as seen on series 2, image 20). This is favored acute/subacute. No significant mass effect. No evidence of hemorrhagic conversion. Redemonstrated focus of chronic encephalomalacia/gliosis within the anterior right frontal lobe, likely posttraumatic in etiology. Background mild patchy and ill-defined hypoattenuation within the cerebral white matter, nonspecific but compatible with chronic small vessel ischemic disease. No extra-axial fluid collection. No evidence of an intracranial mass. No midline shift. Vascular: No hyperdense vessel.  Atherosclerotic calcifications. Skull: No fracture or aggressive osseous lesion. Sinuses/Orbits: No mass or acute finding within the imaged orbits. No significant paranasal sinus disease at the imaged levels. Attempts are being made to reach the ordering provider at this time. IMPRESSION: 1. Moderate-sized left MCA territory cortical/subcortical infarct within the left temporal and parietal lobes, new from the prior brain MRI of 03/03/2017 and favored acute/subacute. Consider a brain MRI for further evaluation. 2. Background parenchymal atrophy, chronic small vessel ischemic disease and chronic infarcts as described. 3. Redemonstrated focus of chronic encephalomalacia/gliosis within  the anterior right frontal lobe, likely posttraumatic in etiology. Electronically Signed: By: Kellie Simmering D.O. On: 01/22/2023 09:43    Microbiology: Results for orders placed or performed during the hospital encounter of 07/05/13  MRSA PCR Screening     Status: None   Collection Time: 07/05/13 10:40 AM   Specimen: Nasal Mucosa; Nasopharyngeal  Result Value Ref Range Status   MRSA by PCR NEGATIVE NEGATIVE Final    Comment:        The GeneXpert MRSA Assay (FDA approved for NASAL specimens only), is one component of a comprehensive MRSA colonization surveillance program. It is not intended to diagnose MRSA infection nor to guide or monitor treatment for MRSA infections.   Labs: CBC: Recent Labs  Lab 01/22/23 0809 01/22/23 1455 01/24/23 0153  WBC 4.9 5.3 4.6  NEUTROABS 2.4  --   --   HGB 14.3 14.3 14.4  HCT 46.5 44.2 42.0  MCV 107.1* 99.8 99.3  PLT 137* 143* 0000000*   Basic Metabolic Panel: Recent Labs  Lab 01/22/23 0809 01/22/23 1455 01/24/23 0153  NA 138  --  137  K 4.7  --  4.3  CL 109  --  104  CO2 22  --  24  GLUCOSE 168*  --  134*  BUN  16  --  17  CREATININE 1.31* 1.25* 1.26*  CALCIUM 8.8*  --  8.9   Liver Function Tests: Recent Labs  Lab 01/22/23 0809  AST 28  ALT 30  ALKPHOS 88  BILITOT 0.5  PROT 6.8  ALBUMIN 3.9   CBG: Recent Labs  Lab 01/23/23 1217 01/23/23 1652 01/23/23 2105 01/24/23 0559 01/24/23 1153  GLUCAP 119* 99 149* 119* 106*    Discharge time spent: greater than 30 minutes.  Signed: Berle Mull, MD [facility] 01/24/2023
